# Patient Record
Sex: Female | Born: 1955
Health system: Southern US, Community
[De-identification: ages and names within clinical notes are randomized; demographics above are authoritative.]

## PROBLEM LIST (undated history)

## (undated) DIAGNOSIS — E119 Type 2 diabetes mellitus without complications: Secondary | ICD-10-CM

---

## 2015-04-30 ENCOUNTER — Encounter (HOSPITAL_COMMUNITY): Payer: Self-pay | Admitting: *Deleted

## 2015-04-30 ENCOUNTER — Emergency Department (HOSPITAL_COMMUNITY)
Admission: EM | Admit: 2015-04-30 | Discharge: 2015-04-30 | Disposition: A | Discharge status: Home / Self Care | Payer: 59 | Attending: Emergency Medicine | Admitting: Emergency Medicine

## 2015-04-30 ENCOUNTER — Emergency Department (HOSPITAL_COMMUNITY): Payer: 59

## 2015-04-30 DIAGNOSIS — E119 Type 2 diabetes mellitus without complications: Secondary | ICD-10-CM | POA: Diagnosis not present

## 2015-04-30 DIAGNOSIS — X58XXXA Exposure to other specified factors, initial encounter: Secondary | ICD-10-CM | POA: Insufficient documentation

## 2015-04-30 DIAGNOSIS — R197 Diarrhea, unspecified: Secondary | ICD-10-CM | POA: Diagnosis not present

## 2015-04-30 DIAGNOSIS — T383X5A Adverse effect of insulin and oral hypoglycemic [antidiabetic] drugs, initial encounter: Secondary | ICD-10-CM | POA: Diagnosis not present

## 2015-04-30 DIAGNOSIS — T50905A Adverse effect of unspecified drugs, medicaments and biological substances, initial encounter: Secondary | ICD-10-CM

## 2015-04-30 DIAGNOSIS — R531 Weakness: Secondary | ICD-10-CM | POA: Diagnosis present

## 2015-04-30 DIAGNOSIS — E860 Dehydration: Secondary | ICD-10-CM

## 2015-04-30 HISTORY — DX: Type 2 diabetes mellitus without complications: E11.9

## 2015-04-30 LAB — CBC
HCT: 37.2 % (ref 36.0–46.0)
HEMOGLOBIN: 12 g/dL (ref 12.0–15.0)
MCH: 23.3 pg — AB (ref 26.0–34.0)
MCHC: 32.3 g/dL (ref 30.0–36.0)
MCV: 72.2 fL — ABNORMAL LOW (ref 78.0–100.0)
PLATELETS: 316 10*3/uL (ref 150–400)
RBC: 5.15 MIL/uL — AB (ref 3.87–5.11)
RDW: 12.8 % (ref 11.5–15.5)
WBC: 11.6 10*3/uL — AB (ref 4.0–10.5)

## 2015-04-30 LAB — COMPREHENSIVE METABOLIC PANEL
ALBUMIN: 3.4 g/dL — AB (ref 3.5–5.0)
ALT: 21 U/L (ref 14–54)
ANION GAP: 10 (ref 5–15)
AST: 25 U/L (ref 15–41)
Alkaline Phosphatase: 66 U/L (ref 38–126)
BILIRUBIN TOTAL: 1 mg/dL (ref 0.3–1.2)
BUN: 7 mg/dL (ref 6–20)
CHLORIDE: 99 mmol/L — AB (ref 101–111)
CO2: 23 mmol/L (ref 22–32)
Calcium: 9.1 mg/dL (ref 8.9–10.3)
Creatinine, Ser: 0.57 mg/dL (ref 0.44–1.00)
GFR calc Af Amer: >60 mL/min (ref >60)
GFR calc non Af Amer: >60 mL/min (ref >60)
GLUCOSE: 219 mg/dL — AB (ref 65–99)
POTASSIUM: 3.8 mmol/L (ref 3.5–5.1)
SODIUM: 132 mmol/L — AB (ref 135–145)
TOTAL PROTEIN: 7.8 g/dL (ref 6.5–8.1)

## 2015-04-30 LAB — URINALYSIS, ROUTINE W REFLEX MICROSCOPIC
Bilirubin Urine: NEGATIVE
GLUCOSE, UA: NEGATIVE mg/dL
Hgb urine dipstick: NEGATIVE
KETONES UR: NEGATIVE mg/dL
NITRITE: NEGATIVE
PROTEIN: NEGATIVE mg/dL
Specific Gravity, Urine: 1.019 (ref 1.005–1.030)
UROBILINOGEN UA: 0.2 mg/dL (ref 0.0–1.0)
pH: 6 (ref 5.0–8.0)

## 2015-04-30 LAB — I-STAT CREATININE, ED: Creatinine, Ser: 0.7 mg/dL (ref 0.44–1.00)

## 2015-04-30 LAB — I-STAT CG4 LACTIC ACID, ED
LACTIC ACID, VENOUS: 1.01 mmol/L (ref 0.5–2.0)
Lactic Acid, Venous: 1.31 mmol/L (ref 0.5–2.0)

## 2015-04-30 LAB — CBG MONITORING, ED: GLUCOSE-CAPILLARY: 213 mg/dL — AB (ref 65–99)

## 2015-04-30 LAB — URINE MICROSCOPIC-ADD ON

## 2015-04-30 MED ORDER — SODIUM CHLORIDE 0.9 % IV BOLUS (SEPSIS)
1000.0000 mL | Freq: Once | INTRAVENOUS | Status: AC
  Administered 2015-04-30: 1000 mL via INTRAVENOUS

## 2015-04-30 NOTE — ED Notes (Signed)
MD at bedside. 

## 2015-04-30 NOTE — ED Provider Notes (Signed)
CSN: [id number]     Arrival date & time 04/30/15  1129 History   First MD Initiated Contact with Patient 04/30/15 1238     Chief Complaint  Patient presents with  . Weakness  . Dizziness     (Consider location/radiation/quality/duration/timing/severity/associated sxs/prior Treatment) Patient is a 59 y.o. female presenting with weakness. The history is provided by a relative. The history is limited by the condition of the patient.  Weakness This is a new problem. The current episode started in the past 7 days. The problem occurs constantly. The problem has been unchanged. Associated symptoms include a change in bowel habit, fatigue and weakness. Pertinent negatives include no abdominal pain, anorexia, arthralgias, chest pain, chills, congestion, coughing, diaphoresis, fever, headaches, joint swelling, myalgias, nausea, neck pain, numbness, rash, sore throat, swollen glands, urinary symptoms, vertigo, visual change or vomiting. The symptoms are aggravated by exertion. She has tried rest for the symptoms. The treatment provided no relief.    Past Medical History  Diagnosis Date  . Diabetes mellitus without complication (HCC)    History reviewed. No pertinent past surgical history. No family history on file. Social History  Substance Use Topics  . Smoking status: Never Smoker   . Smokeless tobacco: None  . Alcohol Use: No   OB History    No data available     Review of Systems  Constitutional: Positive for fatigue. Negative for fever, chills and diaphoresis.  HENT: Negative for congestion, facial swelling and sore throat.   Respiratory: Negative for cough and shortness of breath.   Cardiovascular: Negative for chest pain.  Gastrointestinal: Positive for diarrhea and change in bowel habit. Negative for nausea, vomiting, abdominal pain, constipation, blood in stool, abdominal distention, anal bleeding and anorexia.  Genitourinary: Negative for dysuria.  Musculoskeletal: Negative for  myalgias, back pain, joint swelling, arthralgias and neck pain.  Skin: Negative for rash.  Neurological: Positive for dizziness, weakness and light-headedness. Negative for vertigo, numbness and headaches.  Psychiatric/Behavioral: Negative for confusion.      Allergies  Review of patient's allergies indicates no known allergies.  Home Medications   Prior to Admission medications   Not on File   BP 143/65 mmHg  Pulse 77  Temp(Src) 98.2 F (36.8 C) (Oral)  Resp 16  Wt 99 lb 8 oz (45.133 kg)  SpO2 99% Physical Exam  Constitutional: She is oriented to person, place, and time. She appears well-developed and well-nourished. No distress.  HENT:  Head: Normocephalic and atraumatic.  Right Ear: External ear normal.  Left Ear: External ear normal.  Nose: Nose normal.  Mouth/Throat: Oropharynx is clear and moist. No oropharyngeal exudate.  Eyes: Conjunctivae and EOM are normal. Pupils are equal, round, and reactive to light. Right eye exhibits no discharge. Left eye exhibits no discharge. No scleral icterus.  Neck: Normal range of motion. Neck supple. No JVD present. No tracheal deviation present. No thyromegaly present.  Cardiovascular: Normal rate, regular rhythm and intact distal pulses.   Pulmonary/Chest: Effort normal and breath sounds normal. No stridor. No respiratory distress. She has no wheezes. She has no rales. She exhibits no tenderness.  Abdominal: Soft. She exhibits no distension. There is no tenderness.  Musculoskeletal: Normal range of motion. She exhibits no edema or tenderness.  Lymphadenopathy:    She has no cervical adenopathy.  Neurological: She is alert and oriented to person, place, and time.  Skin: Skin is warm and dry. No rash noted. She is not diaphoretic. No erythema. There is pallor.  Psychiatric: She  has a normal mood and affect. Her behavior is normal. Judgment and thought content normal.  Nursing note and vitals reviewed.   ED Course  Procedures  (including critical care time) Labs Review Labs Reviewed  CBC - Abnormal; Notable for the following:    WBC 11.6 (*)    RBC 5.15 (*)    MCV 72.2 (*)    MCH 23.3 (*)    All other components within normal limits  URINALYSIS, ROUTINE W REFLEX MICROSCOPIC (NOT AT [facility]) - Abnormal; Notable for the following:    Color, Urine AMBER (*)    APPearance CLOUDY (*)    Leukocytes, UA MODERATE (*)    All other components within normal limits  COMPREHENSIVE METABOLIC PANEL - Abnormal; Notable for the following:    Sodium 132 (*)    Chloride 99 (*)    Glucose, Bld 219 (*)    Albumin 3.4 (*)    All other components within normal limits  URINE MICROSCOPIC-ADD ON - Abnormal; Notable for the following:    Squamous Epithelial / LPF FEW (*)    All other components within normal limits  CBG MONITORING, ED - Abnormal; Notable for the following:    Glucose-Capillary 213 (*)    All other components within normal limits  I-STAT CREATININE, ED  I-STAT CG4 LACTIC ACID, ED  I-STAT CG4 LACTIC ACID, ED    Imaging Review Dg Chest Portable 1 View  04/30/2015  CLINICAL DATA:  Weakness and dizziness for 4 days EXAM: PORTABLE CHEST 1 VIEW COMPARISON:  None. FINDINGS: Normal mediastinum and cardiac silhouette. Normal pulmonary vasculature. Subtle airspace opacity over the LEFT hemidiaphragm and partially obscuring the LEFT heart border. No pleural fluid. No pneumothorax. No pulmonary edema. IMPRESSION: LEFT upper lobe lingular opacity representing atelectasis versus early / small infiltrate. Electronically Signed   By: Genevive Bi M.D.   On: 04/30/2015 13:27   I have personally reviewed and evaluated these images and lab results as part of my medical decision-making.   EKG Interpretation   Date/Time:  Sunday April 30 2015 12:11:35 EDT Ventricular Rate:  79 PR Interval:  140 QRS Duration: 76 QT Interval:  394 QTC Calculation: 451 R Axis:   87 Text Interpretation:  Normal sinus rhythm Normal ECG No  old tracing to  compare Confirmed by GOLDSTON  MD, SCOTT ([id number]) on 04/30/2015 1:13:00 PM      MDM   Final diagnoses:  Dehydration  Diarrhea, unspecified type  Adverse effects of medication, initial encounter   Patient presenting for generalized weakness and fatigue and ongoing for the past 4-5 days. Patient recently started metformin. She eats mostly starches per family. Patient has had multiple episodes of diarrhea since starting metformin. Also has not felt like eating or drinking very much. Laboratory workup was obtained to look for metabolic or infectious abnormalities. Patient has not had any other localizing symptoms such as abdominal pain chest pain dysuria or other specifics to account for her symptoms. Patient also has not had any melena or signs of acute GI bleeding. Also no evidence of vertigo. Negative Dicks Hallpike exam. No focal neurologic deficits.  Patient was given IV fluids laboratory workup showslimited findings to account for her symptoms however she does feel better after getting IV fluid. I suspect she has a component of dehydration due to some adverse effects of her metformin combined with primarily a starchy diet of rice. Advised patient and family to have the patient stop the metformin at least for now until she can be  seen by her primary physician. Suggested dietary changes which may help. Advised for her to have a more varied diet mostly liquids for now fits all she can tolerate but to have things other than starch if her blood sugars have been a concern and if she needs to be on metformin. Advised family and patient to return promptly if she feels worse. Patient and family expressed understanding follow up plan and return precautions. All questions answered prior to discharge. Patient was discharged stable condition with family.   Patient care was discussed with my attending, Dr. Criss AlvineGoldston.     Gavin PoundJustin Kal Chait, MD 05/01/15 [id number]  Pricilla LovelessScott Goldston, MD 05/04/15 [phone]

## 2015-04-30 NOTE — ED Notes (Signed)
Pt was seen by pcp on Wed for weakness and dizziness.  Was told her cbg was in 300's and was given metformin.  Pt continues to feel weak and dizzy, so family brought her here.  CBG 213 in triage.  Pt appears lethargic and pale.

## 2015-11-20 ENCOUNTER — Ambulatory Visit (INDEPENDENT_AMBULATORY_CARE_PROVIDER_SITE_OTHER): Payer: BLUE CROSS/BLUE SHIELD | Admitting: Family Medicine

## 2015-11-20 VITALS — BP 102/66 | HR 80 | Temp 98.0°F | Resp 17 | Ht 61.5 in | Wt 108.0 lb

## 2015-11-20 DIAGNOSIS — E119 Type 2 diabetes mellitus without complications: Secondary | ICD-10-CM

## 2015-11-20 DIAGNOSIS — E785 Hyperlipidemia, unspecified: Secondary | ICD-10-CM | POA: Diagnosis not present

## 2015-11-20 DIAGNOSIS — E1169 Type 2 diabetes mellitus with other specified complication: Secondary | ICD-10-CM | POA: Insufficient documentation

## 2015-11-20 HISTORY — DX: Hyperlipidemia, unspecified: E78.5

## 2015-11-20 LAB — LIPID PANEL
Cholesterol: 227 mg/dL — ABNORMAL HIGH (ref 125–200)
HDL: 36 mg/dL — ABNORMAL LOW (ref >=46)
Total CHOL/HDL Ratio: 6.3 Ratio — ABNORMAL HIGH (ref <=5.0)
Triglycerides: 878 mg/dL — ABNORMAL HIGH (ref <150)

## 2015-11-20 LAB — COMPLETE METABOLIC PANEL WITH GFR
ALT: 14 U/L (ref 6–29)
AST: 16 U/L (ref 10–35)
Albumin: 4.1 g/dL (ref 3.6–5.1)
Alkaline Phosphatase: 78 U/L (ref 33–130)
BUN: 13 mg/dL (ref 7–25)
CO2: 26 mmol/L (ref 20–31)
Calcium: 9.4 mg/dL (ref 8.6–10.4)
Chloride: 96 mmol/L — ABNORMAL LOW (ref 98–110)
Creat: 0.76 mg/dL (ref 0.50–0.99)
GFR, Est African American: >89 mL/min (ref >=60)
GFR, Est Non African American: 85 mL/min (ref >=60)
Glucose, Bld: 424 mg/dL — ABNORMAL HIGH (ref 65–99)
Potassium: 4.7 mmol/L (ref 3.5–5.3)
Sodium: 134 mmol/L — ABNORMAL LOW (ref 135–146)
Total Bilirubin: 0.6 mg/dL (ref 0.2–1.2)
Total Protein: 7.9 g/dL (ref 6.1–8.1)

## 2015-11-20 LAB — GLUCOSE, POCT (MANUAL RESULT ENTRY): POC Glucose: >444 mg/dl (ref 70–99)

## 2015-11-20 LAB — POCT GLYCOSYLATED HEMOGLOBIN (HGB A1C): Hemoglobin A1C: 9.6

## 2015-11-20 MED ORDER — SIMVASTATIN 10 MG PO TABS: 10.0000 mg | ORAL_TABLET | Freq: Every day | ORAL | Status: DC

## 2015-11-20 MED ORDER — GLIMEPIRIDE 4 MG PO TABS: 4.0000 mg | ORAL_TABLET | Freq: Every day | ORAL | Status: DC

## 2015-11-20 NOTE — Patient Instructions (Addendum)
  Please return at the end of the week so we can make sure the sugar has come back down.   IF you received an x-ray today, you will receive an invoice from [facility]. Please contact [facility] at [phone] with questions or concerns regarding your invoice.   IF you received labwork today, you will receive an invoice from [facility]. Please contact [facility] at [phone] with questions or concerns regarding your invoice.   Our billing staff will not be able to assist you with questions regarding bills from these companies.  You will be contacted with the lab results as soon as they are available. The fastest way to get your results is to activate your My Chart account. Instructions are located on the last page of this paperwork. If you have not heard from us regarding the results in 2 weeks, please contact this office.

## 2015-11-20 NOTE — Progress Notes (Signed)
This is a 60 year old Asian woman who does not speak [place]. She's coming by her husband who does.  She's had diabetes since last September. She was put on glimepiride 4 mg daily and simvastatin 10 mg daily. She also takes fish oil. She ran out of her medicines about 3 weeks ago.  Patient denies vision problems, polyuria. She does have some dizziness On occasion.  Objective:BP 102/66 mmHg  Pulse 80  Temp(Src) 98 F (36.7 C) (Oral)  Resp 17  Ht 5' 1.5" (1.562 m)  Wt 108 lb (48.988 kg)  BMI 20.08 kg/m2  SpO2 98% Patient's in no acute distress  Results for orders placed or performed during the hospital encounter of 04/30/15  CBC  Result Value Ref Range   WBC 11.6 (H) 4.0 - 10.5 K/uL   RBC 5.15 (H) 3.87 - 5.11 MIL/uL   Hemoglobin 12.0 12.0 - 15.0 g/dL   HCT 40.9 81.1 - 91.4 %   MCV 72.2 (L) 78.0 - 100.0 fL   MCH 23.3 (L) 26.0 - 34.0 pg   MCHC 32.3 30.0 - 36.0 g/dL   RDW 78.2 95.6 - 21.3 %   Platelets 316 150 - 400 K/uL  Urinalysis, Routine w reflex microscopic (not at [facility])  Result Value Ref Range   Color, Urine AMBER (A) YELLOW   APPearance CLOUDY (A) CLEAR   Specific Gravity, Urine 1.019 1.005 - 1.030   pH 6.0 5.0 - 8.0   Glucose, UA NEGATIVE NEGATIVE mg/dL   Hgb urine dipstick NEGATIVE NEGATIVE   Bilirubin Urine NEGATIVE NEGATIVE   Ketones, ur NEGATIVE NEGATIVE mg/dL   Protein, ur NEGATIVE NEGATIVE mg/dL   Urobilinogen, UA 0.2 0.0 - 1.0 mg/dL   Nitrite NEGATIVE NEGATIVE   Leukocytes, UA MODERATE (A) NEGATIVE  Comprehensive metabolic panel  Result Value Ref Range   Sodium 132 (L) 135 - 145 mmol/L   Potassium 3.8 3.5 - 5.1 mmol/L   Chloride 99 (L) 101 - 111 mmol/L   CO2 23 22 - 32 mmol/L   Glucose, Bld 219 (H) 65 - 99 mg/dL   BUN 7 6 - 20 mg/dL   Creatinine, Ser 0.86 0.44 - 1.00 mg/dL   Calcium 9.1 8.9 - 57.8 mg/dL   Total Protein 7.8 6.5 - 8.1 g/dL   Albumin 3.4 (L) 3.5 - 5.0 g/dL   AST 25 15 - 41 U/L   ALT 21 14 - 54 U/L   Alkaline Phosphatase 66 38 - 126 U/L    Total Bilirubin 1.0 0.3 - 1.2 mg/dL   GFR calc non Af Amer >60 >60 mL/min   GFR calc Af Amer >60 >60 mL/min   Anion gap 10 5 - 15  Urine microscopic-add on  Result Value Ref Range   Squamous Epithelial / LPF FEW (A) RARE   WBC, UA 3-6 <3 WBC/hpf  CBG monitoring, ED  Result Value Ref Range   Glucose-Capillary 213 (H) 65 - 99 mg/dL  I-Stat Creatinine, ED (do not order at [facility])  Result Value Ref Range   Creatinine, Ser 0.70 0.44 - 1.00 mg/dL  I-Stat CG4 Lactic Acid, ED  Result Value Ref Range   Lactic Acid, Venous 1.31 0.5 - 2.0 mmol/L  I-Stat CG4 Lactic Acid, ED  Result Value Ref Range   Lactic Acid, Venous 1.01 0.5 - 2.0 mmol/L   Results for orders placed or performed in visit on 11/20/15  POCT glycosylated hemoglobin (Hb A1C)  Result Value Ref Range   Hemoglobin A1C 9.6   POCT glucose (manual  entry)  Result Value Ref Range   POC Glucose >444 70 - 99 mg/dl    Type 2 diabetes mellitus without complication, without long-term current use of insulin (HCC) - Plan: POCT glycosylated hemoglobin (Hb A1C), POCT glucose (manual entry), COMPLETE METABOLIC PANEL WITH GFR  Hyperlipidemia - Plan: Lipid panel  Elvina SidleKurt Rema Lievanos, MD

## 2015-11-21 ENCOUNTER — Telehealth: Payer: Self-pay

## 2015-11-21 ENCOUNTER — Ambulatory Visit (INDEPENDENT_AMBULATORY_CARE_PROVIDER_SITE_OTHER): Payer: BLUE CROSS/BLUE SHIELD | Admitting: Family Medicine

## 2015-11-21 VITALS — BP 104/60 | HR 89 | Temp 98.0°F | Resp 16 | Ht 61.0 in | Wt 109.2 lb

## 2015-11-21 DIAGNOSIS — E119 Type 2 diabetes mellitus without complications: Secondary | ICD-10-CM | POA: Diagnosis not present

## 2015-11-21 LAB — GLUCOSE, POCT (MANUAL RESULT ENTRY): POC Glucose: 293 mg/dl — AB (ref 70–99)

## 2015-11-21 NOTE — Telephone Encounter (Signed)
Patient is here at clinic at this time for follow up.  Message closed.

## 2015-11-21 NOTE — Progress Notes (Signed)
60 year old woman who is here yesterday because she had run out of her diabetes medicine for 3 weeks and she's becoming dizzy. Over the last 24 hours she feels much better. She is eating vegetables and avoiding sugary foods and drinks.  She still has some disequilibrium.  Objective: Patient's alert, and cooperative. Husband does most of the interpreting for her.  BP 104/60 mmHg  Pulse 89  Temp(Src) 98 F (36.7 C) (Oral)  Resp 16  Ht 5\' 1"  (1.549 m)  Wt 109 lb 3.2 oz (49.533 kg)  BMI 20.64 kg/m2  SpO2 97% Results for orders placed or performed in visit on 11/21/15  POCT glucose (manual entry)  Result Value Ref Range   POC Glucose 293 (A) 70 - 99 mg/dl   Assessment:  Significant improvement     plan: Recheck in one week.  Signed, Sheila OatsKurt Lazlo Tunney M.D.

## 2015-11-21 NOTE — Patient Instructions (Addendum)
  Blood sugar is still high. It's improving. Please return in one week for follow-up or see your primary care doctor

## 2015-11-21 NOTE — Telephone Encounter (Signed)
[facility] called and stated the pt's glucose is 424 and verified.

## 2015-11-28 ENCOUNTER — Ambulatory Visit (INDEPENDENT_AMBULATORY_CARE_PROVIDER_SITE_OTHER): Payer: BLUE CROSS/BLUE SHIELD | Admitting: Family Medicine

## 2015-11-28 VITALS — BP 116/76 | HR 77 | Temp 97.9°F | Resp 16 | Ht 61.0 in | Wt 111.0 lb

## 2015-11-28 DIAGNOSIS — R42 Dizziness and giddiness: Secondary | ICD-10-CM

## 2015-11-28 DIAGNOSIS — E1165 Type 2 diabetes mellitus with hyperglycemia: Secondary | ICD-10-CM | POA: Diagnosis not present

## 2015-11-28 DIAGNOSIS — IMO0001 Reserved for inherently not codable concepts without codable children: Secondary | ICD-10-CM

## 2015-11-28 LAB — POCT CBC
Granulocyte percent: 62.6 %G (ref 37–80)
HCT, POC: 39 % (ref 37.7–47.9)
Hemoglobin: 12.9 g/dL (ref 12.2–16.2)
LYMPH, POC: 3.4 (ref 0.6–3.4)
MCH: 24.2 pg — AB (ref 27–31.2)
MCHC: 33.2 g/dL (ref 31.8–35.4)
MCV: 72.9 fL — AB (ref 80–97)
MID (CBC): 0.5 (ref 0–0.9)
MPV: 7.9 fL (ref 0–99.8)
POC Granulocyte: 6.5 (ref 2–6.9)
POC LYMPH %: 32.4 % (ref 10–50)
POC MID %: 5 % (ref 0–12)
Platelet Count, POC: 263 10*3/uL (ref 142–424)
RBC: 5.35 M/uL (ref 4.04–5.48)
RDW, POC: 12.5 %
WBC: 10.4 10*3/uL — AB (ref 4.6–10.2)

## 2015-11-28 LAB — BASIC METABOLIC PANEL
BUN: 12 mg/dL (ref 7–25)
CALCIUM: 9.3 mg/dL (ref 8.6–10.4)
CHLORIDE: 98 mmol/L (ref 98–110)
CO2: 26 mmol/L (ref 20–31)
Creat: 0.84 mg/dL (ref 0.50–0.99)
Glucose, Bld: 319 mg/dL — ABNORMAL HIGH (ref 65–99)
Potassium: 4.4 mmol/L (ref 3.5–5.3)
SODIUM: 133 mmol/L — AB (ref 135–146)

## 2015-11-28 LAB — GLUCOSE, POCT (MANUAL RESULT ENTRY): POC GLUCOSE: 319 mg/dL — AB (ref 70–99)

## 2015-11-28 MED ORDER — METFORMIN HCL 500 MG PO TABS: 500.0000 mg | ORAL_TABLET | Freq: Two times a day (BID) | ORAL | Status: DC

## 2015-11-28 NOTE — Progress Notes (Addendum)
By signing my name below I, [name], attest that this documentation has been prepared under the direction and in the presence of [name], MD. Electonically Signed. [name], Scribe 11/28/2015 at 10:20 AM   Subjective:    Patient ID: Cathy Robbins, female    DOB: May 24, 1956, 60 y.o.   MRN: [id number]  Chief Complaint  Patient presents with  . Blood Sugar Problem    HPI Cathy Robbins is a 60 y.o. female who presents to the [facility] complaining of blood sugar control issues.   Pt was evaluated at [facility] 8 days ago and again 7 days ago after the pt had been out of her DM medication for 3 weeks and was c/o dizziness. Pt's blood sugar at first visit was 424 and A1C was 9.3. Pt;s CO2 normal on CMP. Pt was restarted on amaryl 4mg  QD and zocor for her HLD. Pt's blood sugar was 293 at follow up visit. Pt felt improved upon second visit and was advised to follow up in a week.   Pt states she has been taking the amaryl faithfully. Pt still c/o dizziness, improved from last week. Pt denies any falls. Pt has been drinking plenty of fluids. Pt denies any vision changes. Pt denies any focal weakness, generalized weakness, numbness, or tingling, or heart palpitations.   Pt also takes lisinopril 10mg  daily for her HTN.   Pt's husband present at visit and translating for pt who does not speak english.    Patient Active Problem List   Diagnosis Date Noted  . Type 2 diabetes mellitus without complication, without long-term current use of insulin (HCC) 11/20/2015  . Hyperlipidemia 11/20/2015   Past Medical History  Diagnosis Date  . Diabetes mellitus without complication (HCC)    No past surgical history on file. No Known Allergies Prior to Admission medications   Medication Sig Start Date End Date Taking? Authorizing Provider  glimepiride (AMARYL) 4 MG tablet Take 1 tablet (4 mg total) by mouth daily with breakfast. 11/20/15  Yes Elvina Sidle, MD  lisinopril  (PRINIVIL,ZESTRIL) 10 MG tablet Take 10 mg by mouth daily.   Yes Historical Provider, MD  Omega-3 Fatty Acids (FISH OIL) 1000 MG CAPS Take by mouth.   Yes Historical Provider, MD  simvastatin (ZOCOR) 10 MG tablet Take 1 tablet (10 mg total) by mouth daily. 11/20/15  Yes Elvina Sidle, MD   Social History   Social History  . Marital Status: Married    Spouse Name: N/A  . Number of Children: N/A  . Years of Education: N/A   Occupational History  . Not on file.   Social History Main Topics  . Smoking status: Never Smoker   . Smokeless tobacco: Not on file  . Alcohol Use: No  . Drug Use: No  . Sexual Activity: Not on file   Other Topics Concern  . Not on file   Social History Narrative      Review of Systems  Constitutional: Negative for fever, fatigue and unexpected weight change.  Respiratory: Negative for chest tightness and shortness of breath.   Cardiovascular: Negative for chest pain, palpitations and leg swelling.  Gastrointestinal: Negative for abdominal pain and blood in stool.  Neurological: Positive for dizziness. Negative for syncope, weakness, light-headedness, numbness and headaches.       Objective:   Physical Exam  Constitutional: She is oriented to person, place, and time. She appears well-developed and well-nourished.  HENT:  Head: Normocephalic and atraumatic.  Eyes: Conjunctivae and EOM are normal. Pupils are equal, round, and reactive to light.  Neck: Carotid bruit is not present.  Cardiovascular: Normal rate, regular rhythm, normal heart sounds and intact distal pulses.   Pulmonary/Chest: Effort normal and breath sounds normal.  Abdominal: Soft. She exhibits no pulsatile midline mass. There is no tenderness.  Musculoskeletal: Normal range of motion.  Pt has equal grip strength bilat.  Neurological: She is alert and oriented to person, place, and time. She has normal strength. No cranial nerve deficit or sensory deficit. She displays a negative  Romberg sign. Gait normal.  Equal face movements. No facial droop. No pronator drift. Normal heel to toe walk. No focal deficits.  Skin: Skin is warm and dry.  Psychiatric: She has a normal mood and affect. Her behavior is normal.  Vitals reviewed.   Filed Vitals:   11/28/15 0914  BP: 116/76  Pulse: 77  Temp: 97.9 F (36.6 C)  TempSrc: Oral  Resp: 16  Height: 5\' 1"  (1.549 m)  Weight: 111 lb (50.349 kg)  SpO2: 97%   EKG showed sinus rhythm with no acute changes.   Results for orders placed or performed in visit on 11/28/15  POCT CBC  Result Value Ref Range   WBC 10.4 (A) 4.6 - 10.2 K/uL   Lymph, poc 3.4 0.6 - 3.4   POC LYMPH PERCENT 32.4 10 - 50 %L   MID (cbc) 0.5 0 - 0.9   POC MID % 5.0 0 - 12 %M   POC Granulocyte 6.5 2 - 6.9   Granulocyte percent 62.6 37 - 80 %G   RBC 5.35 4.04 - 5.48 M/uL   Hemoglobin 12.9 12.2 - 16.2 g/dL   HCT, POC 19.1 47.8 - 47.9 %   MCV 72.9 (A) 80 - 97 fL   MCH, POC 24.2 (A) 27 - 31.2 pg   MCHC 33.2 31.8 - 35.4 g/dL   RDW, POC 29.5 %   Platelet Count, POC 263 142 - 424 K/uL   MPV 7.9 0 - 99.8 fL  POCT glucose (manual entry)  Result Value Ref Range   POC Glucose 319 (A) 70 - 99 mg/dl   Orthostatic VS for the past 24 hrs:  BP- Lying Pulse- Lying BP- Sitting Pulse- Sitting BP- Standing at 0 minutes Pulse- Standing at 0 minutes  11/28/15 1006 125/72 mmHg 72 122/74 mmHg 72 134/76 mmHg 72        Assessment & Plan:   [name] is a 60 y.o. female Uncontrolled type 2 diabetes mellitus without complication, without long-term current use of insulin (HCC) - Plan: POCT glucose (manual entry), metFORMIN (GLUCOPHAGE) 500 MG tablet  Dizziness - Plan: POCT CBC, EKG 12-Lead, Orthostatic vital signs, Basic metabolic panel  Uncontrolled diabetes. improving with lessening dizziness, but still uncontrolled. Nonfocal neuro exam and no concerning/acute findings on EKG. Not anemic.   -add metformin 500mg  BID  (initially QD), continue amaryl, lisinopril,  simvastatin same doses.  -repeat BMP.   -meter given for home monitoring, recheck in 1 week, ER precautions if worse.   -husband translating - understanding expressed.   Meds ordered this encounter  Medications  . metFORMIN (GLUCOPHAGE) 500 MG tablet    Sig: Take 1 tablet (500 mg total) by mouth 2 (two) times daily with a meal. Start once per day for 3 days then increase to twice per day    Dispense:  180 tablet    Refill:  0   Patient Instructions  IF you received an x-ray today, you will receive an invoice from [facility]. Please contact [facility] at [phone] with questions or concerns regarding your invoice.   IF you received labwork today, you will receive an invoice from [facility]. Please contact [facility] at [phone] with questions or concerns regarding your invoice.   Our billing staff will not be able to assist you with questions regarding bills from these companies.  You will be contacted with the lab results as soon as they are available. The fastest way to get your results is to activate your My Chart account. Instructions are located on the last page of this paperwork. If you have not heard from us regarding the results in 2 weeks, please contact this office.     Add metformin twice per day (can start once per day for a few days, then increase to twice per day if tolerating medicine ok) Continue glimepiride once per day, lisinopril once per day, and simvastatin once per day.  Keep a record of your blood sugars outside of the office and bring them to the next office visit.  Recheck in 1 week. Return to the clinic or go to the nearest emergency room if any of your symptoms worsen or new symptoms occur.   Hyperglycemia Hyperglycemia occurs when the glucose (sugar) in your blood is too high. Hyperglycemia can happen for many reasons, but it most often happens to people who do not know they have diabetes or are  not managing their diabetes properly.  CAUSES  Whether you have diabetes or not, there are other causes of hyperglycemia. Hyperglycemia can occur when you have diabetes, but it can also occur in other situations that you might not be as aware of, such as: Diabetes  If you have diabetes and are having problems controlling your blood glucose, hyperglycemia could occur because of some of the following reasons:  Not following your meal plan.  Not taking your diabetes medications or not taking it properly.  Exercising less or doing less activity than you normally do.  Being sick. Pre-diabetes  This cannot be ignored. Before people develop Type 2 diabetes, they almost always have "pre-diabetes." This is when your blood glucose levels are higher than normal, but not yet high enough to be diagnosed as diabetes. Research has shown that some long-term damage to the body, especially the heart and circulatory system, may already be occurring during pre-diabetes. If you take action to manage your blood glucose when you have pre-diabetes, you may delay or prevent Type 2 diabetes from developing. Stress  If you have diabetes, you may be "diet" controlled or on oral medications or insulin to control your diabetes. However, you may find that your blood glucose is higher than usual in the hospital whether you have diabetes or not. This is often referred to as "stress hyperglycemia." Stress can elevate your blood glucose. This happens because of hormones put out by the body during times of stress. If stress has been the cause of your high blood glucose, it can be followed regularly by your caregiver. That way he/she can make sure your hyperglycemia does not continue to get worse or progress to diabetes. Steroids  Steroids are medications that act on the infection fighting system (immune system) to block inflammation or infection. One side effect can be a rise in blood glucose. Most people can produce enough extra  insulin to allow for this rise, but for those who cannot, steroids make blood glucose levels go  even higher. It is not unusual for steroid treatments to "uncover" diabetes that is developing. It is not always possible to determine if the hyperglycemia will go away after the steroids are stopped. A special blood test called an A1c is sometimes done to determine if your blood glucose was elevated before the steroids were started. SYMPTOMS  Thirsty.  Frequent urination.  Dry mouth.  Blurred vision.  Tired or fatigue.  Weakness.  Sleepy.  Tingling in feet or leg. DIAGNOSIS  Diagnosis is made by monitoring blood glucose in one or all of the following ways:  A1c test. This is a chemical found in your blood.  Fingerstick blood glucose monitoring.  Laboratory results. TREATMENT  First, knowing the cause of the hyperglycemia is important before the hyperglycemia can be treated. Treatment may include, but is not be limited to:  Education.  Change or adjustment in medications.  Change or adjustment in meal plan.  Treatment for an illness, infection, etc.  More frequent blood glucose monitoring.  Change in exercise plan.  Decreasing or stopping steroids.  Lifestyle changes. HOME CARE INSTRUCTIONS   Test your blood glucose as directed.  Exercise regularly. Your caregiver will give you instructions about exercise. Pre-diabetes or diabetes which comes on with stress is helped by exercising.  Eat wholesome, balanced meals. Eat often and at regular, fixed times. Your caregiver or nutritionist will give you a meal plan to guide your sugar intake.  Being at an ideal weight is important. If needed, losing as little as 10 to 15 pounds may help improve blood glucose levels. SEEK MEDICAL CARE IF:   You have questions about medicine, activity, or diet.  You continue to have symptoms (problems such as increased thirst, urination, or weight gain). SEEK IMMEDIATE MEDICAL CARE IF:    You are vomiting or have diarrhea.  Your breath smells fruity.  You are breathing faster or slower.  You are very sleepy or incoherent.  You have numbness, tingling, or pain in your feet or hands.  You have chest pain.  Your symptoms get worse even though you have been following your caregiver's orders.  If you have any other questions or concerns.   This information is not intended to replace advice given to you by your health care provider. Make sure you discuss any questions you have with your health care provider.   Document Released: 12/11/2000 Document Revised: 09/09/2011 Document Reviewed: 02/21/2015 Elsevier Interactive Patient Education 2016 Elsevier Inc.   [place] m?t (Dizziness) Chng m?t l m?t v?n ?? ph? bi?n. ? l c?m gic khng ?n ??nh ho?c chong vng. Qu v? c th? c?m th?y nh? s?p ng?t. Chng m?t c th? d?n ??n ch?n th??ng n?u qu v? v?p ho?c t ng. B?t k? ai c?ng c th? b? chng m?t nh?ng chng m?t ph? bi?n h?n ? ng??i l?n. Tnh tr?ng ny c th? do m?t s? nguyn nhn, bao g?m dng thu?c, b? m?t n??c, ho?c b? ?m. H??NG D?N CH?M [place] T?I NH Th?c hi?n nh?ng b??c sau c th? gip gi?m tnh tr?ng ny. ?n v u?ng  U?ng ?? n??c ?? gi? cho n??c ti?u trong ho?c c mu vng nh?t. Vi?c ny gi? cho c? th? qu v? khng b? m?t n??c. C? g?ng u?ng nhi?u dung d?ch trong su?t, ch?ng h?n nh? n??c.  Khng u?ng r??u.  H?n ch? dng caffein n?u c ch? d?n c?a chuyn gia ch?m [place] s?c kh?e.  H?n ch? dng mu?i n?u c ch? d?n c?a chuyn gia ch?m [place]  s?c kh?e. Ho?t ??ng  [place] cc ??ng tc nhanh.  T? ??ng d?y th?t ch?m v v?ng ch?c cho ??n khi qu v? c?m th?y khng c v?n ?? g.  Vo bu?i sng, ??u tin l ng?i d?y ? bn c?nh gi??ng. Khi qu v? c?m th?y ?n ? t? th? ny, hy ??ng d?y ch?m d?y trong khi n?m l?y m?t ci g ? cho ??n khi qu v? th?y th?ng b?ng.  C? ??ng chn th??ng xuyn n?u qu v? c?n ??ng ? m?t n?i trong m?t th?i gian di. Co v th? gin cc c? b?p ? chn qu v?  khi ??ng.  Khng li xe ho?c v?n hnh my mc n?ng n?u qu v? c?m th?y chng m?t.  Trnh ci ng??i n?u qu v? c?m th?y chng m?t. ?? cc v?t d?ng trong nh sao cho c th? d? dng v?i t?i chng m khng c?n ci ng??i. L?i s?ng  Khng s? d?ng b?t c? cc s?n ph?m thu?c l no, bao g?m thu?c l d?ng ht, thu?c l d?ng nhai ho?c thu?c l ?i?n t?. N?u qu v? c?n gip ?? ?? cai thu?c, hy h?i chuyn gia ch?m [place] s?c kh?e.  C? g?ng gi?m c?ng th?ng, ch?ng h?n nh? t?p yoga ho?c thi?n. Ni v?i chuyn gia ch?m [place] s?c kh?e n?u qu v? c?n gip ??. H??ng d?n chung  Theo di tnh tr?ng chng m?t c?a qu v? ?? pht hi?n b?t k? thay ??i no.  Ch? s? d?ng thu?c theo ch? d?n c?a chuyn gia ch?m [place] s?c kh?e. Ni chuy?n v?i chuyn gia ch?m [place] s?c kh?e n?u qu v? ngh? r?ng tnh tr?ng chng m?t l do vi?c qu v? dng thu?c gy ra.  Ni v?i m?t ng??i b?n ho?c m?t ng??i trong gia ?nh r?ng qu v? ?ang b? chng m?t. N?u h? nh?n th?y qu v? c thay ??i hnh vi, hy b?o h? g?i cho chuyn gia ch?m [place] s?c kh?e.  Tun th? t?t c? cc cu?c h?n khm l?i theo ch? d?n c?a chuyn gia ch?m [place] s?c kh?e. ?i?u ny c vai tr quan tr?ng. ?I K[facility] N?U:  Tnh tr?ng chng m?t khng m?t ?i.  Qu v? b? hoa m?t ho?c chng m?t n?ng h?n.  Qu v? c?m th?y bu?n nn.  Qu v? b? gi?m thnh l?c.  Qu v? c cc tri?u ch?ng m?i.  Qu v? khng ??ng v?ng ???c ho?c qu v? c?m th?y nh? c?n phng ?ang quay. NGAY L?P T?C ?I K[facility] N?U:  Qu v? nn ho?c tiu ch?y v khng th? ?n ho?c u?ng ???c th? g.  Qu v? g?p kh kh?n trong vi?c ni, ?i b?, nu?t ho?c s? d?ng cnh tay, bn tay ho?c chn.  Qu v? c?m th?y y?u ton thn.  Qu v? suy ngh? khng r rng ho?c kh ??t cu. M?t ng??i b?n ho?c ng??i trong gia ?nh c th? nh?n th?y ?i?u ny.  Qu v? b? ?au ng?c, ?au b?ng, kh th? ho?c v m? hi.  Th? l?c c?a qu v? thay ??i.  Qu v? th?y b?t k? ch? ch?y mu no.  Qu v? b? ?au ??u.  Qu v? b? ?au c? ho?c c? c?ng.  Qu v? b? s?t.    Thng tin ny khng nh?m m?c ?ch thay th? cho l?i khuyn m chuyn gia ch?m [place] s?c kh?e ni v?i qu v?. Hy b?o ??m qu v? ph?i th?o lu?n b?t k? v?n ?? g m qu v? c v?i chuyn gia ch?m [place] s?c kh?e  c?a qu v?.   Document Released: 06/06/2011 Document Revised: 11/01/2014 Elsevier Interactive Patient Education [employer]     I personally performed the services described in this documentation, which was scribed in my presence. The recorded information has been reviewed and considered, and addended by me as needed.   Signed,   Meredith Staggers, MD [facility].  11/28/2015 12:21 PM

## 2015-11-28 NOTE — Patient Instructions (Addendum)
IF you received an x-ray today, you will receive an invoice from [facility]. Please contact [facility] at [phone] with questions or concerns regarding your invoice.   IF you received labwork today, you will receive an invoice from [employer]. Please contact [facility] at [phone] with questions or concerns regarding your invoice.   Our billing staff will not be able to assist you with questions regarding bills from these companies.  You will be contacted with the lab results as soon as they are available. The fastest way to get your results is to activate your My Chart account. Instructions are located on the last page of this paperwork. If you have not heard from [place] regarding the results in 2 weeks, please contact this office.     Add metformin twice per day (can start once per day for a few days, then increase to twice per day if tolerating medicine ok) Continue glimepiride once per day, lisinopril once per day, and simvastatin once per day.  Keep a record of your blood sugars outside of the office and bring them to the next office visit.  Recheck in 1 week. Return to the clinic or go to the nearest emergency room if any of your symptoms worsen or new symptoms occur.   Hyperglycemia Hyperglycemia occurs when the glucose (sugar) in your blood is too high. Hyperglycemia can happen for many reasons, but it most often happens to people who do not know they have diabetes or are not managing their diabetes properly.  CAUSES  Whether you have diabetes or not, there are other causes of hyperglycemia. Hyperglycemia can occur when you have diabetes, but it can also occur in other situations that you might not be as aware of, such as: Diabetes  If you have diabetes and are having problems controlling your blood glucose, hyperglycemia could occur because of some of the following reasons:  Not following your meal plan.  Not taking your  diabetes medications or not taking it properly.  Exercising less or doing less activity than you normally do.  Being sick. Pre-diabetes  This cannot be ignored. Before people develop Type 2 diabetes, they almost always have "pre-diabetes." This is when your blood glucose levels are higher than normal, but not yet high enough to be diagnosed as diabetes. Research has shown that some long-term damage to the body, especially the heart and circulatory system, may already be occurring during pre-diabetes. If you take action to manage your blood glucose when you have pre-diabetes, you may delay or prevent Type 2 diabetes from developing. Stress  If you have diabetes, you may be "diet" controlled or on oral medications or insulin to control your diabetes. However, you may find that your blood glucose is higher than usual in the hospital whether you have diabetes or not. This is often referred to as "stress hyperglycemia." Stress can elevate your blood glucose. This happens because of hormones put out by the body during times of stress. If stress has been the cause of your high blood glucose, it can be followed regularly by your caregiver. That way he/she can make sure your hyperglycemia does not continue to get worse or progress to diabetes. Steroids  Steroids are medications that act on the infection fighting system (immune system) to block inflammation or infection. One side effect can be a rise in blood glucose. Most people can produce enough extra insulin to allow for this rise, but for those who cannot, steroids make blood glucose  levels go even higher. It is not unusual for steroid treatments to "uncover" diabetes that is developing. It is not always possible to determine if the hyperglycemia will go away after the steroids are stopped. A special blood test called an A1c is sometimes done to determine if your blood glucose was elevated before the steroids were started. SYMPTOMS  Thirsty.  Frequent  urination.  Dry mouth.  Blurred vision.  Tired or fatigue.  Weakness.  Sleepy.  Tingling in feet or leg. DIAGNOSIS  Diagnosis is made by monitoring blood glucose in one or all of the following ways:  A1c test. This is a chemical found in your blood.  Fingerstick blood glucose monitoring.  Laboratory results. TREATMENT  First, knowing the cause of the hyperglycemia is important before the hyperglycemia can be treated. Treatment may include, but is not be limited to:  Education.  Change or adjustment in medications.  Change or adjustment in meal plan.  Treatment for an illness, infection, etc.  More frequent blood glucose monitoring.  Change in exercise plan.  Decreasing or stopping steroids.  Lifestyle changes. HOME CARE INSTRUCTIONS   Test your blood glucose as directed.  Exercise regularly. Your caregiver will give you instructions about exercise. Pre-diabetes or diabetes which comes on with stress is helped by exercising.  Eat wholesome, balanced meals. Eat often and at regular, fixed times. Your caregiver or nutritionist will give you a meal plan to guide your sugar intake.  Being at an ideal weight is important. If needed, losing as little as 10 to 15 pounds may help improve blood glucose levels. SEEK MEDICAL CARE IF:   You have questions about medicine, activity, or diet.  You continue to have symptoms (problems such as increased thirst, urination, or weight gain). SEEK IMMEDIATE MEDICAL CARE IF:   You are vomiting or have diarrhea.  Your breath smells fruity.  You are breathing faster or slower.  You are very sleepy or incoherent.  You have numbness, tingling, or pain in your feet or hands.  You have chest pain.  Your symptoms get worse even though you have been following your caregiver's orders.  If you have any other questions or concerns.   This information is not intended to replace advice given to you by your health care provider. Make  sure you discuss any questions you have with your health care provider.   Document Released: 12/11/2000 Document Revised: 09/09/2011 Document Reviewed: 02/21/2015 Elsevier Interactive Patient Education 2016 Elsevier Inc.   [place] m?t (Dizziness) Chng m?t l m?t v?n ?? ph? bi?n. ? l c?m gic khng ?n ??nh ho?c chong vng. Qu v? c th? c?m th?y nh? s?p ng?t. Chng m?t c th? d?n ??n ch?n th??ng n?u qu v? v?p ho?c t ng. B?t k? ai c?ng c th? b? chng m?t nh?ng chng m?t ph? bi?n h?n ? ng??i l?n. Tnh tr?ng ny c th? do m?t s? nguyn nhn, bao g?m dng thu?c, b? m?t n??c, ho?c b? ?m. H??NG D?N CH?M [place] T?I NH Th?c hi?n nh?ng b??c sau c th? gip gi?m tnh tr?ng ny. ?n v u?ng  U?ng ?? n??c ?? gi? cho n??c ti?u trong ho?c c mu vng nh?t. Vi?c ny gi? cho c? th? qu v? khng b? m?t n??c. C? g?ng u?ng nhi?u dung d?ch trong su?t, ch?ng h?n nh? n??c.  Khng u?ng r??u.  H?n ch? dng caffein n?u c ch? d?n c?a chuyn gia ch?m [place] s?c kh?e.  H?n ch? dng mu?i n?u c ch? d?n c?a [place]  gia ch?m [place] s?c kh?e. Ho?t ??ng  [place] cc ??ng tc nhanh.  T? ??ng d?y th?t ch?m v v?ng ch?c cho ??n khi qu v? c?m th?y khng c v?n ?? g.  Vo bu?i sng, ??u tin l ng?i d?y ? bn c?nh gi??ng. Khi qu v? c?m th?y ?n ? t? th? ny, hy ??ng d?y ch?m d?y trong khi n?m l?y m?t ci g ? cho ??n khi qu v? th?y th?ng b?ng.  C? ??ng chn th??ng xuyn n?u qu v? c?n ??ng ? m?t n?i trong m?t th?i gian di. Co v th? gin cc c? b?p ? chn qu v? khi ??ng.  Khng li xe ho?c v?n hnh my mc n?ng n?u qu v? c?m th?y chng m?t.  Trnh ci ng??i n?u qu v? c?m th?y chng m?t. ?? cc v?t d?ng trong nh sao cho c th? d? dng v?i t?i chng m khng c?n ci ng??i. L?i s?ng  Khng s? d?ng b?t c? cc s?n ph?m thu?c l no, bao g?m thu?c l d?ng ht, thu?c l d?ng nhai ho?c thu?c l ?i?n t?. N?u qu v? c?n gip ?? ?? cai thu?c, hy h?i chuyn gia ch?m [place] s?c kh?e.  C? g?ng gi?m c?ng th?ng, ch?ng h?n nh? t?p  yoga ho?c thi?n. Ni v?i chuyn gia ch?m [place] s?c kh?e n?u qu v? c?n gip ??. H??ng d?n chung  Theo di tnh tr?ng chng m?t c?a qu v? ?? pht hi?n b?t k? thay ??i no.  Ch? s? d?ng thu?c theo ch? d?n c?a chuyn gia ch?m [place] s?c kh?e. Ni chuy?n v?i chuyn gia ch?m [place] s?c kh?e n?u qu v? ngh? r?ng tnh tr?ng chng m?t l do vi?c qu v? dng thu?c gy ra.  Ni v?i m?t ng??i b?n ho?c m?t ng??i trong gia ?nh r?ng qu v? ?ang b? chng m?t. N?u h? nh?n th?y qu v? c thay ??i hnh vi, hy b?o h? g?i cho chuyn gia ch?m [place] s?c kh?e.  Tun th? t?t c? cc cu?c h?n khm l?i theo ch? d?n c?a chuyn gia ch?m [place] s?c kh?e. ?i?u ny c vai tr quan tr?ng. ?I KHM N?U:  Tnh tr?ng chng m?t khng m?t ?i.  Qu v? b? hoa m?t ho?c chng m?t n?ng h?n.  Qu v? c?m th?y bu?n nn.  Qu v? b? gi?m thnh l?c.  Qu v? c cc tri?u ch?ng m?i.  Qu v? khng ??ng v?ng ???c ho?c qu v? c?m th?y nh? c?n phng ?ang quay. NGAY L?P T?C ?I KHM N?U:  Qu v? nn ho?c tiu ch?y v khng th? ?n ho?c u?ng ???c th? g.  Qu v? g?p kh kh?n trong vi?c ni, ?i b?, nu?t ho?c s? d?ng cnh tay, bn tay ho?c chn.  Qu v? c?m th?y y?u ton thn.  Qu v? suy ngh? khng r rng ho?c kh ??t cu. M?t ng??i b?n ho?c ng??i trong gia ?nh c th? nh?n th?y ?i?u ny.  Qu v? b? ?au ng?c, ?au b?ng, kh th? ho?c v m? hi.  Th? l?c c?a qu v? thay ??i.  Qu v? th?y b?t k? ch? ch?y mu no.  Qu v? b? ?au ??u.  Qu v? b? ?au c? ho?c c? c?ng.  Qu v? b? s?t.   Thng tin ny khng nh?m m?c ?ch thay th? cho l?i khuyn m chuyn gia ch?m [place] s?c kh?e ni v?i qu v?. Hy b?o ??m qu v? ph?i th?o lu?n b?t k? v?n ?? g m qu v? c v?i chuyn gia ch?m [place]  s?c kh?e c?a qu v?.   Document Released: 06/06/2011 Document Revised: 11/01/2014 Elsevier Interactive Patient Education [employer]

## 2015-12-04 ENCOUNTER — Ambulatory Visit (INDEPENDENT_AMBULATORY_CARE_PROVIDER_SITE_OTHER): Payer: BLUE CROSS/BLUE SHIELD | Admitting: Family Medicine

## 2015-12-04 VITALS — BP 103/69 | HR 68 | Temp 98.1°F | Resp 16 | Ht 61.5 in | Wt 109.0 lb

## 2015-12-04 DIAGNOSIS — I951 Orthostatic hypotension: Secondary | ICD-10-CM | POA: Diagnosis not present

## 2015-12-04 DIAGNOSIS — E1165 Type 2 diabetes mellitus with hyperglycemia: Secondary | ICD-10-CM | POA: Diagnosis not present

## 2015-12-04 DIAGNOSIS — IMO0001 Reserved for inherently not codable concepts without codable children: Secondary | ICD-10-CM

## 2015-12-04 LAB — BASIC METABOLIC PANEL
BUN: 12 mg/dL (ref 7–25)
CHLORIDE: 100 mmol/L (ref 98–110)
CO2: 25 mmol/L (ref 20–31)
CREATININE: 0.68 mg/dL (ref 0.50–0.99)
Calcium: 9.5 mg/dL (ref 8.6–10.4)
GLUCOSE: 168 mg/dL — AB (ref 65–99)
Potassium: 4.5 mmol/L (ref 3.5–5.3)
Sodium: 135 mmol/L (ref 135–146)

## 2015-12-04 LAB — GLUCOSE, POCT (MANUAL RESULT ENTRY): POC Glucose: 166 mg/dl — AB (ref 70–99)

## 2015-12-04 MED ORDER — METFORMIN HCL 500 MG PO TABS: 500.0000 mg | ORAL_TABLET | Freq: Two times a day (BID) | ORAL | Status: DC

## 2015-12-04 NOTE — Patient Instructions (Addendum)
IF you received an x-ray today, you will receive an invoice from [facility]. Please contact [facility] at [phone] with questions or concerns regarding your invoice.   IF you received labwork today, you will receive an invoice from [facility]. Please contact [facility] at [phone] with questions or concerns regarding your invoice.   Our billing staff will not be able to assist you with questions regarding bills from these companies.  You will be contacted with the lab results as soon as they are available. The fastest way to get your results is to activate your My Chart account. Instructions are located on the last page of this paperwork. If you have not heard from us regarding the results in 2 weeks, please contact this office.    In regards tot he metformin that we discussed: Begin today with 1 tablet twice a day.  In 3 days increase to 2 tablets in the AM and 1 tablet int he evening. 3 days after that increase to 2 tablets in the morning and 2 tablets in the evening.  Call with any concerns.  Do not take more than 4 tablets (2000mg ) in a day.    Hyperglycemia Hyperglycemia occurs when the glucose (sugar) in your blood is too high. Hyperglycemia can happen for many reasons, but it most often happens to people who do not know they have diabetes or are not managing their diabetes properly.  CAUSES  Whether you have diabetes or not, there are other causes of hyperglycemia. Hyperglycemia can occur when you have diabetes, but it can also occur in other situations that you might not be as aware of, such as: Diabetes  If you have diabetes and are having problems controlling your blood glucose, hyperglycemia could occur because of some of the following reasons:  Not following your meal plan.  Not taking your diabetes medications or not taking it properly.  Exercising less or doing less activity than you normally do.  Being  sick. Pre-diabetes  This cannot be ignored. Before people develop Type 2 diabetes, they almost always have "pre-diabetes." This is when your blood glucose levels are higher than normal, but not yet high enough to be diagnosed as diabetes. Research has shown that some long-term damage to the body, especially the heart and circulatory system, may already be occurring during pre-diabetes. If you take action to manage your blood glucose when you have pre-diabetes, you may delay or prevent Type 2 diabetes from developing. Stress  If you have diabetes, you may be "diet" controlled or on oral medications or insulin to control your diabetes. However, you may find that your blood glucose is higher than usual in the hospital whether you have diabetes or not. This is often referred to as "stress hyperglycemia." Stress can elevate your blood glucose. This happens because of hormones put out by the body during times of stress. If stress has been the cause of your high blood glucose, it can be followed regularly by your caregiver. That way he/she can make sure your hyperglycemia does not continue to get worse or progress to diabetes. Steroids  Steroids are medications that act on the infection fighting system (immune system) to block inflammation or infection. One side effect can be a rise in blood glucose. Most people can produce enough extra insulin to allow for this rise, but for those who cannot, steroids make blood glucose levels go even higher. It is not unusual for steroid treatments to "uncover" diabetes that is developing.  It is not always possible to determine if the hyperglycemia will go away after the steroids are stopped. A special blood test called an A1c is sometimes done to determine if your blood glucose was elevated before the steroids were started. SYMPTOMS  Thirsty.  Frequent urination.  Dry mouth.  Blurred vision.  Tired or fatigue.  Weakness.  Sleepy.  Tingling in feet or  leg. DIAGNOSIS  Diagnosis is made by monitoring blood glucose in one or all of the following ways:  A1c test. This is a chemical found in your blood.  Fingerstick blood glucose monitoring.  Laboratory results. TREATMENT  First, knowing the cause of the hyperglycemia is important before the hyperglycemia can be treated. Treatment may include, but is not be limited to:  Education.  Change or adjustment in medications.  Change or adjustment in meal plan.  Treatment for an illness, infection, etc.  More frequent blood glucose monitoring.  Change in exercise plan.  Decreasing or stopping steroids.  Lifestyle changes. HOME CARE INSTRUCTIONS   Test your blood glucose as directed.  Exercise regularly. Your caregiver will give you instructions about exercise. Pre-diabetes or diabetes which comes on with stress is helped by exercising.  Eat wholesome, balanced meals. Eat often and at regular, fixed times. Your caregiver or nutritionist will give you a meal plan to guide your sugar intake.  Being at an ideal weight is important. If needed, losing as little as 10 to 15 pounds may help improve blood glucose levels. SEEK MEDICAL CARE IF:   You have questions about medicine, activity, or diet.  You continue to have symptoms (problems such as increased thirst, urination, or weight gain). SEEK IMMEDIATE MEDICAL CARE IF:   You are vomiting or have diarrhea.  Your breath smells fruity.  You are breathing faster or slower.  You are very sleepy or incoherent.  You have numbness, tingling, or pain in your feet or hands.  You have chest pain.  Your symptoms get worse even though you have been following your caregiver's orders.  If you have any other questions or concerns.   This information is not intended to replace advice given to you by your health care provider. Make sure you discuss any questions you have with your health care provider.   Document Released: 12/11/2000  Document Revised: 09/09/2011 Document Reviewed: 02/21/2015 Elsevier Interactive Patient Education [employer]

## 2015-12-04 NOTE — Progress Notes (Signed)
12/04/2015 Subjective:    Patient ID: Cathy Robbins, female    DOB: 1955/07/03, 60 y.o.   MRN: [id number]  Chief Complaint  Patient presents with  . Follow-up    Diabetes. Pt has been keeping a record of measurings    HPI Cathy Robbins is a 60 y.o. female who presents to the [facility] complaining of blood sugar control issues.   [facility] in the last few weeks regarding her diabetes after running out of her meds.  Back on amaryl and metformin.   A1C was 9.3. Pt's CO2 normal on CMP x 2 over last 2 weeks.   Pt states she has been taking the amaryl & metformin (500mg  daily) faithfully. Pt still c/o dizziness only with standing, improved from last week. Pt denies any falls. Feels weak. Pt has been drinking plenty of fluids. Pt denies any vision changes. Pt denies any focal weakness, generalized weakness, numbness, or tingling, or heart palpitations. - Reviewed medications: currently taking metformin 500mg  only once a day due to confusion.  - Denying any sugary drinks - BS:  -- 5/31- 217 --6/1- 217 -- 6/1-285 -- 6/2- 299 -- 6/3- 323 --6/4-182   Pt also takes lisinopril 10mg  daily for her HTN.   Pt's husband present at visit and translating for pt who does not speak english.    Patient Active Problem List   Diagnosis Date Noted  . Type 2 diabetes mellitus without complication, without long-term current use of insulin (HCC) 11/20/2015  . Hyperlipidemia 11/20/2015   Past Medical History  Diagnosis Date  . Diabetes mellitus without complication (HCC)    No past surgical history on file. No Known Allergies Prior to Admission medications   Medication Sig Start Date End Date Taking? Authorizing Provider  glimepiride (AMARYL) 4 MG tablet Take 1 tablet (4 mg total) by mouth daily with breakfast. 11/20/15  Yes Elvina SidleKurt Lauenstein, MD  lisinopril (PRINIVIL,ZESTRIL) 10 MG tablet Take 10 mg by mouth daily.   Yes Historical Provider, MD  Omega-3 Fatty Acids  (FISH OIL) 1000 MG CAPS Take by mouth.   Yes Historical Provider, MD  simvastatin (ZOCOR) 10 MG tablet Take 1 tablet (10 mg total) by mouth daily. 11/20/15  Yes Elvina SidleKurt Lauenstein, MD   Social History   Social History  . Marital Status: Married    Spouse Name: N/A  . Number of Children: N/A  . Years of Education: N/A   Occupational History  . Not on file.   Social History Main Topics  . Smoking status: Never Smoker   . Smokeless tobacco: Not on file  . Alcohol Use: No  . Drug Use: No  . Sexual Activity: Not on file   Other Topics Concern  . Not on file   Social History Narrative      Review of Systems  Constitutional: Negative for fever, fatigue and unexpected weight change.  Respiratory: Negative for chest tightness and shortness of breath.   Cardiovascular: Negative for chest pain, palpitations and leg swelling.  Gastrointestinal: Negative for abdominal pain and blood in stool.  Neurological: Positive for dizziness. Negative for syncope, weakness, light-headedness, numbness and headaches.       Objective:   Physical Exam  Constitutional: She is oriented to person, place, and time. She appears well-developed and well-nourished.  HENT:  Head: Normocephalic and atraumatic.  Eyes: Conjunctivae and EOM are normal. Pupils are equal, round, and reactive to light.  Neck: Carotid bruit is not  present.  Cardiovascular: Normal rate, regular rhythm, normal heart sounds and intact distal pulses.   Pulmonary/Chest: Effort normal and breath sounds normal.  Abdominal: Soft. She exhibits no pulsatile midline mass. There is no tenderness.  Musculoskeletal: Normal range of motion.  Pt has equal grip strength bilat.  Neurological: She is alert and oriented to person, place, and time. She has normal strength. No cranial nerve deficit or sensory deficit. She displays a negative Romberg sign. Gait normal.  Equal face movements. No facial droop. No pronator drift. Normal heel to toe  walk. No focal deficits.  Skin: Skin is warm and dry.  Psychiatric: She has a normal mood and affect. Her behavior is normal.  Vitals reviewed.   No lesions on feet  Filed Vitals:   12/04/15 0817  BP: 103/69  Pulse: 68  Temp: 98.1 F (36.7 C)  TempSrc: Oral  Resp: 16  Height: 5' 1.5" (1.562 m)  Weight: 109 lb (49.442 kg)  SpO2: 98%   EKG showed sinus rhythm with no acute changes.   Results for orders placed or performed in visit on 11/28/15  Basic metabolic panel  Result Value Ref Range   Sodium 133 (L) 135 - 146 mmol/L   Potassium 4.4 3.5 - 5.3 mmol/L   Chloride 98 98 - 110 mmol/L   CO2 26 20 - 31 mmol/L   Glucose, Bld 319 (H) 65 - 99 mg/dL   BUN 12 7 - 25 mg/dL   Creat 1.61 0.96 - 0.45 mg/dL   Calcium 9.3 8.6 - 40.9 mg/dL  POCT CBC  Result Value Ref Range   WBC 10.4 (A) 4.6 - 10.2 K/uL   Lymph, poc 3.4 0.6 - 3.4   POC LYMPH PERCENT 32.4 10 - 50 %L   MID (cbc) 0.5 0 - 0.9   POC MID % 5.0 0 - 12 %M   POC Granulocyte 6.5 2 - 6.9   Granulocyte percent 62.6 37 - 80 %G   RBC 5.35 4.04 - 5.48 M/uL   Hemoglobin 12.9 12.2 - 16.2 g/dL   HCT, POC 81.1 91.4 - 47.9 %   MCV 72.9 (A) 80 - 97 fL   MCH, POC 24.2 (A) 27 - 31.2 pg   MCHC 33.2 31.8 - 35.4 g/dL   RDW, POC 78.2 %   Platelet Count, POC 263 142 - 424 K/uL   MPV 7.9 0 - 99.8 fL  POCT glucose (manual entry)  Result Value Ref Range   POC Glucose 319 (A) 70 - 99 mg/dl   No data found.       Assessment & Plan:   Cathy Robbins is a 60 y.o. female No diagnosis found.  Uncontrolled diabetes. Continued improving with lessening dizziness, but still uncontrolled.   -Continue metformin (currently at  QD) with titration to  BID over the next week.  No side effects currently.   - continue amaryl, lisinopril, simvastatin same doses.  -repeat BMP.   - continue BS check daily. Call if consistently over 300.       -husband translating, reports understanding             - BS today 166  - f/u 2 weeks, sooner if any  issues.    Signed,  Guinevere Scarlet, MD

## 2015-12-18 ENCOUNTER — Ambulatory Visit (INDEPENDENT_AMBULATORY_CARE_PROVIDER_SITE_OTHER): Payer: BLUE CROSS/BLUE SHIELD | Admitting: Family Medicine

## 2015-12-18 VITALS — BP 110/68 | HR 68 | Temp 98.0°F | Resp 18 | Ht 61.5 in | Wt 109.6 lb

## 2015-12-18 DIAGNOSIS — E119 Type 2 diabetes mellitus without complications: Secondary | ICD-10-CM | POA: Diagnosis not present

## 2015-12-18 NOTE — Patient Instructions (Addendum)
Continue taking lisinopril in the morning, glimepiride 4 mg in the morning, and simvastatin after the evening meal.  Take the metformin 1000 mg (500 mg 2) in the morning and 1000 mg (500 mg 2) with the evening meal.  Get regular exercise  Monitor the blood sugars before breakfast and before the evening meal. If she gets episodes of feeling too weak, breaking out in a sweat, feeling like she might faint, or getting shaky please check her blood sugar again. We desired the sugars to be usually attained about 100- 140. If it is often running below 100 we may need to make a change in the medicine, and if it is running below 80 that is of more concern. If it is running too low and she is having symptoms from the low sugar she should eat something promptly or drink some juice with sugar in it. If this happens often you should discuss it with this office so we can adjust the medications again.  Plan to return in the middle of August for a recheck. Sooner if problems.    IF you received an x-ray today, you will receive an invoice from [facility]. Please contact [facility] at [phone] with questions or concerns regarding your invoice.   IF you received labwork today, you will receive an invoice from [facility]. Please contact [facility] at [phone] with questions or concerns regarding your invoice.   Our billing staff will not be able to assist you with questions regarding bills from these companies.  You will be contacted with the lab results as soon as they are available. The fastest way to get your results is to activate your My Chart account. Instructions are located on the last page of this paperwork. If you have not heard from us regarding the results in 2 weeks, please contact this office.

## 2015-12-18 NOTE — Progress Notes (Signed)
Patient ID: Cathy Robbins, female    DOB: [birth year]  Age: 60 y.o. MRN: [id number]  Chief Complaint  Patient presents with  . Blood Sugar Problem    elevated blood sugar x3 weeks    Subjective:   With her husband interpreting. She has been taking her diabetic medicines faithfully. No major problems reported. She had 1 sugar that was a little on the low side but asymptomatic. They brought in the list of sugars. She is fairly consistently running below 200. One low sugar was down to 87. She has been taking the metformin 500 mg 1 in the morning 1 in the evening, glimepiride 4 mg 1 in the morning, simvastatin 10 mg in the evening and lisinopril 10 mg in the morning. No other new problems.  Current allergies, medications, problem list, past/family and social histories reviewed.  Objective:  BP 110/68 mmHg  Pulse 68  Temp(Src) 98 F (36.7 C) (Oral)  Resp 18  Ht 5' 1.5" (1.562 m)  Wt 109 lb 9.6 oz (49.714 kg)  BMI 20.38 kg/m2  SpO2 99%  Sugar list was reviewed. Vital signs are stable. Chest clear. Heart regular.  Assessment & Plan:   Assessment: No diagnosis found.    Plan: Type 2 diabetes mellitus in satisfactory control. Continue current medications, except she is to take the metformin 1000 mg twice daily. She is to return in about 2 months unless problems occur before then. Structures.   Patient Instructions   Continue taking lisinopril in the morning, glimepiride 4 mg in the morning, and simvastatin after the evening meal.  Take the metformin 1000 mg (500 mg 2) in the morning and 1000 mg (500 mg 2) with the evening meal.  Get regular exercise  Monitor the blood sugars before breakfast and before the evening meal. If she gets episodes of feeling too weak, breaking out in a sweat, feeling like she might faint, or getting shaky please check her blood sugar again. We desired the sugars to be usually attained about 100- 140. If it is often running below 100 we may need to make a change  in the medicine, and if it is running below 80 that is of more concern. If it is running too low and she is having symptoms from the low sugar she should eat something promptly or drink some juice with sugar in it. If this happens often you should discuss it with this office so we can adjust the medications again.  Plan to return in the middle of August for a recheck. Sooner if problems.    IF you received an x-ray today, you will receive an invoice from [facility]. Please contact [facility] at [phone] with questions or concerns regarding your invoice.   IF you received labwork today, you will receive an invoice from [facility]. Please contact [facility] at [phone] with questions or concerns regarding your invoice.   Our billing staff will not be able to assist you with questions regarding bills from these companies.  You will be contacted with the lab results as soon as they are available. The fastest way to get your results is to activate your My Chart account. Instructions are located on the last page of this paperwork. If you have not heard from us regarding the results in 2 weeks, please contact this office.          Return in about 2 months (around 02/17/2016).   HOPPER,DAVID, MD 12/18/2015

## 2016-01-12 ENCOUNTER — Ambulatory Visit (INDEPENDENT_AMBULATORY_CARE_PROVIDER_SITE_OTHER): Payer: BLUE CROSS/BLUE SHIELD | Admitting: Emergency Medicine

## 2016-01-12 VITALS — BP 116/74 | HR 72 | Temp 98.0°F | Resp 18 | Ht 61.5 in | Wt 110.0 lb

## 2016-01-12 DIAGNOSIS — Z23 Encounter for immunization: Secondary | ICD-10-CM | POA: Diagnosis not present

## 2016-01-12 DIAGNOSIS — E1165 Type 2 diabetes mellitus with hyperglycemia: Secondary | ICD-10-CM | POA: Diagnosis not present

## 2016-01-12 DIAGNOSIS — E119 Type 2 diabetes mellitus without complications: Secondary | ICD-10-CM

## 2016-01-12 DIAGNOSIS — IMO0001 Reserved for inherently not codable concepts without codable children: Secondary | ICD-10-CM

## 2016-01-12 DIAGNOSIS — Z1239 Encounter for other screening for malignant neoplasm of breast: Secondary | ICD-10-CM

## 2016-01-12 DIAGNOSIS — E785 Hyperlipidemia, unspecified: Secondary | ICD-10-CM

## 2016-01-12 LAB — MICROALBUMIN, URINE: Microalb, Ur: 0.8 mg/dL

## 2016-01-12 LAB — GLUCOSE, POCT (MANUAL RESULT ENTRY): POC GLUCOSE: 98 mg/dL (ref 70–99)

## 2016-01-12 MED ORDER — METFORMIN HCL 500 MG PO TABS: ORAL_TABLET | ORAL | Status: DC

## 2016-01-12 MED ORDER — LISINOPRIL 10 MG PO TABS: 10.0000 mg | ORAL_TABLET | Freq: Every day | ORAL | Status: DC

## 2016-01-12 MED ORDER — SIMVASTATIN 10 MG PO TABS: 10.0000 mg | ORAL_TABLET | Freq: Every day | ORAL | Status: DC

## 2016-01-12 MED ORDER — GLIMEPIRIDE 4 MG PO TABS: ORAL_TABLET | ORAL | Status: DC

## 2016-01-12 NOTE — Patient Instructions (Addendum)
Recheck 3 months. Decrease Amaryl to one half tablet a day. Other medications will be the same.    IF you received an x-ray today, you will receive an invoice from [facility]. Please contact [facility] at [phone] with questions or concerns regarding your invoice.   IF you received labwork today, you will receive an invoice from [facility]. Please contact [facility] at [phone] with questions or concerns regarding your invoice.   Our billing staff will not be able to assist you with questions regarding bills from these companies.  You will be contacted with the lab results as soon as they are available. The fastest way to get your results is to activate your My Chart account. Instructions are located on the last page of this paperwork. If you have not heard from [place] regarding the results in 2 weeks, please contact this office.    B?nh ti?u ???ng tp 2, Ng??i l?n (Type 2 Diabetes Mellitus, Adult) B?nh ti?u ???ng tp 2, th??ng g?i ??n gi?n l ti?u ???ng tp 2, l m?t b?nh ko di (m?n tnh). Trong ti?u ???ng tp 2, tuy?n t?y khng s?n xu?t ?? insulin (hocmon), cc t? bo t ?p ?ng v?i insulin lm cho ( khng insulin), ho?c c? hai. Thng th??ng, insulin v?n chuy?n ???ng t? th?c ?n vo cc t? bo ? m. Cc t? bo ? m s? d?ng ???ng ?? s?n sinh ra n?ng l??ng. Thi?u h?t insulin ho?c khng ?p ?ng bnh th??ng v?i insulin gy ra l??ng ???ng d? th?a tch t? trong mu thay v ?i vo cc t? bo ? m. K?t qu? l, lm cho l??ng ???ng trong mu cao (t?ng ???ng huy?t). ?nh h??ng c?a hm l??ng ???ng (glucose) cao c th? gy ra nhi?u bi?n ch?ng.  B?nh ti?u ???ng tp 2 tr??c ?y cn ???c g?i l b?nh ti?u ???ng kh?i pht ? ng??i l?n, nh?ng n c th? x?y ra ? b?t c? l?a tu?i no.  CC Y?U T? NGUY C?  M?t ng??i d? b? b?nh ti?u ???ng tp 2 n?u c ai ? trong gia ?nh b? b?nh ny, ??ng th?i c m?t ho?c nhi?u y?u t? nguy c? chnh sau ?y:  T?ng cn ho?c th?a cn  ho?c bo ph.  L?i s?ng t ho?t ??ng.  Ti?n s? lin t?c ?n th?c ?n nhi?u n?ng l??ng. Duy tr cn n?ng bnh th??ng v ho?t ??ng thn th? th??ng xuyn c th? lm gi?m nguy c? pht tri?n b?nh ti?u ???ng tp 2. TRI?U CH?NG  Ban ??u, m?t ng??i b? b?nh ti?u ???ng tp 2 c th? khng c cc tri?u ch?ng. Cc tri?u ch?ng c?a b?nh ti?u ???ng tp 2 xu?t hi?n t? t?. Cc tri?u ch?ng bao g?m:  Kht n??c nhi?u (ch?ng kht nhi?u).  Ti?u ti?n nhi?u (?a ni?u).  ?i ti?u nhi?u vo ban ?m (ti?u ?m).  Thay ??i cn n?ng m?t cch ??t ng?t ho?c khng r nguyn nhn.  Th??ng xuyn b? nhi?m trng ti pht.  M?t m?i (m?t)  Y?u.  Thay ??i th? l?c, ch?ng h?n nh? nhn m?.  Mi tri cy trong h?i th? c?a qu v?.  ?au b?ng.  Bu?n nn ho?c nn m?a.  V?t c?t ho?c v?t b?m tm lu lnh.  ?au bu?t ho?c t ? bn tay ho?c bn chn.  V?t th??ng h? trn da (lot). CH?N ?ON B?nh ti?u ???ng tp 2 th??ng khng ???c ch?n ?on cho ??n khi xu?t hi?n cc bi?n ch?ng c?a b?nh ti?u ???ng. B?nh ti?u ???ng tp 2 ???  c ch?n ?on khi xu?t hi?n cc tri?u ch?ng c?ng nh? bi?n ch?ng v khi l??ng ???ng huy?t t?ng. L??ng ???ng huy?t c th? ???c ki?m tra b?ng m?t ho?c nhi?u xt nghi?m mu sau ?y:  Xt nghi?m ???ng huy?t lc ?i. Qu v? s? khng ???c php ?n trong t nh?t l 8 ti?ng tr??c khi l?y m?u mu.  Xt nghi?m ???ng huy?t ng?u nhin. ???ng huy?t ???c xt nghi?m b?t k? lc no trong ngy, b?t k? qu v? ?n lc no.  Xt nghi?m ???ng huy?t A1c hemoglobin. Xt nghi?m A1c hemoglobin cung c?p thng tin v? vi?c ki?m sot ???ng huy?t trong 3 thng tr??c ?.  Xt nghi?m dung n?p glucose theo ???ng u?ng (OGTT). ???ng huy?t c?a qu v? ???c ?o sau khi qu v? ch?a ?n (nh?n ?n) trong 2 gi? v sau ? l sau khi qu v? u?ng ?? u?ng c ch?a glucose. ?I?U TR?   Qu v? c th? c?n dng insulin ho?c thu?c tr? ti?u ???ng hng ngy ?? gi? cho l??ng ???ng huy?t trong ph?m vi mong mu?n.  N?u qu v? dng insulin, qu v? c th? c?n ?i?u ch?nh li?u  thu?c ty thu?c vo l??ng carbohydrate m qu v? ?n trong m?i b?a ?n chnh ho?c b?a ?n nh?.  Thay ??i l?i s?ng ???c khuy?n ngh? l m?t ph?n trong bi?n php ?i?u tr? c?a qu v?. Nh?ng thay ??i ny c th? bao g?m:  Tun theo m?t ch? ?? ?n c nhn ha do m?t chuyn gia dinh d??ng ??a ra.  T?p th? d?c hng ngy. Chuyn gia ch?m [place] s?c kh?e s? ??t cc m?c tiu ?i?u tr? c nhn ha cho qu v? d?a theo tu?i, cc lo?i thu?c c?a qu v?, th?i gian qu v? ? b? ti?u t??ng, v b?t k? tnh tr?ng b?nh l no khc m qu v? c. Thng th??ng, m?c tiu ?i?u tr? l duy tr n?ng ?? glucose trong mu:  Tr??c khi ?n (tr??c b?a ?n): 80-130 mg/dL.  Sau khi ?n (sau b?a ?n): d??i 180 mg/dL.  A1c: th?p h?n 6,5-7%. H??NG D?N CH?M [place] T?I NH   L??ng A1c hemoglobin c?a qu v? ???c ki?m tra hai l?n m?i n?m.  Th?c hi?n vi?c theo di ???ng huy?t hng ngy theo ch? d?n c?a chuyn gia ch?m [place] s?c kh?e.  Theo di [place] trong n??c ti?u khi qu v? b? b?nh v theo ch? d?n c?a chuyn gia ch?m [place] s?c kh?e.  S? d?ng thu?c tr? ti?u ???ng ho?c insulin theo ch? d?n c?a chuyn gia ch?m [place] s?c kh?e ?? duy tr l??ng ???ng huy?t trong ph?m vi mong mu?n.  Khng bao gi? ?? h?t thu?c tr? ti?u ???ng ho?c insulin. Thu?c c?n ph?i dng hng ngy.  N?u qu v? ?ang dng insulin, qu v? c th? ?i?u ch?nh l??ng insulin d?a vo l??ng carbohydrates qu v? ?n. Carbohydrate c th? lm t?ng l??ng ???ng huy?t nh?ng c?n ph?i bao g?m trong ch? ?? ?n u?ng c?a qu v?. Carbohydrate cung c?p vitamin, khong ch?t v ch?t x?, l m?t ph?n thi?t y?u c?a ch? ?? ?n u?ng c l?i cho s?c kh?e. Carbohydrate ???c tm th?y trong tri cy, rau, ng? c?c, cc s?n ph?m t? s?a, cc lo?i ??u v cc lo?i th?c ph?m c b? sung thm ???ng.  ?n th?c ?n c l?i cho s?c kh?e. Qu v? c?n h?n g?p m?t chuyn gia dinh d??ng c ??ng k hnh ngh? ?? gip qu v? ??a ra m?t k? ho?ch ?n u?ng ph h?p.  Gi?m cn n?u qu v? th?a cn.  Mang theo th? c?nh bo y t? ho?c ?eo ?? trang s?c c  c?nh bo y t?.  Mang theo ?? ?n nh? ch?a 15 gam carbohydrate m?i lc ?? ?i?u tr? h? ???ng huy?t (h? ???ng huy?t). M?t s? v d? v? ?? ?n nh? ch?a 15 gam carbohydrate bao g?m:  Vin glucose, 3 ho?c 4.  Gel glucose, ?ng 15 gam.  Nho kh, 2 mu?ng (24 gam).  Th?ch hnh h?t ??u, 6.  Bnh quy hnh con gi?ng, 8.  N??c u?ng c ga thng th??ng, 4 aox? (120 ml)  K?o chp chp, 9.  Nh?n bi?t h? ???ng huy?t. H? ???ng huy?t x?y ra khi l??ng ???ng huy?t t? 70 mg/dL tr? xu?ng. Nguy c? h? ???ng huy?t gia t?ng khi nh?n ?n ho?c b? b?a, trong v sau khi t?p th? d?c c??ng ?? cao v trong khi ng?. Cc tri?u ch?ng h? ???ng huy?t c th? bao g?m:  Run ho?c l?c.  Gi?m kh? n?ng t?p trung.  ?? m? hi.  Nh?p tim t?ng.  ?au ??u.  Kh mi?ng.  ?i.  D? b? kch thch.  Lo u.  Ng? khng yn.  Thay ??i l?i ni ho?c s? ph?i h?p.  B? l l?n.  ?i?u tr? h? ???ng huy?t k?p th?i. N?u qu v? t?nh to v c th? nu?t m?t cch an ton, hy theo quy t?c 15:15:  Dng 15-20 gam glucose ho?c carbohydrate c tc d?ng nhanh. L?a ch?n tc ??ng nhanh bao g?m gel glucose, vin glucose ho?c 4 aox? (120 ml) n??c p tri cy, soda bnh th??ng ho?c s?a t bo.  Ki?m tra l??ng ???ng huy?t c?a qu v? 15 pht sau khi u?ng glucose.  Dng t? 15-20 gam glucose tr? ln n?u l??ng ???ng huy?t ???c ?o l?i v?n ? m?c 70 mg/dL tr? xu?ng.  ?n theo b?a ?n bnh th??ng ho?c ?? ?n nh? trong vng 1 ti?ng sau khi l??ng ???ng huy?t tr? l?i bnh th??ng.  Hy c?nh gic v?i c?m gic r?t kht v ?i ti?u ti?n nhi?u l?n h?n bnh th??ng v ?y l nh?ng d?u hi?u s?m c?a t?ng ???ng huy?t. Vi?c pht hi?n t?ng ???ng huy?t s?m cho php ?i?u tr? k?p th?i. ?i?u tr? t?ng ???ng huy?t theo ch? d?n c?a chuyn gia ch?m [place] s?c kh?e.  M?i tu?n tham gia vo t nh?t 150 pht ho?t ??ng thn th? v?i c??ng ?? trung bnh, phn b? trong t nh?t 3 ngy trong tu?n ho?c theo ch? d?n c?a chuyn gia ch?m [place] s?c kh?e. Ngoi ra, qu v? nn tham gia vo bi t?p c s?c  c?n t nh?t 2 l?n m?t tu?n ho?c theo ch? d?n c?a chuyn gia ch?m [place] s?c kh?e. C? g?ng dnh khng qu 90 pht m?i l?n khng ho?t ??ng.  ?i?u ch?nh thu?c v l??ng th?c ?n khi c?n n?u qu v? b?t ??u m?t bi t?p ho?c m?t mn th? thao m?i.  Lm theo k? ho?ch trong ngy b? b?nh c?a qu v? b?t c? lc no m qu v? khng th? ?n ho?c u?ng nh? bnh th??ng.  Khng s? d?ng cc s?n ph?m thu?c l bao g?m thu?c l ht, thu?c l d?ng nhai ho?c thu?c l ?i?n t?. N?u qu v? c?n gip ?? ?? cai thu?c, hy h?i chuyn gia ch?m [place] s?c kh?e.  Gi?i h?n l??ng r??u qu v? u?ng khng qu 1 ly m?i ngy v?i ph? n? khng mang thai v 2 ly m?i ngy v?i nam gi?i.  Qu v? ch? nn u?ng r??u khi ?n. Ni chuy?n v?i chuyn gia ch?m [place] s?c kh?e xem u?ng r??u c an ton cho qu v? hay khng. Cho chuyn gia ch?m [place] s?c kh?e bi?t n?u qu v? u?ng r??u vi l?n m?i tu?n.  Tun th? m?i cu?c h?n khm l?i theo ch? d?n c?a chuyn gia ch?m [place] s?c kh?e. ?i?u ny l quan tr?ng.  S?p x?p bu?i khm m?t ngay sau khi ch?n ?on b?nh ti?u ???ng tp 2 v sau ? l hng n?m.  Th?c hi?n ch?m [place] da v bn chn hng ngy. Ki?m tra da v bn chn hng ngy xem c v?t c?t, v?t b?m tm, t?y ??, v?n ?? v? mng, ch?y mu, m?n n??c hay l? lot khng. Bn chn c?n ???c chuyn gia ch?m [place] s?c kh?e khm hng n?m.  ?nh r?ng v l?i t nh?t hai l?n m?i ngy v dng ch? nha khoa t nh?t m?t l?n m?i ngy. G?p nha s? ?? khm l?i th??ng xuyn.  Chia s? k? ho?ch qu?n l b?nh ti?u ???ng c?a qu v? ? n?i lm vi?c ho?c tr??ng h?c c?a qu v?.  C?p nh?t l?ch tim ch?ng c?a qu v?. Qu v? nn tim v?c xin cm (b?nh cm) hng n?m. Qu v? c?ng nn tim v?c xin vim ph?i (ph? c?u khu?n). N?u qu v? 65 tu?i tr? ln v ch?a ???c tim v?c xin vim ph?i, v?c xin ny c th? ???c tim m?t lo?t hai m?i ring. Hy h?i chuyn gia ch?m [place] s?c kh?e xem nn tim thm lo?i v?c xin no.  H?c cch qu?n l c?ng th?ng.  Xin ???c gio d?c v h? tr? v? b?nh ti?u ???ng th??ng xuyn khi  c?n.  Tham gia ho?c tm cch ph?c h?i ch?c n?ng khi c?n thi?t ?? duy tr ho?c c?i thi?n kh? n?ng ??c l?p v ch?t l??ng cu?c s?ng. Yu c?u chuy?n sang v?t l tr? li?u ho?c li?u php ngh? nghi?p n?u qu v? b? t bn chn ho?c t tay, ho?c kh ch?i ??u, kh m?c qu?n o, ?n u?ng ho?c ho?t ??ng th? ch?t. ?I KHM N?U:   Qu v? khng th? ?n ho?c u?ng trong h?n 6 ti?ng.  Qu v? b? bu?n nn v nn m?a trong h?n 6 ti?ng.  L??ng ???ng huy?t c?a qu v? cao trn 240 mg/dL.  C thay ??i tr?ng thi tinh th?n.  Qu v? b? thm m?t c?n b?nh nghim tr?ng.  Qu v? b? tiu ch?y trong h?n 6 ti?ng.  Qu v? ? b? ?m ho?c b? s?t trong m?t vi ngy v khng ?? h?n.  Qu v? b? ?au trong khi tham gia b?t k? ho?t ??ng thn th? no. NGAY L?P T?C ?I KHM N?U:  Qu v? b? kh th?.  Qu v? c l??ng ketone ? m?c trung bnh ??n cao.   Thng tin ny khng nh?m m?c ?ch thay th? cho l?i khuyn m chuyn gia ch?m [place] s?c kh?e ni v?i qu v?. Hy b?o ??m qu v? ph?i th?o lu?n b?t k? v?n ?? g m qu v? c v?i chuyn gia ch?m [place] s?c kh?e c?a qu v?.   Document Released: 06/17/2005 Document Revised: 03/08/2015 Elsevier Interactive Patient Education [employer]2016 Elsevier Inc.

## 2016-01-12 NOTE — Progress Notes (Addendum)
By signing my name below, I, Mesha Guinyard, attest that this documentation has been prepared under the direction and in the presence of Lesle ChrisSteven Keyden Pavlov, MD.  Electronically Signed: Arvilla MarketMesha Guinyard, Medical Scribe. 01/12/2016. 8:27 AM.  Chief Complaint:  Chief Complaint  Patient presents with  . Hyperglycemia    HPI: Cathy Robbins is a 60 y.o. female with a PMHx of DM, HLD who [facility] complaining of hyperglycemia. Pt doesn't speak English, so she brought someone to translate for her. Pt's blood sugars have been uncontrolled, ranging from 90 - 333. Pt takes 4 Metformin a day; 2 in the morning, and 2 in the evening. Pt takes her medication every day. Pt's A1c 11/20/15 was 9.6 Pt eats a lot of white grains- rice, and bread. Pt would like to get a medication refill for Lisinopril. Pt denies taking ASA daily. Pt denies high candy consumption. Pt denies visiting the ophthalmologist. Pt denies getting a mammogram. Pt denies getting a colonoscopy.  Past Medical History  Diagnosis Date  . Diabetes mellitus without complication (HCC)    History reviewed. No pertinent past surgical history. Social History   Social History  . Marital Status: Married    Spouse Name: N/A  . Number of Children: N/A  . Years of Education: N/A   Social History Main Topics  . Smoking status: Never Smoker   . Smokeless tobacco: None  . Alcohol Use: No  . Drug Use: No  . Sexual Activity: Not Asked   Other Topics Concern  . None   Social History Narrative   History reviewed. No pertinent family history. No Known Allergies Prior to Admission medications   Medication Sig Start Date End Date Taking? Authorizing Provider  glimepiride (AMARYL) 4 MG tablet Take 1 tablet (4 mg total) by mouth daily with breakfast. 11/20/15  Yes Elvina SidleKurt Lauenstein, MD  lisinopril (PRINIVIL,ZESTRIL) 10 MG tablet Take 10 mg by mouth daily.   Yes Historical Provider, MD  metFORMIN (GLUCOPHAGE) 500 MG tablet Take 1 tablet (500 mg total)  by mouth 2 (two) times daily with a meal. Start once per day, then increase every  3 days then increase to 2 tablets twice per day 12/04/15  Yes Guinevere ScarletBlake Williams, MD  Omega-3 Fatty Acids (FISH OIL) 1000 MG CAPS Take by mouth.   Yes Historical Provider, MD  simvastatin (ZOCOR) 10 MG tablet Take 1 tablet (10 mg total) by mouth daily. 11/20/15  Yes Elvina SidleKurt Lauenstein, MD     ROS: The patient denies fevers, chills, night sweats, unintentional weight loss, chest pain, palpitations, wheezing, dyspnea on exertion, nausea, vomiting, abdominal pain, dysuria, hematuria, melena, numbness, weakness, or tingling.  All other systems have been reviewed and were otherwise negative with the exception of those mentioned in the HPI and as above.    PHYSICAL EXAM: Filed Vitals:   01/12/16 0815  BP: 116/74  Pulse: 72  Temp: 98 F (36.7 C)  Resp: 18   Body mass index is 20.45 kg/(m^2).   General: Alert, no acute distress HEENT:  Normocephalic, atraumatic, oropharynx patent. Eye: Nonie HoyerOMI, [facility] Cardiovascular:  Regular rate and rhythm, no rubs murmurs or gallops.  No Carotid bruits, radial pulse intact. No pedal edema.  Respiratory: Clear to auscultation bilaterally.  No wheezes, rales, or rhonchi.  No cyanosis, no use of accessory musculature Abdominal: No organomegaly, abdomen is soft and non-tender, positive bowel sounds.  No masses. Musculoskeletal: Gait intact. No edema, tenderness Skin: No rashes. Neurologic: Facial musculature symmetric. Psychiatric: Patient acts appropriately throughout  our interaction. Lymphatic: No cervical or submandibular lymphadenopathy  Wt Readings from Last 3 Encounters:  01/12/16 110 lb (49.896 kg)  12/18/15 109 lb 9.6 oz (49.714 kg)  12/04/15 109 lb (49.442 kg)    LABS: Results for orders placed or performed in visit on 01/12/16  POCT glucose (manual entry)  Result Value Ref Range   POC Glucose 98 70 - 99 mg/dl   EKG/XRAY:   Primary read interpreted by Dr. Cleta Alberts at  [facility]   ASSESSMENT/PLAN: Lisinopril was refilled. She did have some low spells on her sugars. I decreased her Amaryl to 2 mg a day. She will continue on metformin 500 mg she takes 2 twice a day. Simvastatin dose the same and lisinopril dose the same recheck 3 months.I personally performed the services described in this documentation, which was scribed in my presence. The recorded information has been reviewed and is accurate. Weight is stable. Pneumonia  23 vaccine given today.I personally performed the services described in this documentation, which was scribed in my presence. The recorded information has been reviewed and is accurate.   Gross sideeffects, risk and benefits, and alternatives of medications d/w patient. Patient is aware that all medications have potential sideeffects and we are unable to predict every sideeffect or drug-drug interaction that may occur.  Lesle Chris MD 01/12/2016 8:27 AM

## 2016-05-17 ENCOUNTER — Ambulatory Visit (INDEPENDENT_AMBULATORY_CARE_PROVIDER_SITE_OTHER): Payer: BLUE CROSS/BLUE SHIELD | Admitting: Physician Assistant

## 2016-05-17 VITALS — BP 118/68 | HR 70 | Temp 98.0°F | Resp 16 | Ht 61.0 in | Wt 108.0 lb

## 2016-05-17 DIAGNOSIS — R42 Dizziness and giddiness: Secondary | ICD-10-CM

## 2016-05-17 DIAGNOSIS — B349 Viral infection, unspecified: Secondary | ICD-10-CM

## 2016-05-17 LAB — POCT CBC
Granulocyte percent: 62 %G (ref 37–80)
HCT, POC: 35.3 % — AB (ref 37.7–47.9)
Hemoglobin: 11.9 g/dL — AB (ref 12.2–16.2)
Lymph, poc: 3.7 — AB (ref 0.6–3.4)
MCH, POC: 24.6 pg — AB (ref 27–31.2)
MCHC: 33.6 g/dL (ref 31.8–35.4)
MCV: 73.2 fL — AB (ref 80–97)
MID (CBC): 0.8 (ref 0–0.9)
MPV: 7.3 fL (ref 0–99.8)
PLATELET COUNT, POC: 268 10*3/uL (ref 142–424)
POC Granulocyte: 7.3 — AB (ref 2–6.9)
POC LYMPH %: 11.5 % (ref 10–50)
POC MID %: 6.5 % (ref 0–12)
RBC: 4.82 M/uL (ref 4.04–5.48)
RDW, POC: 12.9 %
WBC: 11.7 10*3/uL — AB (ref 4.6–10.2)

## 2016-05-17 LAB — COMPLETE METABOLIC PANEL WITH GFR
ALT: 13 U/L (ref 6–29)
AST: 16 U/L (ref 10–35)
Albumin: 4.2 g/dL (ref 3.6–5.1)
Alkaline Phosphatase: 54 U/L (ref 33–130)
BUN: 10 mg/dL (ref 7–25)
CHLORIDE: 103 mmol/L (ref 98–110)
CO2: 25 mmol/L (ref 20–31)
CREATININE: 0.71 mg/dL (ref 0.50–0.99)
Calcium: 9.6 mg/dL (ref 8.6–10.4)
GFR, Est African American: >89 mL/min (ref >=60)
GFR, Est Non African American: >89 mL/min (ref >=60)
GLUCOSE: 115 mg/dL — AB (ref 65–99)
POTASSIUM: 4.3 mmol/L (ref 3.5–5.3)
SODIUM: 138 mmol/L (ref 135–146)
Total Bilirubin: 1 mg/dL (ref 0.2–1.2)
Total Protein: 7.4 g/dL (ref 6.1–8.1)

## 2016-05-17 LAB — POCT URINALYSIS DIP (MANUAL ENTRY)
BILIRUBIN UA: NEGATIVE
GLUCOSE UA: NEGATIVE
Ketones, POC UA: NEGATIVE
Leukocytes, UA: NEGATIVE
Nitrite, UA: NEGATIVE
Protein Ur, POC: NEGATIVE
RBC UA: NEGATIVE
SPEC GRAV UA: 1.02
UROBILINOGEN UA: 0.2
pH, UA: 5.5

## 2016-05-17 LAB — POC MICROSCOPIC URINALYSIS (UMFC): MUCUS RE: ABSENT

## 2016-05-17 LAB — POCT INFLUENZA A/B
INFLUENZA A, POC: NEGATIVE
INFLUENZA B, POC: NEGATIVE

## 2016-05-17 LAB — GLUCOSE, POCT (MANUAL RESULT ENTRY): POC Glucose: 112 mg/dl — AB (ref 70–99)

## 2016-05-17 LAB — TSH: TSH: 0.99 m[IU]/L

## 2016-05-17 MED ORDER — OSELTAMIVIR PHOSPHATE 75 MG PO CAPS: 75.0000 mg | ORAL_CAPSULE | Freq: Two times a day (BID) | ORAL | 0 refills | Status: DC

## 2016-05-17 MED ORDER — PSEUDOEPHEDRINE-ACETAMINOPHEN 30-325 MG PO TABS: 1.0000 | ORAL_TABLET | Freq: Four times a day (QID) | ORAL | 0 refills | Status: AC

## 2016-05-17 NOTE — Patient Instructions (Addendum)
-  Take sudaphed as prescribed. -If no improvement by tomorrow, you can pick up tamiflu to take - I recommend you rest, drink plenty of fluids, eat light meals including soups.  - Please come back in 2 days for follow up   IF you received an x-ray today, you will receive an invoice from [facility]. Please contact [facility] at [phone] with questions or concerns regarding your invoice.   IF you received labwork today, you will receive an invoice from [facility]. Please contact [facility] at [phone] with questions or concerns regarding your invoice.   Our billing staff will not be able to assist you with questions regarding bills from these companies.  You will be contacted with the lab results as soon as they are available. The fastest way to get your results is to activate your My Chart account. Instructions are located on the last page of this paperwork. If you have not heard from us regarding the results in 2 weeks, please contact this office.

## 2016-05-17 NOTE — Progress Notes (Signed)
Lucky CowboySia Schubach  MRN: [id number] DOB: 05/08/1956  Subjective:  Cathy Robbins is a 60 y.o. female seen in office today for a chief complaint of dizziness x 1 day. Had associated subjective fever, headache, body aches, and chills. Notes that she was fine prior to yesterday and then this hit her out of no where. She works at [employer] in [employer] and is always around sick people. She did not have flu shot this year. Denies seasonal allergies or asthma. She does not smoke. She has only drank half a glass of water the past few days.   Review of Systems  Constitutional: Positive for fatigue and fever. Negative for appetite change, chills and diaphoresis.  HENT: Positive for tinnitus (in left ear). Negative for congestion, ear pain, hearing loss and sinus pain.   Respiratory: Negative for cough and shortness of breath.   Cardiovascular: Negative for chest pain and palpitations.  Gastrointestinal: Negative for abdominal pain, nausea and vomiting.  Genitourinary: Positive for frequency. Negative for dysuria.       Positive for malodorous urine.   Neurological: Positive for headaches ( yesterday, resovled today). Negative for speech difficulty, weakness and numbness.    Patient Active Problem List   Diagnosis Date Noted  . Type 2 diabetes mellitus without complication, without long-term current use of insulin (HCC) 11/20/2015  . Hyperlipidemia 11/20/2015    Current Outpatient Prescriptions on File Prior to Visit  Medication Sig Dispense Refill  . glimepiride (AMARYL) 4 MG tablet Take one half tablet a day 30 tablet 11  . lisinopril (PRINIVIL,ZESTRIL) 10 MG tablet Take 1 tablet (10 mg total) by mouth daily. 30 tablet 11  . metFORMIN (GLUCOPHAGE) 500 MG tablet Take 2 tablets twice a day 120 tablet 11  . Omega-3 Fatty Acids (FISH OIL) 1000 MG CAPS Take by mouth.    . simvastatin (ZOCOR) 10 MG tablet Take 1 tablet (10 mg total) by mouth daily. 90 tablet 3   No current facility-administered medications on file prior  to visit.    No Known Allergies   Objective:  BP 118/68 (BP Location: Left Arm, Patient Position: Sitting, Cuff Size: Normal)   Pulse 70   Temp 98 F (36.7 C) (Oral)   Resp 16   Ht 5\' 1"  (1.549 m)   Wt 108 lb (49 kg)   SpO2 99%   BMI 20.41 kg/m   Physical Exam  Constitutional: She is oriented to person, place, and time. She appears dehydrated. She has a sickly appearance ( with pallor).  HENT:  Head: Normocephalic and atraumatic.  Right Ear: External ear and ear canal normal. A middle ear effusion is present.  Left Ear: External ear and ear canal normal. A middle ear effusion is present.  Nose: Mucosal edema and rhinorrhea present. Right sinus exhibits no maxillary sinus tenderness and no frontal sinus tenderness. Left sinus exhibits no maxillary sinus tenderness and no frontal sinus tenderness.  Mouth/Throat: Uvula is midline. Mucous membranes are dry. Posterior oropharyngeal erythema present.  Eyes: Conjunctivae are normal. Pupils are equal, round, and reactive to light.  Neck: Normal range of motion.  Cardiovascular: Normal rate, regular rhythm and normal heart sounds.   Pulmonary/Chest: Effort normal and breath sounds normal.  Abdominal: Soft. Normal appearance and bowel sounds are normal. There is no tenderness.  Neurological: She is alert and oriented to person, place, and time. She has normal strength and intact cranial nerves. She displays no atrophy and normal speech. She has a normal Straight Leg Raise Test, a  normal Cerebellar Exam, a normal Finger-Nose-Finger Test, a normal Heel to [employer] Test and a normal Romberg Test. Gait normal.  Negative Dix Hallpike   Skin: Skin is warm and dry.  Psychiatric: Affect normal.  Vitals reviewed.   Results for orders placed or performed in visit on 05/17/16 (from the past 24 hour(s))  POCT CBC     Status: Abnormal   Collection Time: 05/17/16  9:20 AM  Result Value Ref Range   WBC 11.7 (A) 4.6 - 10.2 K/uL   Lymph, poc 3.7 (A) 0.6 -  3.4   POC LYMPH PERCENT 11.5 10 - 50 %L   MID (cbc) 0.8 0 - 0.9   POC MID % 6.5 0 - 12 %M   POC Granulocyte 7.3 (A) 2 - 6.9   Granulocyte percent 62.0 37 - 80 %G   RBC 4.82 4.04 - 5.48 M/uL   Hemoglobin 11.9 (A) 12.2 - 16.2 g/dL   HCT, POC 40.935.3 (A) 81.137.7 - 47.9 %   MCV 73.2 (A) 80 - 97 fL   MCH, POC 24.6 (A) 27 - 31.2 pg   MCHC 33.6 31.8 - 35.4 g/dL   RDW, POC 91.412.9 %   Platelet Count, POC 268 142 - 424 K/uL   MPV 7.3 0 - 99.8 fL  POCT glucose (manual entry)     Status: Abnormal   Collection Time: 05/17/16  9:22 AM  Result Value Ref Range   POC Glucose 112 (A) 70 - 99 mg/dl  POCT urinalysis dipstick     Status: None   Collection Time: 05/17/16  9:24 AM  Result Value Ref Range   Color, UA yellow yellow   Clarity, UA clear clear   Glucose, UA negative negative   Bilirubin, UA negative negative   Ketones, POC UA negative negative   Spec Grav, UA 1.020    Blood, UA negative negative   pH, UA 5.5    Protein Ur, POC negative negative   Urobilinogen, UA 0.2    Nitrite, UA Negative Negative   Leukocytes, UA Negative Negative  POCT Microscopic Urinalysis ([facility])     Status: None   Collection Time: 05/17/16  9:24 AM  Result Value Ref Range   WBC,UR,HPF,POC None None WBC/hpf   RBC,UR,HPF,POC None None RBC/hpf   Bacteria None None, Too numerous to count   Mucus Absent Absent   Epithelial Cells, UR Per Microscopy None None, Too numerous to count cells/hpf  POCT Influenza A/B     Status: None   Collection Time: 05/17/16  9:33 AM  Result Value Ref Range   Influenza A, POC Negative Negative   Influenza B, POC Negative Negative    Assessment and Plan :  1. Dizziness - POCT glucose (manual entry) - POCT urinalysis dipstick - POCT Microscopic Urinalysis ([facility]) - POCT CBC - TSH - POCT Influenza A/B - COMPLETE METABOLIC PANEL WITH GFR  2. Viral illness Due to the presentation of patient and her history of illness, will treat empirically for the flu despite negative flu in office  Pt instructed to follow up with me on Monday, 05/20/16 for reevaluation. If her symptoms worsen before then she was instructed to seek care immediately. Pt understands.  - Pseudoephedrine-APAP 30-325 MG TABS; Take 1 tablet by mouth every 6 (six) hours.  Dispense: 112 each; Refill: 0 - oseltamivir (TAMIFLU) 75 MG capsule; Take 1 capsule (75 mg total) by mouth 2 (two) times daily.  Dispense: 10 capsule; Refill: 0  Benjiman CoreBrittany Kaynan Klonowski PA-C  [facility] 05/17/2016 9:36 AM

## 2016-05-20 ENCOUNTER — Ambulatory Visit (INDEPENDENT_AMBULATORY_CARE_PROVIDER_SITE_OTHER): Payer: BLUE CROSS/BLUE SHIELD | Admitting: Physician Assistant

## 2016-05-20 VITALS — BP 142/74 | HR 64 | Temp 98.3°F | Resp 16 | Ht 61.0 in | Wt 106.2 lb

## 2016-05-20 DIAGNOSIS — Z862 Personal history of diseases of the blood and blood-forming organs and certain disorders involving the immune mechanism: Secondary | ICD-10-CM

## 2016-05-20 LAB — POCT CBC
GRANULOCYTE PERCENT: 54 % (ref 37–80)
HEMATOCRIT: 35.4 % — AB (ref 37.7–47.9)
Hemoglobin: 11.9 g/dL — AB (ref 12.2–16.2)
Lymph, poc: 3.8 — AB (ref 0.6–3.4)
MCH, POC: 24.2 pg — AB (ref 27–31.2)
MCHC: 33.5 g/dL (ref 31.8–35.4)
MCV: 72.2 fL — AB (ref 80–97)
MID (CBC): 0.6 (ref 0–0.9)
MPV: 7.1 fL (ref 0–99.8)
PLATELET COUNT, POC: 270 10*3/uL (ref 142–424)
POC GRANULOCYTE: 5.2 (ref 2–6.9)
POC LYMPH %: 39.6 % (ref 10–50)
POC MID %: 6.4 %M (ref 0–12)
RBC: 4.9 M/uL (ref 4.04–5.48)
RDW, POC: 13 %
WBC: 9.6 10*3/uL (ref 4.6–10.2)

## 2016-05-20 NOTE — Patient Instructions (Addendum)
Continue taking the tamiflu until it is completed. You can stop using the sudafed.  Start trying to drink more water. You need at least 64 oz a day!  Please come back if you get sick again.   Thank you for letting me participate in your health and well being.     IF you received an x-ray today, you will receive an invoice from [facility]. Please contact [facility] at [phone] with questions or concerns regarding your invoice.   IF you received labwork today, you will receive an invoice from [facility]. Please contact [facility] at [phone] with questions or concerns regarding your invoice.   Our billing staff will not be able to assist you with questions regarding bills from these companies.  You will be contacted with the lab results as soon as they are available. The fastest way to get your results is to activate your My Chart account. Instructions are located on the last page of this paperwork. If you have not heard from us regarding the results in 2 weeks, please contact this office.

## 2016-05-20 NOTE — Progress Notes (Signed)
[name]  MRN: [id number] DOB: 12-26-1955  Subjective:  Cathy Robbins is a 60 y.o. female seen in office today for a chief complaint of follow up on  diziziness and headahce. She was intiially seen on 05/17/16 and had WBC of 11.4 with elevated lypmhs. She was treated with tamiflu and instructed to follow up today. She states she is feeling much better and does not have any symptoms today. Has been taking medications as prescribed. Denies dizziness, fever, cough, and headache. She stil is not drinking a lot of water. Only has a few sips a day.    Review of Systems  Constitutional: Negative for chills and diaphoresis.  Respiratory: Negative for shortness of breath and wheezing.   Gastrointestinal: Negative for diarrhea, nausea and vomiting.  Neurological: Negative for weakness and light-headedness.    Patient Active Problem List   Diagnosis Date Noted  . Type 2 diabetes mellitus without complication, without long-term current use of insulin (HCC) 11/20/2015  . Hyperlipidemia 11/20/2015    Current Outpatient Prescriptions on File Prior to Visit  Medication Sig Dispense Refill  . glimepiride (AMARYL) 4 MG tablet Take one half tablet a day 30 tablet 11  . lisinopril (PRINIVIL,ZESTRIL) 10 MG tablet Take 1 tablet (10 mg total) by mouth daily. 30 tablet 11  . metFORMIN (GLUCOPHAGE) 500 MG tablet Take 2 tablets twice a day 120 tablet 11  . Omega-3 Fatty Acids (FISH OIL) 1000 MG CAPS Take by mouth.    . oseltamivir (TAMIFLU) 75 MG capsule Take 1 capsule (75 mg total) by mouth 2 (two) times daily. 10 capsule 0  . Pseudoephedrine-APAP 30-325 MG TABS Take 1 tablet by mouth every 6 (six) hours. 112 each 0  . simvastatin (ZOCOR) 10 MG tablet Take 1 tablet (10 mg total) by mouth daily. 90 tablet 3   No current facility-administered medications on file prior to visit.     No Known Allergies   Objective:  BP (!) 142/74 (BP Location: Right Arm, Patient Position: Sitting, Cuff Size: Small)   Pulse 64    Temp 98.3 F (36.8 C) (Oral)   Resp 16   Ht 5\' 1"  (1.549 m)   Wt 106 lb 3.2 oz (48.2 kg)   SpO2 99%   BMI 20.07 kg/m   Physical Exam  Constitutional: She is oriented to person, place, and time and well-developed, well-nourished, and in no distress.  Sitting comfortably on exam table and smiling.   HENT:  Head: Normocephalic and atraumatic.  Eyes: Conjunctivae are normal.  Neck: Normal range of motion.  Cardiovascular: Normal rate, regular rhythm and normal heart sounds.   Pulmonary/Chest: Effort normal and breath sounds normal.  Neurological: She is alert and oriented to person, place, and time. Gait normal.  Skin: Skin is warm and dry.  Psychiatric: Affect normal.  Vitals reviewed.  Results for orders placed or performed in visit on 05/20/16 (from the past 24 hour(s))  POCT CBC     Status: Abnormal   Collection Time: 05/20/16  8:38 AM  Result Value Ref Range   WBC 9.6 4.6 - 10.2 K/uL   Lymph, poc 3.8 (A) 0.6 - 3.4   POC LYMPH PERCENT 39.6 10 - 50 %L   MID (cbc) 0.6 0 - 0.9   POC MID % 6.4 0 - 12 %M   POC Granulocyte 5.2 2 - 6.9   Granulocyte percent 54.0 37 - 80 %G   RBC 4.90 4.04 - 5.48 M/uL   Hemoglobin 11.9 (A) 12.2 - 16.2  g/dL   HCT, POC 16.135.4 (A) 09.637.7 - 47.9 %   MCV 72.2 (A) 80 - 97 fL   MCH, POC 24.2 (A) 27 - 31.2 pg   MCHC 33.5 31.8 - 35.4 g/dL   RDW, POC 04.513.0 %   Platelet Count, POC 270 142 - 424 K/uL   MPV 7.1 0 - 99.8 fL    Assessment and Plan :  1. History of leukocytosis -Pt's symptoms have completely resolved. WBC has decreased to 9.6. Instructed to continue medications and follow up if symptoms return.  -Instructed to start drinking more water, needs at least 64 oz a day. Pt understands - POCT CBC   Benjiman CoreBrittany Donell Tomkins PA-C  [facility] 05/20/2016 8:41 AM

## 2016-06-26 ENCOUNTER — Ambulatory Visit (INDEPENDENT_AMBULATORY_CARE_PROVIDER_SITE_OTHER): Payer: BLUE CROSS/BLUE SHIELD | Admitting: Emergency Medicine

## 2016-06-26 VITALS — BP 136/78 | HR 87 | Temp 99.3°F | Resp 16 | Ht 61.0 in | Wt 109.0 lb

## 2016-06-26 DIAGNOSIS — J111 Influenza due to unidentified influenza virus with other respiratory manifestations: Secondary | ICD-10-CM | POA: Diagnosis not present

## 2016-06-26 DIAGNOSIS — B349 Viral infection, unspecified: Secondary | ICD-10-CM

## 2016-06-26 MED ORDER — OSELTAMIVIR PHOSPHATE 75 MG PO CAPS: 75.0000 mg | ORAL_CAPSULE | Freq: Two times a day (BID) | ORAL | 0 refills | Status: DC

## 2016-06-26 MED ORDER — NAPROXEN 500 MG PO TABS: 500.0000 mg | ORAL_TABLET | Freq: Two times a day (BID) | ORAL | 0 refills | Status: AC

## 2016-06-26 NOTE — Patient Instructions (Addendum)
     IF you received an x-ray today, you will receive an invoice from [facility]. Please contact [facility] at [phone] with questions or concerns regarding your invoice.   IF you received labwork today, you will receive an invoice from [facility]. Please contact [facility] at [phone] with questions or concerns regarding your invoice.   Our billing staff will not be able to assist you with questions regarding bills from these companies.  You will be contacted with the lab results as soon as they are available. The fastest way to get your results is to activate your My Chart account. Instructions are located on the last page of this paperwork. If you have not heard from us regarding the results in 2 weeks, please contact this office.      Influenza, Adult Influenza ("the flu") is an infection in the lungs, nose, and throat (respiratory tract). It is caused by a virus. The flu causes many common cold symptoms, as well as a high fever and body aches. It can make you feel very sick. The flu spreads easily from person to person (is contagious). Getting a flu shot (influenza vaccination) every year is the best way to prevent the flu. Follow these instructions at home:  Take over-the-counter and prescription medicines only as told by your doctor.  Use a cool mist humidifier to add moisture (humidity) to the air in your home. This can make it easier to breathe.  Rest as needed.  Drink enough fluid to keep your pee (urine) clear or pale yellow.  Cover your mouth and nose when you cough or sneeze.  Wash your hands with soap and water often, especially after you cough or sneeze. If you cannot use soap and water, use hand sanitizer.  Stay home from work or school as told by your doctor. Unless you are visiting your doctor, try to avoid leaving home until your fever has been gone for 24 hours without the use of medicine.  Keep all follow-up visits as told by your doctor.  This is important. How is this prevented?  Getting a yearly (annual) flu shot is the best way to avoid getting the flu. You may get the flu shot in late summer, fall, or winter. Ask your doctor when you should get your flu shot.  Wash your hands often or use hand sanitizer often.  Avoid contact with people who are sick during cold and flu season.  Eat healthy foods.  Drink plenty of fluids.  Get enough sleep.  Exercise regularly. Contact a doctor if:  You get new symptoms.  You have:  Chest pain.  Watery poop (diarrhea).  A fever.  Your cough gets worse.  You start to have more mucus.  You feel sick to your stomach (nauseous).  You throw up (vomit). Get help right away if:  You start to be short of breath or have trouble breathing.  Your skin or nails turn a bluish color.  You have very bad pain or stiffness in your neck.  You get a sudden headache.  You get sudden pain in your face or ear.  You cannot stop throwing up. This information is not intended to replace advice given to you by your health care provider. Make sure you discuss any questions you have with your health care provider. Document Released: 03/26/2008 Document Revised: 11/23/2015 Document Reviewed: 04/11/2015 Elsevier Interactive Patient Education  2017 Elsevier Inc.  

## 2016-06-26 NOTE — Progress Notes (Signed)
Cathy Robbins 60 y.o.   Chief Complaint  Patient presents with  . Fever    x 1 day  . Dizziness  . Generalized Body Aches    HISTORY OF PRESENT ILLNESS: This is a 60 y.o. female complaining of dizziness, fever, and generalized body aches since yesterday.  Influenza  This is a new problem. The current episode started yesterday. The problem occurs constantly. The problem has been gradually worsening. Associated symptoms include arthralgias, chills, coughing, fatigue, a fever, headaches, myalgias and a sore throat. Pertinent negatives include no abdominal pain, anorexia, chest pain, congestion, diaphoresis, nausea, neck pain, rash, swollen glands, urinary symptoms or vomiting. The symptoms are aggravated by exertion, bending and walking. She has tried nothing for the symptoms.     Prior to Admission medications   Medication Sig Start Date End Date Taking? Authorizing Provider  glimepiride (AMARYL) 4 MG tablet Take one half tablet a day 01/12/16  Yes Collene Gobble, MD  lisinopril (PRINIVIL,ZESTRIL) 10 MG tablet Take 1 tablet (10 mg total) by mouth daily. 01/12/16  Yes Collene Gobble, MD  metFORMIN (GLUCOPHAGE) 500 MG tablet Take 2 tablets twice a day 01/12/16  Yes Collene Gobble, MD  Omega-3 Fatty Acids (FISH OIL) 1000 MG CAPS Take by mouth.   Yes Historical Provider, MD  oseltamivir (TAMIFLU) 75 MG capsule Take 1 capsule (75 mg total) by mouth 2 (two) times daily. 05/17/16  Yes Magdalene River, PA-C  simvastatin (ZOCOR) 10 MG tablet Take 1 tablet (10 mg total) by mouth daily. 01/12/16  Yes Collene Gobble, MD    No Known Allergies  Patient Active Problem List   Diagnosis Date Noted  . Type 2 diabetes mellitus without complication, without long-term current use of insulin (HCC) 11/20/2015  . Hyperlipidemia 11/20/2015    Past Medical History:  Diagnosis Date  . Diabetes mellitus without complication (HCC)     No past surgical history on file.  Social History   Social History  . Marital  status: Married    Spouse name: N/A  . Number of children: N/A  . Years of education: N/A   Occupational History  . Not on file.   Social History Main Topics  . Smoking status: Never Smoker  . Smokeless tobacco: Never Used  . Alcohol use No  . Drug use: No  . Sexual activity: Not on file   Other Topics Concern  . Not on file   Social History Narrative  . No narrative on file    No family history on file.   Review of Systems  Constitutional: Positive for chills, fatigue and fever. Negative for diaphoresis.  HENT: Positive for sore throat. Negative for congestion.   Eyes: Negative.   Respiratory: Positive for cough.   Cardiovascular: Negative for chest pain.  Gastrointestinal: Negative for abdominal pain, anorexia, nausea and vomiting.  Genitourinary: Negative.   Musculoskeletal: Positive for arthralgias and myalgias. Negative for neck pain.  Skin: Negative for rash.  Neurological: Positive for headaches.  Endo/Heme/Allergies: Negative.   Psychiatric/Behavioral: Negative.    Vitals:   06/26/16 1647  BP: 136/78  Pulse: 87  Resp: 16  Temp: 99.3 F (37.4 C)     Physical Exam  Constitutional: She is oriented to person, place, and time. She appears well-developed and well-nourished.  HENT:  Head: Normocephalic and atraumatic.  Eyes: Conjunctivae and EOM are normal. Pupils are equal, round, and reactive to light.  Neck: Normal range of motion. Neck supple.  Cardiovascular: Normal rate, regular  rhythm and intact distal pulses.   Pulmonary/Chest: Effort normal and breath sounds normal.  Abdominal: Soft. Bowel sounds are normal.  Musculoskeletal: Normal range of motion.  Neurological: She is alert and oriented to person, place, and time.  Skin: Skin is warm and dry.  Psychiatric: She has a normal mood and affect. Her behavior is normal.  Vitals reviewed.    ASSESSMENT & PLAN: Amoreena was seen today for fever, dizziness and generalized body aches.  Diagnoses and  all orders for this visit:  Viral illness -     oseltamivir (TAMIFLU) 75 MG capsule; Take 1 capsule (75 mg total) by mouth 2 (two) times daily.  Other orders -     naproxen (NAPROSYN) 500 MG tablet; Take 1 tablet (500 mg total) by mouth 2 (two) times daily with a meal.    Suspected influenza. Influenza with respiratory manifestation  Viral illness - Plan: oseltamivir (TAMIFLU) 75 MG capsule, Care order/instruction:    Patient Instructions       IF you received an x-ray today, you will receive an invoice from [facility]. Please contact [facility] at [phone] with questions or concerns regarding your invoice.   IF you received labwork today, you will receive an invoice from [facility]. Please contact [facility] at [phone] with questions or concerns regarding your invoice.   Our billing staff will not be able to assist you with questions regarding bills from these companies.  You will be contacted with the lab results as soon as they are available. The fastest way to get your results is to activate your My Chart account. Instructions are located on the last page of this paperwork. If you have not heard from us regarding the results in 2 weeks, please contact this office.      Influenza, Adult Influenza ("the flu") is an infection in the lungs, nose, and throat (respiratory tract). It is caused by a virus. The flu causes many common cold symptoms, as well as a high fever and body aches. It can make you feel very sick. The flu spreads easily from person to person (is contagious). Getting a flu shot (influenza vaccination) every year is the best way to prevent the flu. Follow these instructions at home:  Take over-the-counter and prescription medicines only as told by your doctor.  Use a cool mist humidifier to add moisture (humidity) to the air in your home. This can make it easier to breathe.  Rest as needed.  Drink enough fluid to keep your pee (urine)  clear or pale yellow.  Cover your mouth and nose when you cough or sneeze.  Wash your hands with soap and water often, especially after you cough or sneeze. If you cannot use soap and water, use hand sanitizer.  Stay home from work or school as told by your doctor. Unless you are visiting your doctor, try to avoid leaving home until your fever has been gone for 24 hours without the use of medicine.  Keep all follow-up visits as told by your doctor. This is important. How is this prevented?  Getting a yearly (annual) flu shot is the best way to avoid getting the flu. You may get the flu shot in late summer, fall, or winter. Ask your doctor when you should get your flu shot.  Wash your hands often or use hand sanitizer often.  Avoid contact with people who are sick during cold and flu season.  Eat healthy foods.  Drink plenty of fluids.  Get enough sleep.  Exercise regularly. Contact a doctor if:  You get new symptoms.  You have:  Chest pain.  Watery poop (diarrhea).  A fever.  Your cough gets worse.  You start to have more mucus.  You feel sick to your stomach (nauseous).  You throw up (vomit). Get help right away if:  You start to be short of breath or have trouble breathing.  Your skin or nails turn a bluish color.  You have very bad pain or stiffness in your neck.  You get a sudden headache.  You get sudden pain in your face or ear.  You cannot stop throwing up. This information is not intended to replace advice given to you by your health care provider. Make sure you discuss any questions you have with your health care provider. Document Released: 03/26/2008 Document Revised: 11/23/2015 Document Reviewed: 04/11/2015 Elsevier Interactive Patient Education  2017 Elsevier Inc.    Edwina BarthMiguel Bravlio Luca, MD [facility]

## 2017-01-13 ENCOUNTER — Other Ambulatory Visit: Payer: Self-pay | Admitting: Emergency Medicine

## 2017-01-13 NOTE — Telephone Encounter (Signed)
No answer letter sent

## 2017-01-21 ENCOUNTER — Ambulatory Visit (INDEPENDENT_AMBULATORY_CARE_PROVIDER_SITE_OTHER): Payer: BLUE CROSS/BLUE SHIELD | Admitting: Physician Assistant

## 2017-01-21 ENCOUNTER — Encounter: Payer: Self-pay | Admitting: Physician Assistant

## 2017-01-21 VITALS — BP 130/72 | HR 80 | Temp 98.5°F | Resp 16 | Ht 61.0 in | Wt 113.6 lb

## 2017-01-21 DIAGNOSIS — Z1159 Encounter for screening for other viral diseases: Secondary | ICD-10-CM

## 2017-01-21 DIAGNOSIS — E119 Type 2 diabetes mellitus without complications: Secondary | ICD-10-CM

## 2017-01-21 DIAGNOSIS — M25512 Pain in left shoulder: Secondary | ICD-10-CM | POA: Diagnosis not present

## 2017-01-21 DIAGNOSIS — I10 Essential (primary) hypertension: Secondary | ICD-10-CM | POA: Diagnosis not present

## 2017-01-21 DIAGNOSIS — E785 Hyperlipidemia, unspecified: Secondary | ICD-10-CM

## 2017-01-21 DIAGNOSIS — M25511 Pain in right shoulder: Secondary | ICD-10-CM

## 2017-01-21 DIAGNOSIS — Z79899 Other long term (current) drug therapy: Secondary | ICD-10-CM | POA: Diagnosis not present

## 2017-01-21 MED ORDER — SIMVASTATIN 10 MG PO TABS: 10.0000 mg | ORAL_TABLET | Freq: Every day | ORAL | 3 refills | Status: DC

## 2017-01-21 MED ORDER — GLIMEPIRIDE 2 MG PO TABS: 2.0000 mg | ORAL_TABLET | Freq: Every day | ORAL | 6 refills | Status: DC

## 2017-01-21 MED ORDER — LISINOPRIL 10 MG PO TABS: ORAL_TABLET | ORAL | 6 refills | Status: DC

## 2017-01-21 MED ORDER — METFORMIN HCL 500 MG PO TABS: ORAL_TABLET | ORAL | 11 refills | Status: DC

## 2017-01-21 MED ORDER — MELOXICAM 7.5 MG PO TABS: 7.5000 mg | ORAL_TABLET | Freq: Every day | ORAL | 0 refills | Status: DC

## 2017-01-21 NOTE — Progress Notes (Signed)
Cathy Robbins  MRN: [id number] DOB: 07-25-1955  PCP: System, Provider Not In  Subjective:  Pt is a 61 year old female PMH HLD and DM who presents to clinic for shoulder pain and medication refill. She speaks [place]. She is here today with her husband who is interpreting for her. Last OV was > 1 year ago.   HLD - Simvistatin 57m qd. Reports compliance.  DM x 2 years- Glimepiride 267mand Metformin 500 mg bid. Reports compliance. She is taking blood sugars at home occasionally: 266, 171 and 196. Denies polyuria, polydipsia, polyphagia, n/t of extremities, vision changes.  HTN - Lisinopril 1031md. Reports compliance. Blood pressure today is 130/72.  Denies headache, chest pain, palpitations, leg swelling.   C/o b/l shoulder pain. 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 Shoulder pain is better with massage and ibuprofen. Denies muscle weakness, n/t, decreased ROM.  C/o occasional dizziness. This is not a new problem. Does not happen every day. She does not drink much water. Denies syncope, headache, vision changes.   Multiple care gaps including colonoscopy, mammogram and DM opthalmology exam.   Review of Systems  Respiratory: Negative for cough and shortness of breath.   Cardiovascular: Negative for chest pain, palpitations and leg swelling.  Endocrine: Negative for polydipsia, polyphagia and polyuria.  Musculoskeletal: Positive for myalgias (b/l shoulders). Negative for arthralgias, back pain, neck pain and neck stiffness.  Neurological: Positive for dizziness. Negative for weakness, light-headedness, numbness and headaches.    Patient Active Problem List   Diagnosis Date Noted  . Type 2 diabetes mellitus without complication, without long-term current use of insulin (HCCJolley5/22/2017  . Hyperlipidemia 11/20/2015    Current Outpatient Prescriptions on File Prior to Visit  Medication Sig Dispense Refill  . glimepiride (AMARYL) 2 MG tablet TAKE 1 TABLET BY MOUTH EVERY DAY  30 tablet 0  . lisinopril (PRINIVIL,ZESTRIL) 10 MG tablet TAKE 1 TABLET(10 MG) BY MOUTH DAILY 30 tablet 0  . metFORMIN (GLUCOPHAGE) 500 MG tablet Take 2 tablets twice a day 120 tablet 11  . Omega-3 Fatty Acids (FISH OIL) 1000 MG CAPS Take by mouth.    . simvastatin (ZOCOR) 10 MG tablet Take 1 tablet (10 mg total) by mouth daily. 90 tablet 3   No current facility-administered medications on file prior to visit.     No Known Allergies   Objective:  BP 130/72 (BP Location: Left Arm, Cuff Size: Normal)   Pulse 80   Temp 98.5 F (36.9 C) (Oral)   Resp 16   Ht '5\' 1"'  (1.549 m)   Wt 113 lb 9.6 oz (51.5 kg)   SpO2 98%   BMI 21.46 kg/m  Diabetic Foot Exam - Simple   Simple Foot Form Diabetic Foot exam was performed with the following findings:  Yes 01/21/2017  5:01 PM  Visual Inspection No deformities, no ulcerations, no other skin breakdown bilaterally:  Yes Sensation Testing Intact to touch and monofilament testing bilaterally:  Yes Pulse Check Posterior Tibialis and Dorsalis pulse intact bilaterally:  Yes Comments     Lab Results  Component Value Date   HGBA1C 9.6 11/20/2015   Lab Results  Component Value Date   CHOL 227 (H) 11/20/2015   Lab Results  Component Value Date   HDL 36 (L) 11/20/2015   Lab Results  Component Value Date   LDLCALC NOT CALC 11/20/2015   Lab Results  Component Value Date   TRIG 878 (H) 11/20/2015   Lab  Results  Component Value Date   CHOLHDL 6.3 (H) 11/20/2015   No results found for: LDLDIRECT  Physical Exam  Constitutional: She is oriented to person, place, and time and well-developed, well-nourished, and in no distress. No distress.  Cardiovascular: Normal rate, regular rhythm, normal heart sounds, intact distal pulses and normal pulses.   Musculoskeletal:       Right shoulder: She exhibits spasm. She exhibits normal range of motion, no tenderness, no bony tenderness, no deformity and no pain.       Left shoulder: She exhibits spasm.  She exhibits normal range of motion, no tenderness, no bony tenderness and no pain.  Neurological: She is alert and oriented to person, place, and time. GCS score is 15.  Skin: Skin is warm and dry.  Psychiatric: Mood, memory, affect and judgment normal.  Vitals reviewed.   Assessment and Plan :  1. Encounter for medication management 2. Type 2 diabetes mellitus without complication, without long-term current use of insulin (HCC) - HM Diabetes Foot Exam - Hemoglobin A1c - Microalbumin, urine - CMP14+EGFR - glimepiride (AMARYL) 2 MG tablet; Take 1 tablet (2 mg total) by mouth daily.  Dispense: 60 tablet; Refill: 6 - metFORMIN (GLUCOPHAGE) 500 MG tablet; Take 2 tablets twice a day  Dispense: 120 tablet; Refill: 11 - Ambulatory referral to Ophthalmology - She is feeling well today and reports medication compliance. Last A1C 9.7% >1 year ago. Pt has never had DM opthalmology exam. Will put in referral today. Con't medications as directed. Labs are pending, will contact with results.  3. Hyperlipidemia, unspecified hyperlipidemia type 4. Essential hypertension - simvastatin (ZOCOR) 10 MG tablet; Take 1 tablet (10 mg total) by mouth daily.  Dispense: 90 tablet; Refill: 3 - Lipid panel - lisinopril (PRINIVIL,ZESTRIL) 10 MG tablet; TAKE 1 TABLET(10 MG) BY MOUTH DAILY  Dispense: 60 tablet; Refill: 6 - Last lipids were severely elevated, however she was not taking medication at the time. Labs are pending. Will consider increasing dose.   5. Need for hepatitis C screening test - Hepatitis C antibody  6. Pain of both shoulder joints - meloxicam (MOBIC) 7.5 MG tablet; Take 1 tablet (7.5 mg total) by mouth daily. Max dose 14m/day  Dispense: 60 tablet; Refill: 0 - Advised heat and/or ice as needed for pain, massage and hydration. RTC if no improvement in 6-8 weeks.    WMercer Pod PA-C  [facility]7/24/2018 5:07 PM

## 2017-01-21 NOTE — Patient Instructions (Addendum)
Apply a heating pad to your shoulders. You may use ice. Put ice in a plastic bag. Place a towel between your skin and the bag or between your plaster splint and the bag. Leave the ice on for 20 minutes, 2-3 times a day. Ibuprofen and/or Tylenol for pain.  Meloxicam 7.5mg  for your shoulder pain. Take this 1-2 times a day. Do not use with any other otc pain medication other than tylenol/acetaminophen - so no aleve, ibuprofen, motrin, advil, etc.  You will receive a phone call to schedule an appointment for your routine diabetic eye exam.   When you are dizzy, take your blood sugar to see if it is high or low. Drink water 1-2 liters/day. Be sure you are eating several small meals daily.   Please make an appointment with a dentist for a cleaning of your teeth.   We recommend that you schedule a mammogram for breast cancer screening. Typically, you do not need a referral to do this. Please contact a local imaging center to schedule your mammogram.  [facility] - [phone]  *ask for the Radiology Department [facility]) - [phone] or [phone]  [facility] - [phone] [facility] - [phone] [facility] - [phone]  *ask for the Radiology Department [facility] - [phone]  *ask for the Radiology Department [facility] - [phone]  *ask for the Mammography Department [facility] - [phone]  Ch? ?? ?n h?n ch? ch?t be?o va? cholesterol Fat and Cholesterol Restricted Diet N?ng ?? ch?t bo v cholesterol cao trong mu cu?a quy? vi? c th? d?n ??n cc v?n ?? s?c kh?e khc nhau, ch?ng h?n nh? cc b?nh v? tim, m?ch mu, ti m?t, gan v tuy?n t?y. Ch?t bo l ca?c ngu?n n?ng l??ng t?p trung t?n t?i ? nhi?u d?ng khc nhau. M?t s? lo?i ch?t bo nh?t ??nh, bao g?m ch?t bo bo ha, c th? gy h?i khi th??a. Cholesterol l m?t ch?t m c? th? c?n ??n v?i m?t l??ng nh?. C?  th? c?a quy? vi? ta?o ra t?t c? cholesterol c?n thi?t. Cholesterol d? th?a do th?c ph?m quy? vi? ?n. Khi quy? vi? c n?ng ?? cholesterol v ch?t bo bo ha cao trong mu, cc v?n ?? s?c kh?e c th? pht sinh v ch?t bo v cholesterol d? th??a s? ti?ch tu? d?c theo tha?nh cc m?ch mu, khi?n cc m?ch mu ? bi? he?p la?i. L?a ch?n ?ng lo?i th?c ph?m s? gip quy? vi? ki?m sot l??ng ch?t bo v cholesterol ?n va?o. ?i?u ny s? gip gi? cho n?ng ?? cc ch?t na?y trong mu c?a quy? vi? n?m trong gi?i h?n bnh th??ng v lm gi?m nguy c? m?c b?nh. K? ho?ch c?a ti l g? Chuyn gia ch?m [place] s?c kh?e c th? khuy?n ngh? quy? vi?:  H?n ch? l??ng ch?t bo tiu th? ?? m??c t? ______% tr? xu?ng theo t?ng l??ng calo m?i ngy.  H?n ch? l??ng cholesterol trong ch? ?? ?n c?a quy? vi? ?? m??c d??i _________mg m?i ngy.  ?n t? 20-30 gam ch?t x? m?i ngy.  Ti nn ch?n nh?ng lo?i ch?t bo no?  Ch?n cc ch?t bo t?t cho s?c kh?e th??ng xuyn h?n. Ch?n ch?t bo khng bo ha ??n v ch?t be?o khng ba?o ho?a ?a, ch?ng h?n nh? d?u  liu v d?u canola, h?t lanh, qu? c ch, h?nh nhn v cc lo?i h?t.  ?n thm ch?t bo  omega-3. Nh??ng l?a ch?n h??p ly? bao g?m c h?i, c thu, c mi, c ng?, d?u h?t lanh va? h?t lanh nghi?n. ???t mu?c tiu ?n c t nh?t hai l?n m?t tu?n.  H?n ch? cc ch?t bo bo ha. Ch?t bo bo ha ch? y?u ???c tm th?y trong cc s?n ph?m ??ng v?t, nh? th?t, b? v kem. Ca?c ngu?n ch?t bo bo ha t?? th??c v?t bao g?m d?u c?, d?u h?t c? v d?u d?a.  [place] cc th?c ph?m co? ch??a cc lo?i d?u hydro ha m?t ph?n. Nh?ng th?c ph?m ny ch?a ch?t bo chuy?n ha. V d? v? th?c ph?m ch?a ch?t bo chuy?n ho?a l b? th?c v?t, m?t s? b? th?c v?t ?o?ng h?p, bnh quy, bnh quy gin v bnh n??ng khc. Nh?ng nguyn t?c chung ti c?n tun theo l g? Nh?ng h??ng d?n cho vi?c ?n u?ng lnh m?nh na?y s? gip quy? vi? ki?m sot l??ng ch?t be?o v cholesterol ?n va?o:  Ki?m tra nhn th?c ph?m c?n th?n ?? nh?n  bi?t th?c ph?m c ch?a ch?t bo chuy?n ha ho?c c hm l??ng ch?t bo bo ha cao.  Cho rau v sa lt rau xanh vo m?t n?a ??a ??ng th?c ?n.  Cho ngu? c?c nguyn ca?m va?o m?t ph?n t? ??a. Hy tm t?? "whole" (nguyn ca?m) l t? ??u tin trong danh sch thnh ph?n th?c ?n.  Cho th?c ?n c protein thi?t na?c va?o m?t ph?n t? ??a.  H?n ch? tri cy ?? m??c hai b?a m?t ngy. Ch?n tri cy thay v n??c tri cy.  ?n nhi?u th?c ph?m c ch?a ch?t x? nh? to, bng c?i xanh, c r?t, ??u, ??u H-Lan v ??i m?ch.  ?n nhi?u th?c ?n n?u t?i nh v i?t th??c ?n ?? nh hng, th??c ?n t? ch?n v th?c ?n nhanh.  H?n ch? ho?c trnh u?ng r??u.  Ha?n ch? th?c ph?m co? nhi?u tinh b?t v ???ng.  H?n ch? th?c ph?m chin.  N?u ?n b?ng cc ph??ng php khc thay vi? chin. Bo? lo?, lu?c, n??ng v hun ??u l nh??ng l?a ch?n tuy?t v?i.  Gi?m cn n?u qu v? th?a cn. Gia?m ch? 5-10% cn n?ng c? th? ban ??u c th? gip i?ch cho s?c kh?e t?ng th? c?a quy? vi? v ng?n ng?a cc b?nh nh? ti?u ???ng v b?nh tim.  Ti c th? ?n nh?ng th?c ph?m no? Ng? c?c nguyn h?t  Ng? c?c nguyn ca?m nh? la m nguyn ca?m ho??c ho?c bnh m nguyn ca?m, bnh quy gin, ng? c?c v m ?ng. B?t y?n m?ch khng ???ng, mi? bulgur, la m?ch, dim [place]?ch (quinoa) ho?c g?o l??t. Ng ho?c bnh b?t m nguyn ca?m. Derrek Gu c?  Derrek Gu c? t??i ho?c ?ng l?nh (ch?a ch? bi?n, h?p, bo? lo? ho?c n??ng). Ferne Coe tr?n. Tri cy  T?t c? cc lo?i tra?i cy t??i, ?ng h?p (d???i da?ng n??c p t? nhin) ho?c ?ng l?nh. Th?t v nh?ng ngu?n th?c ph?m ch?a protein khc  Th?t b xay (85% ho?c na?c h?n), thi?t bo? ?n c? ho?c th?t b c?t bo? m??. Ga? ho??c ga? ty bo? da. G ho?c g ty xay. Th?t l??n c??t bo? m??. T?t c? c v h?i s?n. Tr?ng. Cc lo?i ??u, ??u H Lan ho??c ??u l?ng s?y kh. Cc lo?i qu? h?ch v h?t khng ??p mu?i. ??u ?ng h?p ho??c ??u s?y kh khng ??p mu?i. B? s?a  Cc s?n ph?m t? s?a t bo nh? s?a g?y ho?c s?a 1%,  2% ho?c pho mt  t bo, ricotta i?t bo ho?c pho mt t??i ho?c s?a chua t bo. M? v d?u  B? th?c v?t ?ng h?p khng c ch?t bo chuy?n ha. Mayonnaise v n???c tr?n rau tr?n nh? ho??c i?t ch?t be?o. Qu? b?. D?u  liu, d?u canola, d?u m ho??c d?u hoa rum. B? ??u ph?ng ho?c h?nh nhn thin nhin (ch?n nh?ng loa?i khng co? thm ???ng v d?u). Nh?ng th?c ph?m li?t k ? trn c th? khng ph?i l m?t danh m?c ??y ?? cc th?c ph?m v ?? u?ng ???c khuy?n ngh?Juel Burrow. Lin h? v?i chuyn gia dinh d??ng ?? c thm s? l?a ch?n. Cc th?c ph?m c?n trnh Ng? c?c nguyn h?t  Bnh m tr?ng. M tr?ng. G?o tr?ng. Bnh m ng. Bnh m trn, bnh ng?t v bnh s?ng b. Bnh quy gin ch?a ch?t bo chuy?n ha. Derrek GuRau c?  Khoai ty tr?ng. Richrd SoxNg. Rau tr?n kem ho??c rau xa?o. Cc lo?i rau tr?n n??c s?t pho mt. Tri cy  Tri cy s?y kh. Tri cy ?ng h?p ngm xi-r loa?ng ho??c ???c. N??c p tri cy. Th?t v nh?ng ngu?n th?c ph?m ch?a protein khc  La?t thi?t m??. X??ng s??n, cnh g, th?t xng khi, xc xch, xc xch hun khi, xc xch Y?, lo?ng l??n, m? l?ng l??n, xc xch hot dog, xc xi?ch ?? ra?n v th?t ?n tr?a ?ng gi. Gan v thi?t n?i ta?ng. B? s?a  S??a nguyn kem ho??c 2%, kem, h?n h??p s??a nguyn kem va? kem t??i v pho mt kem. Pho mt s?a nguyn kem. S??a chua nguyn ch?t bo ho?c s?a chua co? ????ng. Pho mt nguyn ch?t bo. B?t kem khng s??a v l??p phu? kem ?a? ?a?nh bng. Pho mt ch? bi?n, ph?t pho mt, ho??c s??a ?ng pho mt. ?? u?ng  R??u. ?? u?ng c ???ng (nh? soda, n??c chanh v n??c tri cy ho?c r???u pn). M? v d?u  B?, b? th?c v?t da?ng tho?i, m? l?n, m?? pha ba?nh, b? s?a tru l?ng ho?c ch?t bo th?t xng khi. D?u d?a, h?t c?, ho?c c?. K?o v ?? trng mi?ng  Xi-r ng, ???ng, m?t ong v m?t ????ng. K?o. M??t v th?ch. Xi r. Ngu? c?c co? ????ng. Ba?nh quy, bnh n??ng, bnh ng?t, bnh rn, bnh n??ng x?p v kem. Nh?ng th?c ph?m li?t k ? trn c th? khng ph?i l m?t danh m?c ??y ?? cc th?c  ph?m v ?? u?ng c?n trnh. Lin h? v?i chuyn gia dinh d??ng ?? c thm thng tin. Thng tin ny khng nh?m m?c ?ch thay th? cho l?i khuyn m chuyn gia ch?m [place] s?c kh?e ni v?i qu v?. Hy b?o ??m qu v? ph?i th?o lu?n b?t k? v?n ?? g m qu v? c v?i chuyn gia ch?m [place] s?c kh?e c?a qu v?. Document Released: 10/09/2015 Document Revised: 07/05/2016 Document Reviewed: 09/15/2013 Elsevier Interactive Patient Education  2017 Elsevier Inc.   IF you received an x-ray today, you will receive an invoice from [facility]. Please contact [facility] at [phone] with questions or concerns regarding your invoice.   IF you received labwork today, you will receive an invoice from [facility]. Please contact [facility] at [phone] with questions or concerns regarding your invoice.   Our billing staff will not be able to assist you with questions regarding bills from these companies.  You will be contacted with the lab results as soon as they are available. The fastest way to get your results is to activate  your My Chart account. Instructions are located on the last page of this paperwork. If you have not heard from [place] regarding the results in 2 weeks, please contact this office.

## 2017-01-22 LAB — CMP14+EGFR
ALT: 13 IU/L (ref 0–32)
AST: 18 IU/L (ref 0–40)
Albumin/Globulin Ratio: 1.2 (ref 1.2–2.2)
Albumin: 4.2 g/dL (ref 3.6–4.8)
Alkaline Phosphatase: 69 IU/L (ref 39–117)
BUN/Creatinine Ratio: 18 (ref 12–28)
BUN: 13 mg/dL (ref 8–27)
Bilirubin Total: 0.3 mg/dL (ref 0.0–1.2)
CO2: 22 mmol/L (ref 20–29)
Calcium: 9.8 mg/dL (ref 8.7–10.3)
Chloride: 99 mmol/L (ref 96–106)
Creatinine, Ser: 0.71 mg/dL (ref 0.57–1.00)
GFR calc Af Amer: 105 mL/min/{1.73_m2} (ref >59)
GFR calc non Af Amer: 91 mL/min/{1.73_m2} (ref >59)
Globulin, Total: 3.6 g/dL (ref 1.5–4.5)
Glucose: 113 mg/dL — ABNORMAL HIGH (ref 65–99)
Potassium: 4.6 mmol/L (ref 3.5–5.2)
Sodium: 140 mmol/L (ref 134–144)
Total Protein: 7.8 g/dL (ref 6.0–8.5)

## 2017-01-22 LAB — LIPID PANEL
Chol/HDL Ratio: 9.7 ratio — ABNORMAL HIGH (ref 0.0–4.4)
Cholesterol, Total: 272 mg/dL — ABNORMAL HIGH (ref 100–199)
HDL: 28 mg/dL — ABNORMAL LOW (ref >39)
Triglycerides: 1206 mg/dL (ref 0–149)

## 2017-01-22 LAB — HEMOGLOBIN A1C
Est. average glucose Bld gHb Est-mCnc: 189 mg/dL
Hgb A1c MFr Bld: 8.2 % — ABNORMAL HIGH (ref 4.8–5.6)

## 2017-01-22 LAB — HEPATITIS C ANTIBODY: Hep C Virus Ab: <0.1 s/co ratio (ref 0.0–0.9)

## 2017-01-23 ENCOUNTER — Other Ambulatory Visit: Payer: Self-pay | Admitting: Physician Assistant

## 2017-01-23 NOTE — Progress Notes (Signed)
Please call pt and have her come when convenient in to discuss lab results.

## 2017-01-23 NOTE — Progress Notes (Signed)
Very high triglycerides. Will start pt on triple therapy. Will have pt RTC to discuss results.  Start Garlic supplement, gemfibrozil 600 mg twice daily 30 minutes before breakfast and dinner. Change statin therapy to Atorvastatin.  Plan to increase Metformin dose.

## 2017-01-24 ENCOUNTER — Encounter: Payer: Self-pay | Admitting: Physician Assistant

## 2017-01-24 ENCOUNTER — Telehealth: Payer: Self-pay | Admitting: *Deleted

## 2017-01-24 ENCOUNTER — Ambulatory Visit (INDEPENDENT_AMBULATORY_CARE_PROVIDER_SITE_OTHER): Payer: BLUE CROSS/BLUE SHIELD | Admitting: Physician Assistant

## 2017-01-24 VITALS — BP 130/72 | HR 78 | Temp 98.6°F | Resp 18 | Ht 60.79 in | Wt 111.6 lb

## 2017-01-24 DIAGNOSIS — E119 Type 2 diabetes mellitus without complications: Secondary | ICD-10-CM | POA: Diagnosis not present

## 2017-01-24 DIAGNOSIS — E785 Hyperlipidemia, unspecified: Secondary | ICD-10-CM | POA: Diagnosis not present

## 2017-01-24 DIAGNOSIS — R7309 Other abnormal glucose: Secondary | ICD-10-CM | POA: Diagnosis not present

## 2017-01-24 DIAGNOSIS — E782 Mixed hyperlipidemia: Secondary | ICD-10-CM | POA: Diagnosis not present

## 2017-01-24 MED ORDER — GARLIC 705 MG PO CAPS: 705.0000 mg | ORAL_CAPSULE | Freq: Every day | ORAL | 4 refills | Status: DC

## 2017-01-24 MED ORDER — GLIMEPIRIDE 4 MG PO TABS: 4.0000 mg | ORAL_TABLET | Freq: Every day | ORAL | 4 refills | Status: DC

## 2017-01-24 MED ORDER — GEMFIBROZIL 600 MG PO TABS: 600.0000 mg | ORAL_TABLET | Freq: Two times a day (BID) | ORAL | 4 refills | Status: DC

## 2017-01-24 MED ORDER — ATORVASTATIN CALCIUM 40 MG PO TABS: 40.0000 mg | ORAL_TABLET | Freq: Every day | ORAL | 3 refills | Status: DC

## 2017-01-24 MED ORDER — METFORMIN HCL 850 MG PO TABS: 850.0000 mg | ORAL_TABLET | Freq: Two times a day (BID) | ORAL | 4 refills | Status: DC

## 2017-01-24 NOTE — Telephone Encounter (Signed)
[facility] [phone] to discontinue Metformin 850 mg, per [place].

## 2017-01-24 NOTE — Patient Instructions (Addendum)
M?c cholesterol c?a b?n r?t cao (xem bn d??i v ?nh km). STOP TAKING SIMVASTATIN. B?t ??u dng t?isupplement, gemfibrozil 600 mg hai l?n m?i ngy tr??c b?a sng v b?a t?i. Thay ??i li?u php statin thnh Atorvastatin.    B?nh ti?u ???ng: T?ng Glimepiride 65m m?i ngy  Quay l?i v g?p ti trong 1 thng. Hy nh?n ?n (??ng ?n tr??c khi b?n ??n). Chng ti s? ki?m tra l?i l??ng cholesterol v l??ng ???ng trong mu c?a b?n   Lab Results  Component Value Date   CHOL 272 (H) 01/21/2017   HDL 28 (L) 01/21/2017   [place] 01/21/2017   TRIG 1,206 ([place] 01/21/2017   CHOLHDL 9.7 (H) 01/21/2017   Thank you for coming in today. I hope you feel we met your needs.  Feel free to call PCP if you have any questions or further requests.  Please consider signing up for MyChart if you do not already have it, as this is a great way to communicate with me.  Best,  Whitney McVey, PA-C  Food Choices to Lower Your Triglycerides Triglycerides are a type of fat in your blood. High levels of triglycerides can increase the risk of heart disease and stroke. If your triglyceride levels are high, the foods you eat and your eating habits are very important. Choosing the right foods can help lower your triglycerides. What general guidelines do I need to follow?  Lose weight if you are overweight.  Limit or avoid alcohol.  Fill one half of your plate with vegetables and green salads.  Limit fruit to two servings a day. Choose fruit instead of juice.  Make one fourth of your plate whole grains. Look for the word "whole" as the first word in the ingredient list.  Fill one fourth of your plate with lean protein foods.  Enjoy fatty fish (such as salmon, mackerel, sardines, and tuna) three times a week.  Choose healthy fats.  Limit foods high in starch and sugar.  Eat more home-cooked food and less restaurant, buffet, and fast food.  Limit fried foods.  Cook foods using methods other than  frying.  Limit saturated fats.  Check ingredient lists to avoid foods with partially hydrogenated oils (trans fats) in them. What foods can I eat? Grains Whole grains, such as whole wheat or whole grain breads, crackers, cereals, and pasta. Unsweetened oatmeal, bulgur, barley, quinoa, or brown rice. Corn or whole wheat flour tortillas. Vegetables Fresh or frozen vegetables (raw, steamed, roasted, or grilled). Green salads. Fruits All fresh, canned (in natural juice), or frozen fruits. Meat and Other Protein Products Ground beef (85% or leaner), grass-fed beef, or beef trimmed of fat. Skinless chicken or t[place] Ground chicken or t[place] Pork trimmed of fat. All fish and seafood. Eggs. Dried beans, peas, or lentils. Unsalted nuts or seeds. Unsalted canned or dry beans. Dairy Low-fat dairy products, such as skim or 1% milk, 2% or reduced-fat cheeses, low-fat ricotta or cottage cheese, or plain low-fat yogurt. Fats and Oils Tub margarines without trans fats. Light or reduced-fat mayonnaise and salad dressings. Avocado. Safflower, olive, or canola oils. Natural peanut or almond butter. The items listed above may not be a complete list of recommended foods or beverages. Contact your dietitian for more options. What foods are not recommended? Grains White bread. White pasta. White rice. Cornbread. Bagels, pastries, and croissants. Crackers that contain trans fat. Vegetables White potatoes. Corn. Creamed or fried vegetables. Vegetables in a cheese sauce. Fruits Dried fruits. Canned fruit in light  or heavy syrup. Fruit juice. Meat and Other Protein Products Fatty cuts of meat. Ribs, chicken wings, bacon, sausage, bologna, salami, chitterlings, fatback, hot dogs, bratwurst, and packaged luncheon meats. Dairy Whole or 2% milk, cream, half-and-half, and cream cheese. Whole-fat or sweetened yogurt. Full-fat cheeses. Nondairy creamers and whipped toppings. Processed cheese, cheese spreads, or cheese  curds. Sweets and Desserts Corn syrup, sugars, honey, and molasses. Candy. Jam and jelly. Syrup. Sweetened cereals. Cookies, pies, cakes, donuts, muffins, and ice cream. Fats and Oils Butter, stick margarine, lard, shortening, ghee, or bacon fat. Coconut, palm kernel, or palm oils. Beverages Alcohol. Sweetened drinks (such as sodas, lemonade, and fruit drinks or punches). The items listed above may not be a complete list of foods and beverages to avoid. Contact your dietitian for more information. This information is not intended to replace advice given to you by your health care provider. Make sure you discuss any questions you have with your health care provider. Document Released: 04/04/2004 Document Revised: 11/23/2015 Document Reviewed: 04/21/2013 Elsevier Interactive Patient Education  2017 [employer].  IF you received an x-ray today, you will receive an invoice from [facility]. Please contact [facility] at [phone] with questions or concerns regarding your invoice.   IF you received labwork today, you will receive an invoice from [place]. Please contactWe recommend that you schedule a mammogram for breast cancer screening. Typically, you do not need a referral to do this. Please contact a local imaging center to schedule your mammogram.  [facility] - [phone]  *ask for the Radiology Department [facility] [place] [facility] - [phone] or [phone]  [facility] - [phone] [place] [phone] [facility] - [phone]  *ask for the [facility] - [phone]  *ask for the Radiology Department [facility] - [phone]  *ask for the [place] - [phone] [facility] at [phone] with questions or concerns regarding your invoice.   Our billing staff will not be able to assist you with questions  regarding bills from these companies.  You will be contacted with the lab results as soon as they are available. The fastest way to get your results is to activate your My Chart account. Instructions are located on the last page of this paperwork. If you have not heard from [place] regarding the results in 2 weeks, please contact this office.

## 2017-01-24 NOTE — Progress Notes (Signed)
Cathy Robbins  MRN: [id number] DOB: 09/25/1955  PCP: System, Provider Not In  Subjective:  Pt presents to clinic for f/u lab work She speaks [place]. She is here today with her husband and daughter. Mobile interpreter used today.   HLD - Simvistatin 10mg  qd. Reports compliance. C/o muscle aches.  DM x 2 years- Glimepiride 2mg  and Metformin 1,000 mg bid. Reports compliance. She is taking blood sugars at home occasionally: 266, 171 and 196. Denies polyuria, polydipsia, polyphagia, n/t of extremities, vision changes.  HTN - Lisinopril 10mg  qd. Reports compliance. Blood pressure today is 130/72. Denies headache, chest pain, palpitations, leg swelling.   Review of Systems  Eyes: Negative for visual disturbance.  Respiratory: Negative for cough and shortness of breath.   Cardiovascular: Negative for chest pain and palpitations.  Gastrointestinal: Negative for abdominal pain, diarrhea and nausea.  Endocrine: Negative for polydipsia, polyphagia and polyuria.  Musculoskeletal: Positive for arthralgias, back pain, gait problem and myalgias.    Patient Active Problem List   Diagnosis Date Noted  . Type 2 diabetes mellitus without complication, without long-term current use of insulin (HCC) 11/20/2015  . Hyperlipidemia 11/20/2015    Current Outpatient Prescriptions on File Prior to Visit  Medication Sig Dispense Refill  . glimepiride (AMARYL) 2 MG tablet Take 1 tablet (2 mg total) by mouth daily. 60 tablet 6  . lisinopril (PRINIVIL,ZESTRIL) 10 MG tablet TAKE 1 TABLET(10 MG) BY MOUTH DAILY 60 tablet 6  . meloxicam (MOBIC) 7.5 MG tablet Take 1 tablet (7.5 mg total) by mouth daily. Max dose 15mg /day 60 tablet 0  . metFORMIN (GLUCOPHAGE) 500 MG tablet Take 2 tablets twice a day 120 tablet 11  . Omega-3 Fatty Acids (FISH OIL) 1000 MG CAPS Take by mouth.    . simvastatin (ZOCOR) 10 MG tablet Take 1 tablet (10 mg total) by mouth daily. 90 tablet 3   No current facility-administered medications on  file prior to visit.     No Known Allergies   Objective:  BP (!) 153/69 (BP Location: Right Arm, Patient Position: Sitting, Cuff Size: Normal)   Pulse 78   Temp 98.6 F (37 C) (Oral)   Resp 18   Ht 5' 0.79" (1.544 m)   Wt 111 lb 9.6 oz (50.6 kg)   SpO2 97%   BMI 21.23 kg/m   Physical Exam  Constitutional: She is oriented to person, place, and time and well-developed, well-nourished, and in no distress. No distress.  Cardiovascular: Normal rate, regular rhythm and normal heart sounds.   Neurological: She is alert and oriented to person, place, and time. GCS score is 15.  Skin: Skin is warm and dry.  Psychiatric: Mood, memory, affect and judgment normal.  Vitals reviewed.  Lab Results  Component Value Date   HGBA1C 8.2 (H) 01/21/2017   Lab Results  Component Value Date   CHOL 272 (H) 01/21/2017   CHOL 227 (H) 11/20/2015   Lab Results  Component Value Date   HDL 28 (L) 01/21/2017   HDL 36 (L) 11/20/2015   Lab Results  Component Value Date   LDLCALC Comment 01/21/2017   LDLCALC NOT CALC 11/20/2015   Lab Results  Component Value Date   TRIG 1,206 (HH) 01/21/2017   TRIG 878 (H) 11/20/2015   Lab Results  Component Value Date   CHOLHDL 9.7 (H) 01/21/2017   CHOLHDL 6.3 (H) 11/20/2015   No results found for: LDLDIRECT  Assessment and Plan :  1. Elevated triglycerides with high cholesterol 2. Hyperlipidemia, unspecified  hyperlipidemia type - gemfibrozil (LOPID) 600 MG tablet; Take 1 tablet (600 mg total) by mouth 2 (two) times daily before a meal.  Dispense: 60 tablet; Refill: 4 - Garlic 705 MG CAPS; Take 1 capsule (705 mg total) by mouth daily.  Dispense: 60 capsule; Refill: 4 - atorvastatin (LIPITOR) 40 MG tablet; Take 1 tablet (40 mg total) by mouth daily.  Dispense: 90 tablet; Refill: 3 - Pt presents for f/u abnormal lab work. Plan to stop Simvastatin. Change statin therapy to Atorvastatin. Start Garlic supplement, gemfibrozil 600 mg twice daily 30 minutes  before breakfast and dinner. Encouraged low fat diet and exercise. Medication changes and information printed out for pt. F/u in 1 month for repeat fasting lipids. Will recheck lipids q monthly until levels are wnl.  3. Elevated hemoglobin A1c 4. Type 2 diabetes mellitus without complication, without long-term current use of insulin (HCC) - glimepiride (AMARYL) 4 MG tablet; Take 1 tablet (4 mg total) by mouth daily. Take with breakfast or the first main meal.  Dispense: 60 tablet; Refill: 4 - Elevated A1C. Increase Amaryl to 4mg  qd. Recheck in 3 months.   Marco CollieWhitney Zi Newbury, PA-C  Primary Care at [facility] 01/24/2017 5:13 PM

## 2017-01-28 ENCOUNTER — Ambulatory Visit: Payer: BLUE CROSS/BLUE SHIELD | Admitting: Physician Assistant

## 2017-02-03 ENCOUNTER — Ambulatory Visit (INDEPENDENT_AMBULATORY_CARE_PROVIDER_SITE_OTHER): Payer: BLUE CROSS/BLUE SHIELD | Admitting: Physician Assistant

## 2017-02-03 ENCOUNTER — Ambulatory Visit (INDEPENDENT_AMBULATORY_CARE_PROVIDER_SITE_OTHER): Payer: BLUE CROSS/BLUE SHIELD

## 2017-02-03 ENCOUNTER — Encounter: Payer: Self-pay | Admitting: Physician Assistant

## 2017-02-03 VITALS — BP 117/65 | HR 70 | Temp 98.4°F | Resp 16 | Ht 60.75 in | Wt 108.4 lb

## 2017-02-03 DIAGNOSIS — R29898 Other symptoms and signs involving the musculoskeletal system: Secondary | ICD-10-CM

## 2017-02-03 DIAGNOSIS — E119 Type 2 diabetes mellitus without complications: Secondary | ICD-10-CM

## 2017-02-03 DIAGNOSIS — M1990 Unspecified osteoarthritis, unspecified site: Secondary | ICD-10-CM

## 2017-02-03 DIAGNOSIS — M25562 Pain in left knee: Secondary | ICD-10-CM

## 2017-02-03 MED ORDER — TRAMADOL-ACETAMINOPHEN 37.5-325 MG PO TABS: 1.0000 | ORAL_TABLET | Freq: Four times a day (QID) | ORAL | 0 refills | Status: DC | PRN

## 2017-02-03 NOTE — Patient Instructions (Addendum)
You will receive a phone call to schedule an appointment with orthopedics.   B?n s? nh?n ???c m?t cu?c g?i ?i?n tho?i ?? s?p x?p m?t cu?c h?n v?i ch?nh hnh.   IF you received an x-ray today, you will receive an invoice from [facility]. Please contact [facility] at [phone] with questions or concerns regarding your invoice.   IF you received labwork today, you will receive an invoice from [facility]. Please contact [facility] at [phone] with questions or concerns regarding your invoice.   Our billing staff will not be able to assist you with questions regarding bills from these companies.  You will be contacted with the lab results as soon as they are available. The fastest way to get your results is to activate your My Chart account. Instructions are located on the last page of this paperwork. If you have not heard from us regarding the results in 2 weeks, please contact this office.

## 2017-02-03 NOTE — Progress Notes (Signed)
Cathy Robbins  MRN: [id number] DOB: Dec 28, 1955  PCP: System, Provider Not In  Subjective:  Pt is a 61 year old female PMH DM, HLD, who presents to clinic for leg and arm pain x 3 weeks. She speaks [place]. She is here today with her husband. Mobile interpreter used today.  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  Pain started in shoulders. One week later pain was in left leg and left arm. Leg is "stiff and cold and cannot move". Endorses weakness in legs, numbness and tingling. Pain is located at her left hip. Radiates down left leg.  Endorses pain in the back of her left knee.  Denies loss of bowel or bladder function, numbness of saddle region.  Meloxicam makes it better.   Review of Systems  Gastrointestinal: Negative for constipation and diarrhea.  Musculoskeletal: Positive for arthralgias and gait problem.    Patient Active Problem List   Diagnosis Date Noted  . Type 2 diabetes mellitus without complication, without long-term current use of insulin (HCC) 11/20/2015  . Hyperlipidemia 11/20/2015    Current Outpatient Prescriptions on File Prior to Visit  Medication Sig Dispense Refill  . atorvastatin (LIPITOR) 40 MG tablet Take 1 tablet (40 mg total) by mouth daily. 90 tablet 3  . Garlic 705 MG CAPS Take 1 capsule (705 mg total) by mouth daily. 60 capsule 4  . gemfibrozil (LOPID) 600 MG tablet Take 1 tablet (600 mg total) by mouth 2 (two) times daily before a meal. 60 tablet 4  . glimepiride (AMARYL) 4 MG tablet Take 1 tablet (4 mg total) by mouth daily. Take with breakfast or the first main meal. 60 tablet 4  . lisinopril (PRINIVIL,ZESTRIL) 10 MG tablet TAKE 1 TABLET(10 MG) BY MOUTH DAILY 60 tablet 6  . meloxicam (MOBIC) 7.5 MG tablet Take 1 tablet (7.5 mg total) by mouth daily. Max dose 15mg /day 60 tablet 0  . Omega-3 Fatty Acids (FISH OIL) 1000 MG CAPS Take by mouth.     No current facility-administered medications on file prior to visit.     No Known Allergies     Objective:  BP 117/65 (BP Location: Right Arm, Patient Position: Sitting, Cuff Size: Normal)   Pulse 70   Temp 98.4 F (36.9 C) (Oral)   Resp 16   Ht 5' 0.75" (1.543 m)   Wt 108 lb 6.4 oz (49.2 kg)   SpO2 98%   BMI 20.65 kg/m   Physical Exam  Constitutional: She is oriented to person, place, and time and well-developed, well-nourished, and in no distress. No distress.  Cardiovascular: Normal rate, regular rhythm and normal heart sounds.   Musculoskeletal:       Right shoulder: She exhibits spasm. She exhibits normal range of motion, no tenderness, no bony tenderness, no effusion and no deformity.       Left shoulder: She exhibits decreased range of motion (posterior abduction) and spasm. She exhibits no bony tenderness, no effusion, no crepitus and no deformity.       Right hip: She exhibits normal range of motion, normal strength, no tenderness and no bony tenderness.       Left hip: She exhibits normal range of motion, normal strength, no tenderness and no bony tenderness.       Left knee: She exhibits normal range of motion, no swelling, no effusion, no erythema, normal patellar mobility, no bony tenderness, normal meniscus and no MCL laxity. No tenderness found.       Lumbar back: She exhibits  normal range of motion, no tenderness, no bony tenderness and no spasm.  Tenderness to palpation popliteal fossa. No palpable cord. No discrepancy in leg circumference.   Neurological: She is alert and oriented to person, place, and time. She has normal strength. She has an abnormal Straight Leg Raise Test (left leg). GCS score is 15.  Antalgic gait  Skin: Skin is warm and dry.  Psychiatric: Mood, memory, affect and judgment normal.  Vitals reviewed.   Dg Lumbar Spine Complete  Result Date: 02/03/2017 CLINICAL DATA:  Lumbago with left lower extremity weakness EXAM: LUMBAR SPINE - COMPLETE 4+ VIEW COMPARISON:  None. FINDINGS: Frontal, lateral, spot lumbosacral lateral, and bilateral oblique  views were obtained. There are 5 non-rib-bearing lumbar type vertebral bodies. There is no fracture or spondylolisthesis. There are small anterior osteophytes at L3, L4, and L5. There is no appreciable disc space narrowing. There is facet osteoarthritic change at L3-4, L4-5, and L5-S1 bilaterally. IMPRESSION: Facet osteoarthritic changes several levels. No appreciable disc space narrowing. No fracture or spondylolisthesis. Electronically Signed   By: Bretta Bang III M.D.   On: 02/03/2017 11:02   Dg Knee Complete 4 Views Left  Result Date: 02/03/2017 CLINICAL DATA:  Pain EXAM: LEFT KNEE - 3  VIEW COMPARISON:  None. FINDINGS: Frontal, tunnel, and lateral views were obtained. There is no fracture. There is slight lateral patellar subluxation without dislocation. There is no appreciable joint effusion. There is moderate narrowing of the patellofemoral joint. There is mild spurring laterally and along the posterior patella. No erosive change peer IMPRESSION: Moderate patellofemoral joint space narrowing. Mild lateral patellar subluxation without dislocation. No fracture or joint effusion. Electronically Signed   By: Bretta Bang III M.D.   On: 02/03/2017 11:03    Assessment and Plan :  1. Left leg weakness 2. Left knee pain, unspecified chronicity 3. Arthritis 4. Type 2 diabetes mellitus  - DG Lumbar Spine Complete; Future - DG Knee Complete 4 Views Left; Future - traMADol-acetaminophen (ULTRACET) 37.5-325 MG tablet; Take 1 tablet by mouth every 6 (six) hours as needed.  Dispense: 30 tablet; Refill: 0 - Ambulatory referral to Orthopedic Surgery - No red flag signs or symptoms. X-rays positive for lumbar OA and knee joint space narrowing. Suspect this contributing to her pain. Will refer to ortho for evaluation. Will not Rx prednisone today due to h/o DM.   Marco Collie, PA-C  Primary Care at [facility] 02/03/2017 9:48 AM

## 2017-02-12 ENCOUNTER — Encounter (INDEPENDENT_AMBULATORY_CARE_PROVIDER_SITE_OTHER): Payer: Self-pay | Admitting: Orthopedic Surgery

## 2017-02-12 ENCOUNTER — Ambulatory Visit (INDEPENDENT_AMBULATORY_CARE_PROVIDER_SITE_OTHER): Payer: BLUE CROSS/BLUE SHIELD

## 2017-02-12 ENCOUNTER — Ambulatory Visit (INDEPENDENT_AMBULATORY_CARE_PROVIDER_SITE_OTHER): Payer: BLUE CROSS/BLUE SHIELD | Admitting: Orthopedic Surgery

## 2017-02-12 DIAGNOSIS — M25512 Pain in left shoulder: Secondary | ICD-10-CM

## 2017-02-12 DIAGNOSIS — M25562 Pain in left knee: Secondary | ICD-10-CM

## 2017-02-12 MED ORDER — LIDOCAINE HCL 1 % IJ SOLN
5.0000 mL | INTRAMUSCULAR | Status: AC | PRN
Start: 2017-02-12 — End: 2017-02-12
  Administered 2017-02-12: 5 mL

## 2017-02-12 MED ORDER — METHYLPREDNISOLONE ACETATE 40 MG/ML IJ SUSP
40.0000 mg | INTRAMUSCULAR | Status: AC | PRN
  Administered 2017-02-12: 40 mg via INTRA_ARTICULAR

## 2017-02-12 NOTE — Progress Notes (Signed)
Office Visit Note   Patient: Cathy Robbins           Date of Birth: 01-02-1956           MRN: [id number] Visit Date: 02/12/2017              Requested by: Sebastian AcheMcVey, Elizabeth Whitney, PA-C [address] [place], [place] [id number] PCP: System, Provider Not In  Chief Complaint  Patient presents with  . Left Leg - Pain, Numbness  . Left Arm - Pain, Numbness      HPI: Patient is a 61 year old woman who is seen for evaluation for left shoulder and left knee pain. Patient complains of pain with weightbearing. Patient states she's had some dizziness from her medications she is currently using Accupril Lipitor metformin tramadol glyburide and gemfibrozil. Patient is seen with an interpreter present. Assessment & Plan: Visit Diagnoses:  1. Acute pain of left shoulder   2. Acute pain of left knee     Plan: Recommended that she may increase her activities as tolerated the left shoulder was injected in the subacromial space left knee injected for pain. Anticipate this should resolve her problems.  Follow-Up Instructions: Return if symptoms worsen or fail to improve.   Ortho Exam  Patient is alert, oriented, no adenopathy, well-dressed, normal affect, normal respiratory effort. On examination patient has an antalgic gait she uses a cane the right hand. Left knee has no redness no cellulitis no effusion. She has no crepitation range of motion) cruciates are stable she is tender to palpation the popliteal fossa medial lateral joint lines are minimally tender to palpation. Examination the left shoulder she has decreased range of motion with abduction and flexion of 120 she has pain with Neer and Hawkins impingement test.  Imaging: Xr Knee 1-2 Views Left  Result Date: 02/12/2017 Two-view radiographs of the left knee shows medial joint line narrowing with periarticular bony spurs the medial joint line.  Xr Shoulder Left  Result Date: 02/12/2017 Two-view radiographs of the left shoulder show decreased  subacromial joint space with a congruent glenohumeral joint.  No images are attached to the encounter.  Labs: Lab Results  Component Value Date   HGBA1C 8.2 (H) 01/21/2017   HGBA1C 9.6 11/20/2015    Orders:  Orders Placed This Encounter  Procedures  . XR Shoulder Left  . XR Knee 1-2 Views Left   No orders of the defined types were placed in this encounter.    Procedures: Large Joint Inj Date/Time: 02/12/2017 2:45 PM Performed by: DUDA, MARCUS V Authorized by: Nadara MustardUDA, MARCUS V   Consent Given by:  Patient Site marked: the procedure site was marked   Timeout: prior to procedure the correct patient, procedure, and site was verified   Indications:  Pain and diagnostic evaluation Location:  Knee Site:  L knee Prep: patient was prepped and draped in usual sterile fashion   Needle Size:  22 G Needle Length:  1.5 inches Approach:  Anteromedial Ultrasound Guidance: No   Fluoroscopic Guidance: No   Arthrogram: No   Medications:  5 mL lidocaine 1 %; 40 mg methylPREDNISolone acetate 40 MG/ML Aspiration Attempted: No   Patient tolerance:  Patient tolerated the procedure well with no immediate complications Large Joint Inj Date/Time: 02/12/2017 2:45 PM Performed by: DUDA, MARCUS V Authorized by: Aldean BakerUDA, MARCUS V   Consent Given by:  Patient Site marked: the procedure site was marked   Timeout: prior to procedure the correct patient, procedure, and site was verified  Indications:  Pain and diagnostic evaluation Location:  Shoulder Site:  L subacromial bursa Prep: patient was prepped and draped in usual sterile fashion   Needle Size:  22 G Needle Length:  1.5 inches Approach:  Posterior Ultrasound Guidance: No   Fluoroscopic Guidance: No   Arthrogram: No   Medications:  5 mL lidocaine 1 %; 40 mg methylPREDNISolone acetate 40 MG/ML Aspiration Attempted: No   Patient tolerance:  Patient tolerated the procedure well with no immediate complications    Clinical Data: No  additional findings.  ROS:  All other systems negative, except as noted in the HPI. Review of Systems  Objective: Vital Signs: There were no vitals taken for this visit.  Specialty Comments:  No specialty comments available.  PMFS History: Patient Active Problem List   Diagnosis Date Noted  . Type 2 diabetes mellitus without complication, without long-term current use of insulin (HCC) 11/20/2015  . Hyperlipidemia 11/20/2015   Past Medical History:  Diagnosis Date  . Diabetes mellitus without complication (HCC)     No family history on file.  No past surgical history on file. Social History   Occupational History  . Not on file.   Social History Main Topics  . Smoking status: Never Smoker  . Smokeless tobacco: Never Used  . Alcohol use No  . Drug use: No  . Sexual activity: Not on file

## 2017-02-26 ENCOUNTER — Encounter: Payer: Self-pay | Admitting: Physician Assistant

## 2017-02-26 ENCOUNTER — Ambulatory Visit (INDEPENDENT_AMBULATORY_CARE_PROVIDER_SITE_OTHER): Payer: BLUE CROSS/BLUE SHIELD | Admitting: Physician Assistant

## 2017-02-26 VITALS — BP 126/73 | HR 74 | Temp 98.5°F | Resp 18 | Ht 61.69 in | Wt 110.0 lb

## 2017-02-26 DIAGNOSIS — E119 Type 2 diabetes mellitus without complications: Secondary | ICD-10-CM

## 2017-02-26 DIAGNOSIS — Z23 Encounter for immunization: Secondary | ICD-10-CM

## 2017-02-26 DIAGNOSIS — E785 Hyperlipidemia, unspecified: Secondary | ICD-10-CM | POA: Diagnosis not present

## 2017-02-26 NOTE — Progress Notes (Signed)
Lucky CowboySia Spoon  MRN: [id number] DOB: 1955/08/22  PCP: System, Provider Not In  Subjective:  Pt is a 61 year old female who presents to clinic for f/u elevated lipid levels. Her last OV lipid levels were >1,000 triglycerides. She started a Garlic supplement, gemfibrozil 600 mg twice daily 30 minutes before breakfast and dinner. Change statin therapy to Atorvastatin. Increased Metformin dose.  She eats fish, fruit and vegetables.  C/o dizziness after taking either atorvastatin or gemfibrozil. Lasts 20-30 minutes. Denies falls. Denies abdominal pain, nausea.  She is not fasting. She ate rice and beef about 2 hours ago.   Review of Systems  Constitutional: Negative for chills, diaphoresis, fatigue and fever.  Respiratory: Negative for cough, chest tightness, shortness of breath and wheezing.   Cardiovascular: Negative for chest pain and palpitations.  Gastrointestinal: Negative for abdominal pain, diarrhea, nausea and vomiting.  Neurological: Negative for weakness, light-headedness and headaches.    Patient Active Problem List   Diagnosis Date Noted  . Type 2 diabetes mellitus without complication, without long-term current use of insulin (HCC) 11/20/2015  . Hyperlipidemia 11/20/2015    Current Outpatient Prescriptions on File Prior to Visit  Medication Sig Dispense Refill  . atorvastatin (LIPITOR) 40 MG tablet Take 1 tablet (40 mg total) by mouth daily. 90 tablet 3  . Garlic 705 MG CAPS Take 1 capsule (705 mg total) by mouth daily. 60 capsule 4  . gemfibrozil (LOPID) 600 MG tablet Take 1 tablet (600 mg total) by mouth 2 (two) times daily before a meal. 60 tablet 4  . glimepiride (AMARYL) 4 MG tablet Take 1 tablet (4 mg total) by mouth daily. Take with breakfast or the first main meal. 60 tablet 4  . lisinopril (PRINIVIL,ZESTRIL) 10 MG tablet TAKE 1 TABLET(10 MG) BY MOUTH DAILY 60 tablet 6  . meloxicam (MOBIC) 7.5 MG tablet Take 1 tablet (7.5 mg total) by mouth daily. Max dose 15mg /day 60  tablet 0  . metFORMIN (GLUCOPHAGE) 500 MG tablet Take 500 mg by mouth 2 (two) times daily with a meal.    . Omega-3 Fatty Acids (FISH OIL) 1000 MG CAPS Take by mouth.    . traMADol-acetaminophen (ULTRACET) 37.5-325 MG tablet Take 1 tablet by mouth every 6 (six) hours as needed. 30 tablet 0   No current facility-administered medications on file prior to visit.     No Known Allergies   Objective:  BP 126/73 (BP Location: Left Arm, Patient Position: Sitting, Cuff Size: Normal)   Pulse 74   Temp 98.5 F (36.9 C) (Oral)   Resp 18   Ht 5' 1.69" (1.567 m)   Wt 110 lb (49.9 kg)   SpO2 97%   BMI 20.32 kg/m   Physical Exam  Constitutional: She is oriented to person, place, and time and well-developed, well-nourished, and in no distress. No distress.  Cardiovascular: Normal rate, regular rhythm and normal heart sounds.   Neurological: She is alert and oriented to person, place, and time. GCS score is 15.  Skin: Skin is warm and dry.  Psychiatric: Mood, memory, affect and judgment normal.  Vitals reviewed.   Lab Results  Component Value Date   CHOL 272 (H) 01/21/2017   HDL 28 (L) 01/21/2017   LDLCALC Comment 01/21/2017   TRIG 1,206 (HH) 01/21/2017   CHOLHDL 9.7 (H) 01/21/2017    Assessment and Plan :  1. Hyperlipidemia, unspecified hyperlipidemia type - Lipid panel; Future - She is not fasting today. Will put in future order. Will contact with  results and plan and make medication adjustments as needed.  2. Type 2 diabetes mellitus without complication, without long-term current use of insulin (HCC) - Hemoglobin A1c; Future  3. Need for immunization against influenza - Flu Vaccine QUAD 36+ mos IM   Marco Collie, PA-C  Primary Care at [facility] 02/26/2017 1:57 PM

## 2017-02-26 NOTE — Patient Instructions (Addendum)
Vui lng quay l?i trong vng m?t tu?n ?? lm vi?c trong phng th nghi?m FASTING. Khng ?n ho?c u?ng b?t c? th? g sau n?a ?m vo ?m tr??c khi th? nghi?m ho?c theo ch? d?n c?a nh cung c?p d?ch v? ch?m [place] s?c kh?e c?a b?n.  Please come back within a week for FASTING lab work. Do not eat or drink anything after midnight on the night before the test or as directed by your health care provider.   Thank you for coming in today. I hope you feel we met your needs. Feel free to call PCP if you have any questions or further requests. Please consider signing up for MyChart if you do not already have it, as this is a great way to communicate with me.  Best,  Whitney McVey, PA-C  Lipid Profile Test Why am I having this test? The lipid profile test gives results that can help predict the likelihood of developing heart disease. The test is also used to monitor treatment for high cholesterol to see if you are reaching your goals. A lipid profile measures the following:  Total cholesterol. Cholesterol is a waxy fat in your blood. If your total cholesterol is elevated, this can increase your risk of coronary heart disease.  High-density lipoprotein (HDL). This is known as the good cholesterol. Having a high level of HDL is good. Your HDL level may be low if you smoke or do not get enough exercise.  Low-density lipoprotein (LDL). This is known as the bad cholesterol and is responsible for the formation of plaque in the arteries. Having a low level of LDL is best.  Cholesterol to HDL ratio. This is calculated by dividing the total cholesterol by the HDL cholesterol. The ratio is used by health care providers for determining your risk of heart disease. A low ratio is best.  Triglycerides. These are a type of fat in the blood responsible for providing energy to your cells. Low levels are best.  What are the reference ranges? Reference ranges are considered healthy ranges established after testing a large  group of healthy people. Reference ranges may vary among different people, labs, and hospitals. It is your responsibility to obtain your test results. Ask the lab or department performing the test when and how you will get your results. Reference ranges for the lipid profile test are as follows: Total Cholesterol  Adult or elderly: less than 200 mg/dL or less than 5.20 mmol/L (SI units).  Child: 120-200 mg/dL.  Infant: 70-175 mg/dL.  Newborns: 53-135 mg/dL. HDL  Female: greater than 45 mg/dL or greater than 0.75 mmol/L (SI units).  Female: greater than 55 mg/dL or greater than 0.91 mmol/L (SI units). HDL reference values based on risk of heart disease:  For low risk of heart disease: ? Female: 60 mg/dL or 1.55 mmol/L. ? Female: 70 mg/dL or 1.81 mmol/L.  For moderate risk of heart disease: ? Female: 45 mg/dL or 1.17 mmol/L. ? Female: 55 mg/dL or 1.42 mmol/L.  For high risk of heart disease: ? Female: 25 mg/dL or 0.65 mmol/L. ? Female: 35 mg/dL or 0.90 mmol/L.  LDL  Adult: less than 130 mg/dL.  Children: less than 110 mg/dL. Cholesterol to HDL Ratio Reference values based on risk for coronary heart disease:  Risk that is one half average: ? Female: 3.4. ? Female: 3.3.  Average risk: ? Female: 5.0. ? Female: 4.4.  Risk that is two times average (moderate risk): ? Female: 10.0. ? Female: 7.0.  Risk that is three times average (high risk): ? Female: 24.0. ? Female: 11.0.  Triglycerides  Adult or elderly: ? Female: 40-160 mg/dL or 0.45-1.81 mmol/L (SI units). ? Female: 35-135 mg/dL or 0.40-1.52 mmol/L (SI units).  Children 50-75 years old: ? Female: 30-86 mg/dL. ? Female: 32-99 mg/dL.  Children 27-13 years old: ? Female: 31-108 mg/dL. ? Female: 35-114 mg/dL.  Children 50-63 years old: ? Female: 36-138 mg/dL. ? Female: 41-138 mg/dL.  Children 11-56 years old: ? Female: 40-163 mg/dL. ? Female: 40-128 mg/dL. Triglycerides should be less than 400 mg/dL even when you are not  fasting.  What do the results mean? Talk with your health care provider to discuss your results, treatment options, and if necessary, the need for more tests. Talk with your health care provider if you have any questions about your results. This information is not intended to replace advice given to you by your health care provider. Make sure you discuss any questions you have with your health care provider. Document Released: 07/11/2004 Document Revised: 02/21/2016 Document Reviewed: 10/07/2013 Elsevier Interactive Patient Education  2018 [employer].   IF you received an x-ray today, you will receive an invoice from [facility]. Please contact [facility] at [phone] with questions or concerns regarding your invoice.   IF you received labwork today, you will receive an invoice from [place]. Please contact [facility] at [phone] with questions or concerns regarding your invoice.   Our billing staff will not be able to assist you with questions regarding bills from these companies.  You will be contacted with the lab results as soon as they are available. The fastest way to get your results is to activate your My Chart account. Instructions are located on the last page of this paperwork. If you have not heard from [place] regarding the results in 2 weeks, please contact this office.    We recommend that you schedule a mammogram for breast cancer screening. Typically, you do not need a referral to do this. Please contact a local imaging center to schedule your mammogram.  [facility] - [phone]  *ask for the Radiology Department [facility] - [phone] or [phone]  [facility] - [phone] [phone] [facility] - [phone]  *ask for the [facility] - [phone]  *ask for the Radiology Department [facility] - [phone]  *ask for the [place] [facility] - [phone]

## 2017-03-04 ENCOUNTER — Ambulatory Visit: Payer: BLUE CROSS/BLUE SHIELD | Admitting: Physician Assistant

## 2017-03-04 DIAGNOSIS — Z1239 Encounter for other screening for malignant neoplasm of breast: Secondary | ICD-10-CM

## 2017-03-04 DIAGNOSIS — E785 Hyperlipidemia, unspecified: Secondary | ICD-10-CM

## 2017-03-04 DIAGNOSIS — E119 Type 2 diabetes mellitus without complications: Secondary | ICD-10-CM

## 2017-03-04 LAB — LIPID PANEL
Chol/HDL Ratio: 4.5 ratio — ABNORMAL HIGH (ref 0.0–4.4)
Cholesterol, Total: 172 mg/dL (ref 100–199)
HDL: 38 mg/dL — ABNORMAL LOW (ref >39)
LDL Calculated: 77 mg/dL (ref 0–99)
Triglycerides: 283 mg/dL — ABNORMAL HIGH (ref 0–149)
VLDL Cholesterol Cal: 57 mg/dL — ABNORMAL HIGH (ref 5–40)

## 2017-03-04 LAB — HEMOGLOBIN A1C
Est. average glucose Bld gHb Est-mCnc: 154 mg/dL
Hgb A1c MFr Bld: 7 % — ABNORMAL HIGH (ref 4.8–5.6)

## 2017-03-04 NOTE — Progress Notes (Signed)
Fast track labs only. I did not see this pt.

## 2017-03-07 ENCOUNTER — Other Ambulatory Visit: Payer: Self-pay | Admitting: Physician Assistant

## 2017-03-07 DIAGNOSIS — M25512 Pain in left shoulder: Principal | ICD-10-CM

## 2017-03-07 DIAGNOSIS — M25511 Pain in right shoulder: Secondary | ICD-10-CM

## 2017-03-08 ENCOUNTER — Encounter: Payer: Self-pay | Admitting: Physician Assistant

## 2017-03-08 NOTE — Progress Notes (Signed)
Labs are improving. Con't medications qd. RTC in 3 months for recheck.

## 2017-03-12 ENCOUNTER — Ambulatory Visit (INDEPENDENT_AMBULATORY_CARE_PROVIDER_SITE_OTHER): Payer: BLUE CROSS/BLUE SHIELD | Admitting: Orthopedic Surgery

## 2017-03-12 ENCOUNTER — Encounter (INDEPENDENT_AMBULATORY_CARE_PROVIDER_SITE_OTHER): Payer: Self-pay | Admitting: Orthopedic Surgery

## 2017-03-12 DIAGNOSIS — M25512 Pain in left shoulder: Secondary | ICD-10-CM

## 2017-03-12 DIAGNOSIS — M25562 Pain in left knee: Secondary | ICD-10-CM | POA: Diagnosis not present

## 2017-03-12 DIAGNOSIS — M25511 Pain in right shoulder: Secondary | ICD-10-CM

## 2017-03-12 MED ORDER — MELOXICAM 7.5 MG PO TABS: 7.5000 mg | ORAL_TABLET | Freq: Every day | ORAL | 0 refills | Status: DC

## 2017-03-12 NOTE — Progress Notes (Signed)
   Office Visit Note   Patient: Cathy Robbins           Date of Birth: [birth year]           MRN: [id number] Visit Date: 03/12/2017              Requested by: No referring provider defined for this encounter. PCP: System, Provider Not In  Chief Complaint  Patient presents with  . Left Knee - Follow-up  . Left Shoulder - Follow-up      HPI: Patient is a 61 year old woman with diabetes with arthritis of the left knee and left shoulder. She is status post an injection 1 month ago she states that this did help. Patient is also having some pain just below the popliteal fossa on the left knee. She states her leg can easily get very cold. She is seen with an interpreter. She states the tramadol gives her a headache.  Assessment & Plan: Visit Diagnoses:  1. Acute pain of left shoulder   2. Acute pain of left knee     Plan: Recommend that she discontinue the tramadol. Patient states the meloxicam has helped in the past and we will call in a prescription for this. Follow up as needed.  Follow-Up Instructions: Return if symptoms worsen or fail to improve.   Ortho Exam  Patient is alert, oriented, no adenopathy, well-dressed, normal affect, normal respiratory effort. Examination patient has a normal gait. She has no pain with range of motion of the shoulder or knee. There is no effusion. She has no tenderness to palpation in the calf no clinical signs of DVT. There is no effusion of the knee. There is no redness no cellulitis.  Imaging: No results found. No images are attached to the encounter.  Labs: Lab Results  Component Value Date   HGBA1C 7.0 (H) 03/04/2017   HGBA1C 8.2 (H) 01/21/2017   HGBA1C 9.6 11/20/2015    Orders:  No orders of the defined types were placed in this encounter.  No orders of the defined types were placed in this encounter.    Procedures: No procedures performed  Clinical Data: No additional findings.  ROS:  All other systems negative, except as noted  in the HPI. Review of Systems  Objective: Vital Signs: There were no vitals taken for this visit.  Specialty Comments:  No specialty comments available.  PMFS History: Patient Active Problem List   Diagnosis Date Noted  . Type 2 diabetes mellitus without complication, without long-term current use of insulin (HCC) 11/20/2015  . Hyperlipidemia 11/20/2015   Past Medical History:  Diagnosis Date  . Diabetes mellitus without complication (HCC)     History reviewed. No pertinent family history.  History reviewed. No pertinent surgical history. Social History   Occupational History  . Not on file.   Social History Main Topics  . Smoking status: Never Smoker  . Smokeless tobacco: Never Used  . Alcohol use No  . Drug use: No  . Sexual activity: Not on file

## 2017-04-16 ENCOUNTER — Ambulatory Visit (INDEPENDENT_AMBULATORY_CARE_PROVIDER_SITE_OTHER): Payer: BLUE CROSS/BLUE SHIELD

## 2017-04-16 ENCOUNTER — Ambulatory Visit (INDEPENDENT_AMBULATORY_CARE_PROVIDER_SITE_OTHER): Payer: BLUE CROSS/BLUE SHIELD | Admitting: Family

## 2017-04-16 ENCOUNTER — Ambulatory Visit (INDEPENDENT_AMBULATORY_CARE_PROVIDER_SITE_OTHER): Payer: BLUE CROSS/BLUE SHIELD | Admitting: Orthopedic Surgery

## 2017-04-16 DIAGNOSIS — M79662 Pain in left lower leg: Secondary | ICD-10-CM | POA: Diagnosis not present

## 2017-04-16 DIAGNOSIS — M545 Low back pain: Secondary | ICD-10-CM | POA: Diagnosis not present

## 2017-04-16 DIAGNOSIS — R208 Other disturbances of skin sensation: Secondary | ICD-10-CM | POA: Diagnosis not present

## 2017-04-16 DIAGNOSIS — R200 Anesthesia of skin: Secondary | ICD-10-CM

## 2017-04-16 DIAGNOSIS — G8929 Other chronic pain: Secondary | ICD-10-CM | POA: Diagnosis not present

## 2017-04-16 MED ORDER — PREDNISONE 50 MG PO TABS: ORAL_TABLET | ORAL | 0 refills | Status: DC

## 2017-04-16 NOTE — Progress Notes (Signed)
Office Visit Note   Patient: Cathy Robbins           Date of Birth: [birth year]           MRN: [id number] Visit Date: 04/16/2017              Requested by: No referring provider defined for this encounter. PCP: System, Provider Not In  No chief complaint on file.     HPI: The patient is a 61 year old [place]et[place] woman seen for ongoing left calf pain with the assistance of an interpreter. This is constant. No aggravating factors. Might feel better occasionally when her husband massages it. Does relate feels better with extension of her knee. Has been exercising on some machines at work, feels the more active she is the better the calf feels.   Did have an injection about 2 months ago for OA left knee, states this improved her calf pain some. Has worn off now. Is taking tramadol, tylenol and meloxicam without relief.   Some chronic low back pain and anterior groin. Does endorse tingling/numbness in her left foot. Heaviness to left foot. No thigh pain.   Assessment & Plan: Visit Diagnoses:  1. Numbness of left foot   2. Pain of left calf   3. Chronic low back pain, unspecified back pain laterality, with sciatica presence unspecified     Plan: radiographs of lumbar spine unremarkable. Question lumbar radiculopathy. Doubt blood clot. Will trial oral Prednisone.   Follow-Up Instructions: Return in about 4 weeks (around 05/14/2017).   Back Exam   Tenderness  The patient is experiencing no tenderness.   Range of Motion  The patient has normal back ROM.  Muscle Strength  The patient has normal back strength.  Other  Gait: normal       Patient is alert, oriented, no adenopathy, well-dressed, normal affect, normal respiratory effort. Examination patient has a normal gait. She has no pain with range of motion of the knee. There is no effusion.  There is no effusion of the knee. There is no redness no cellulitis. She has no tenderness to palpation in the calf, no clinical signs of  DVT.  Imaging: Xr Lumbar Spine 2-3 Views  Result Date: 04/16/2017 Radiographs of lumbar spine show some mild spondylosis of L1-L2. No spondylolisthesis.   No images are attached to the encounter.  Labs: Lab Results  Component Value Date   HGBA1C 7.0 (H) 03/04/2017   HGBA1C 8.2 (H) 01/21/2017   HGBA1C 9.6 11/20/2015    Orders:  Orders Placed This Encounter  Procedures  . XR Lumbar Spine 2-3 Views   No orders of the defined types were placed in this encounter.    Procedures: No procedures performed  Clinical Data: No additional findings.  ROS:  All other systems negative, except as noted in the HPI. Review of Systems  Constitutional: Negative for chills and fever.  Cardiovascular: Negative for leg swelling.  Musculoskeletal: Positive for back pain and myalgias. Negative for gait problem and joint swelling.  Skin: Negative for color change.  Neurological: Positive for numbness. Negative for weakness.    Objective: Vital Signs: There were no vitals taken for this visit.  Specialty Comments:  No specialty comments available.  PMFS History: Patient Active Problem List   Diagnosis Date Noted  . Type 2 diabetes mellitus without complication, without long-term current use of insulin (HCC) 11/20/2015  . Hyperlipidemia 11/20/2015   Past Medical History:  Diagnosis Date  . Diabetes mellitus without complication (HCC)  No family history on file.  No past surgical history on file. Social History   Occupational History  . Not on file.   Social History Main Topics  . Smoking status: Never Smoker  . Smokeless tobacco: Never Used  . Alcohol use No  . Drug use: No  . Sexual activity: Not on file

## 2017-04-23 ENCOUNTER — Encounter: Payer: Self-pay | Admitting: Physician Assistant

## 2017-04-23 ENCOUNTER — Ambulatory Visit (INDEPENDENT_AMBULATORY_CARE_PROVIDER_SITE_OTHER): Payer: BLUE CROSS/BLUE SHIELD | Admitting: Physician Assistant

## 2017-04-23 ENCOUNTER — Encounter: Payer: Self-pay | Admitting: Internal Medicine

## 2017-04-23 VITALS — BP 122/70 | HR 66 | Temp 98.3°F | Resp 16 | Ht 61.0 in | Wt 112.0 lb

## 2017-04-23 DIAGNOSIS — E785 Hyperlipidemia, unspecified: Secondary | ICD-10-CM

## 2017-04-23 DIAGNOSIS — M79662 Pain in left lower leg: Secondary | ICD-10-CM

## 2017-04-23 DIAGNOSIS — Z1239 Encounter for other screening for malignant neoplasm of breast: Secondary | ICD-10-CM

## 2017-04-23 DIAGNOSIS — R5383 Other fatigue: Secondary | ICD-10-CM

## 2017-04-23 DIAGNOSIS — E119 Type 2 diabetes mellitus without complications: Secondary | ICD-10-CM

## 2017-04-23 DIAGNOSIS — Z1211 Encounter for screening for malignant neoplasm of colon: Secondary | ICD-10-CM

## 2017-04-23 DIAGNOSIS — Z1231 Encounter for screening mammogram for malignant neoplasm of breast: Secondary | ICD-10-CM

## 2017-04-23 LAB — POCT CBC
Granulocyte percent: 64 % (ref 37–80)
HCT, POC: 35.3 % — AB (ref 37.7–47.9)
Hemoglobin: 11.2 g/dL — AB (ref 12.2–16.2)
Lymph, poc: 2.8 (ref 0.6–3.4)
MCH, POC: 23.7 pg — AB (ref 27–31.2)
MCHC: 31.7 g/dL — AB (ref 31.8–35.4)
MCV: 74.7 fL — AB (ref 80–97)
MID (cbc): 0.2 (ref 0–0.9)
MPV: 7.2 fL (ref 0–99.8)
POC Granulocyte: 5.4 (ref 2–6.9)
POC LYMPH PERCENT: 33.8 % (ref 10–50)
POC MID %: 2.2 % (ref 0–12)
Platelet Count, POC: 315 10*3/uL (ref 142–424)
RBC: 4.73 M/uL (ref 4.04–5.48)
RDW, POC: 13.2 %
WBC: 8.4 10*3/uL (ref 4.6–10.2)

## 2017-04-23 LAB — POCT GLYCOSYLATED HEMOGLOBIN (HGB A1C): Hemoglobin A1C: 7.1

## 2017-04-23 MED ORDER — TROLAMINE SALICYLATE 10 % EX CREA: 1.0000 "application " | TOPICAL_CREAM | CUTANEOUS | 0 refills | Status: DC | PRN

## 2017-04-23 NOTE — Patient Instructions (Addendum)
Aspercream is for your leg pain.  Continue checking your blood sugars a few times a week.  Come back and see me in 6 months. I will refill your medications as needed.  You will receive a phone call to schedule an appointment for an eye exam, mammogram and a colonoscopy.    N?i soi ??i trng, Ng??i l?n Colonoscopy, Adult N?i soi ??i trng l th?m khm ?? ki?m tra ton b? ru?t gi. Trong qu trnh th?m khm, m?t ?ng ???c bi tr?n, c th? u?n cong ???c ??a vo trong h?u mn v sau ? vo tr?c trng, ??i trng v cc ph?n khc c?a ru?t gi. N?i soi ??i trng th??ng ???c th?c hi?n nh? m?t ph?n c?a khm sng l?c ??i-tr?c k?t trng thng th??ng ho?c ?? ?ng ph v?i m?t s? tri?u ch?ng nh?t ??nh, ch?ng h?n nh? b?nh thi?u mu, tiu ch?y dai d?ng, ?au b?ng v mu trong phn. Th?m khm ny c th? gip sng l?c v ch?n ?on cc v?n ?? b?nh l, bao g?m:  Kh?i u.  Polip.  Vim.  Cc vng ch?y mu.  Hy cho chuyn gia ch?m [place] s?c kh?e bi?t v?:  B?t k? v?n ?? d? ?ng no m qu v? c.  T?t c? cc lo?i thu?c m qu v? ?ang s? d?ng, bao g?m c? vitamin, th?o d??c, thu?c nh? m?t, thu?c d?ng kem, thu?c khng k ??n.  B?t k? v?n ?? g m qu v? ho?c cc thnh vin trong gia ?nh ? g?p ph?i v?i thu?c gy m.  B?t k? r?i lo?n v? mu no m qu v? c.  B?t k? ph?u thu?t no qu v? ? ???c lm.  B?t k? tnh tr?ng b?nh l no c?a qu v?.  B?t k? v?n ?? no m qu v? ? b? trong khi ??i ti?n. Cc nguy c? l g? Ni chung, ?y l m?t th? thu?t an ton. Tuy nhin, cc v?n ?? c th? x?y ra, bao g?m:  Ch?y mu.  V?t rch trong ru?t.  Ph?n ?ng v?i thu?c ???c cho s? d?ng trong lc th?m khm.  Nhi?m trng (hi?m g?p).  ?i?u g x?y ra tr??c khi lm th? thu?t? Nh?ng h?n ch? v? ?n v u?ng Tun th? ch? d?n c?a chuyn gia ch?m [place] s?c kh?e v? ?n v u?ng, c th? bao g?m:  M?t vi ngy tr??c khi ti?n hnh th? thu?t - tun theo ch? ?? ?n t ch?t x?. Trnh ?n qu? h?ch, cc lo?i h?t, tri cy s?y, tri cy s?ng  v rau.  1-3 ngy tr??c ngy ti?n hnh th? thu?t - tun theo ch? ?? ?n ?? l?ng trong. Ch? u?ng cc ?? l?ng trong, ch?ng h?n nh? canh ho?c n??c canh th?t trong, tr ho?c c ph ?en, n??c p trong, n??c ng?t ho?c n??c u?ng th? thao trong, mn trng mi?ng ch?a gelatin v kem que. Trnh u?ng cc ch?t l?ng c ch?a ph?m mu ?? ho?c tm.  Vo ngy ti?n hnh th? thu?t - khng ?n hay u?ng b?t k? th? g trong vng 2 gi? tr??c khi ti?n hnh th? thu?t, ho?c trong kho?ng th?i gian m chuyn gia ch?m [place] s?c kh?e c?a qu v? ch? d?n.  Lm s?ch ru?t N?u qu v? ? ???c k ??n m?t lo?i thu?c x? qua ???ng u?ng ?? lm s?ch ??i trng:  Hy s?? du?ng theo ch? d?n c?a chuyn gia ch?m [place] s?c kh?e c?a qu v?. B?t ??u vo ngy tr??c khi lm th? thu?t, qu v? s? c?n u?ng  m?t l??ng l?n d?ch l?ng pha thu?c. Ch?t l?ng ny s? lm qu v? ??i ti?n phn l?ng nhi?u l?n cho ??n khi phn g?n nh? trong ho?c c mu xanh l cy nh?t.  N?u da ho?c h?u mn c?a qu v? b? kch ?ng do tiu ch?y, qu v? c th? s? d?ng nh?ng th? sau ?? lm gi?m kch ?ng: ? Kh?n lau t?m thu?c, ch?ng h?n nh? kh?n lau ??t c?a ng??i l?n c tinh ch?t l h?i v vitamin E. ? S?n ph?m lm d?u da nh? vaseline.  N?u qu v? b? nn trong khi u?ng thu?c x?, hy ngh? ng?i trong t?i ?a 60 pht v sau ? b?t ??u l?i vi?c lm s?ch ru?t. N?u qu v? ti?p t?c nn v khng th? u?ng thu?c x? m khng b? nn, hy g?i cho chuyn gia ch?m [place] s?c kh?e c?a qu v?.  H??ng d?n chung  Hy h?i chuyn gia ch?m [place] s?c kh?e v? vi?c thay ??i ho?c d?ng cc lo?i thu?c dng th??ng xuyn c?a qu v?. ?i?u ny ??c bi?t quan tr?ng n?u qu v? ?ang dng thu?c tr? ti?u ???ng ho?c thu?c lm long mu.  C k? ho?ch nh? ai ? ??a quy? vi? t? b?nh vi?n ho?c t? phng khm v? nh. ?i?u g x?y ra trong qu trnh th?c hi?n th? thu?t?  Qu v? c th? ???c ??t m?t ???ng truy?n t?nh m?ch (IV) vo m?t trong cc t?nh m?ch.  Qu v? s? ???c cho dng thu?c ?? gip th? gin (thu?c an th?n).  ?? gi?m nguy c?  nhi?m trng: ? ??i ng? nhn vin y t? s? r?a ho?c st trng tay c?a h?. ? Vng h?u mn c?a qu v? s? ???c r?a b?ng x phng.  Qu v? s? ???c yu c?u n?m nghing, hai ??u g?i g?p l?i.  Chuyn gia ch?m [place] s?c kh?e c?a qu v? s? bi tr?n m?t ?ng di, m?ng, m?m. ?ng s? ???c g?n camera v ?n ? ??u.  ?ng s? ???c ??a vo h?u mn c?a qu v?.  ?ng s? ???c nh? nhng ??a qua tr?c trng v ??i trng c?a qu v?.  Khng kh s? ???c b?m vo ??i trng c?a qu v? ?? gi? cho ??i trng m? r?ng. Qu v? c th? c?m th?y m?t cht p l?c ho?c co th?t.  Camera s? ???c s? d?ng ?? ch?p ?nh trong qu trnh ti?n hnh th? thu?t.  M?t m?u m nh? co? th? ????c l?y t?? c? th? c?a qu v? ?? ki?m tra d???i ki?nh hi?n vi (sinh thi?t). N?u pht hi?n th?y b?t k? v?n ?? ti?m tng no, m ny s? ???c g?i ??n phng th nghi?m ?? xt nghi?m.  N?u pht hi?n th?y cc polip nh?, chuyn gia ch?m [place] s?c kh?e c?a qu v? c th? l?y cc polip ? v mang ?i ki?m tra ?? xem c t? bo ung th? khng.  ?ng ??a vo h?u mn c?a qu v? s? ???c t? t? rt ra. Th? thu?t ny c th? khc nhau gi?a cc chuyn gia ch?m [place] s?c kh?e v cc b?nh vi?n. ?i?u g x?y ra sau khi lm th? thu?t?  Huy?t p, nh?p tim, nh?p th? v n?ng ?? oxi trong mu c?a qu v? s? ???c theo di cho ??n khi thu?c qu v? ? dng h?t tc d?ng.  Khng li xe trong vng 24 gi? sau khi th?m khm.  Qu v? c th? c m?t l??ng mu nh? trong phn.  Qu v? c th?  trung ti?n v b? co th?t ho?c ch??ng b?ng nh? do khng kh ? ???c s? d?ng ?? lm ph?ng ??i trng c?a qu v? trong lc th?m khm.  Qu v? ???c ty  l?y k?t qu? th? thu?t c?a mnh. Hy h?i chuyn gia ch?m [place] s?c kh?e ho?c khoa th?c hi?n thu? thu?t ?? bi?t khi no c k?t qu? c?a qu v?. Thng tin ny khng nh?m m?c ?ch thay th? cho l?i khuyn m chuyn gia ch?m [place] s?c kh?e ni v?i qu v?. Hy b?o ??m qu v? ph?i th?o lu?n b?t k? v?n ?? g m qu v? c v?i chuyn gia ch?m [place] s?c kh?e c?a qu v?. Document  Released: 03/27/2005 Document Revised: 05/30/2016 Document Reviewed: 08/29/2015 Elsevier Interactive Patient Education  2018 [employer].   IF you received an x-ray today, you will receive an invoice from [facility] Radiology. Please contact [facility] Radiology at [phone] with questions or concerns regarding your invoice.   IF you received labwork today, you will receive an invoice from [place]. Please contact [facility] at [phone] with questions or concerns regarding your invoice.   Our billing staff will not be able to assist you with questions regarding bills from these companies.  You will be contacted with the lab results as soon as they are available. The fastest way to get your results is to activate your My Chart account. Instructions are located on the last page of this paperwork. If you have not heard from [place] regarding the results in 2 weeks, please contact this office.

## 2017-04-23 NOTE — Progress Notes (Signed)
Cathy Robbins  MRN: [id number] DOB: 03-11-1956  PCP: System, Provider Not In  Subjective:  Pt is a 61 year old female who presents to clinic for DM check. She is here today with her husband.     DM x 2 years- Glimepiride 47m and Metformin 500 mg bid. Reports compliance. She is taking blood sugars at home occasionally: 136 and 106. Denies polyuria, polydipsia, polyphagia, n/t of extremities, vision changes.  HTN - Lisinopril 173mqd. Reports compliance. Blood pressure today is 122/70.  Denies headache, chest pain, palpitations, leg swelling.   HLD - Garlic supplement 70025g, gemfibrozil 600 mg twice daily, Atorvastatin 405mShe is not fasting today.   Review of Systems  Constitutional: Positive for fatigue. Negative for chills, diaphoresis and fever.  HENT: Negative for congestion, postnasal drip, rhinorrhea, sinus pressure, sneezing and sore throat.   Respiratory: Negative for cough, chest tightness, shortness of breath and wheezing.   Cardiovascular: Negative for chest pain and palpitations.  Gastrointestinal: Negative for abdominal pain, diarrhea, nausea and vomiting.  Neurological: Negative for weakness, light-headedness and headaches.    Patient Active Problem List   Diagnosis Date Noted  . Type 2 diabetes mellitus without complication, without long-term current use of insulin ([place]CC[place]5/22/2017  . Hyperlipidemia 11/20/2015    Current Outpatient Prescriptions on File Prior to Visit  Medication Sig Dispense Refill  . atorvastatin (LIPITOR) 40 MG tablet Take 1 tablet (40 mg total) by mouth daily. 90 tablet 3  . Garlic 705427 CAPS Take 1 capsule (705 mg total) by mouth daily. 60 capsule 4  . gemfibrozil (LOPID) 600 MG tablet Take 1 tablet (600 mg total) by mouth 2 (two) times daily before a meal. 60 tablet 4  . glimepiride (AMARYL) 4 MG tablet Take 1 tablet (4 mg total) by mouth daily. Take with breakfast or the first main meal. 60 tablet 4  . lisinopril (PRINIVIL,ZESTRIL) 10 MG tablet  TAKE 1 TABLET(10 MG) BY MOUTH DAILY 60 tablet 6  . meloxicam (MOBIC) 7.5 MG tablet Take 1 tablet (7.5 mg total) by mouth daily. Max dose 73m59my 60 tablet 0  . metFORMIN (GLUCOPHAGE) 500 MG tablet Take 500 mg by mouth 2 (two) times daily with a meal.    . Omega-3 Fatty Acids (FISH OIL) 1000 MG CAPS Take by mouth.    . traMADol-acetaminophen (ULTRACET) 37.5-325 MG tablet Take 1 tablet by mouth every 6 (six) hours as needed. 30 tablet 0   No current facility-administered medications on file prior to visit.     No Known Allergies   Objective:  BP 122/70   Pulse 66   Temp 98.3 F (36.8 C) (Oral)   Resp 16   Ht '5\' 1"'  (1.549 m)   Wt 112 lb (50.8 kg)   SpO2 99%   BMI 21.16 kg/m   Physical Exam  Constitutional: She is oriented to person, place, and time and well-developed, well-nourished, and in no distress. No distress.  Appears slightly paler than she normally does.   Cardiovascular: Normal rate, regular rhythm and normal heart sounds.   Abdominal: Soft. Bowel sounds are normal. She exhibits no mass. There is no tenderness.  Neurological: She is alert and oriented to person, place, and time. GCS score is 15.  Skin: Skin is warm and dry.  Psychiatric: Mood, memory, affect and judgment normal.  Vitals reviewed.  Results for orders placed or performed in visit on 04/23/17  POCT glycosylated hemoglobin (Hb A1C)  Result Value Ref Range   Hemoglobin  A1C 7.1   POCT CBC  Result Value Ref Range   WBC 8.4 4.6 - 10.2 K/uL   Lymph, poc 2.8 0.6 - 3.4   POC LYMPH PERCENT 33.8 10 - 50 %L   MID (cbc) 0.2 0 - 0.9   POC MID % 2.2 0 - 12 %M   POC Granulocyte 5.4 2 - 6.9   Granulocyte percent 64.0 37 - 80 %G   RBC 4.73 4.04 - 5.48 M/uL   Hemoglobin 11.2 (A) 12.2 - 16.2 g/dL   HCT, POC 35.3 (A) 37.7 - 47.9 %   MCV 74.7 (A) 80 - 97 fL   MCH, POC 23.7 (A) 27 - 31.2 pg   MCHC 31.7 (A) 31.8 - 35.4 g/dL   RDW, POC 13.2 %   Platelet Count, POC 315 142 - 424 K/uL   MPV 7.2 0 - 99.8 fL     Assessment and Plan :  1. Type 2 diabetes mellitus without complication, without long-term current use of insulin (HCC) - POCT glycosylated hemoglobin (Hb A1C) - CMP14+EGFR - Ambulatory referral to Ophthalmology - Plan to refer for routine DM eye exam. Hemoglobin A1C is 7.1%. Encouraged con't healthy diet and medication compliance.  2. Hyperlipidemia, unspecified hyperlipidemia type - Lipid panel  3. Fatigue, unspecified type - POCT CBC - Labs are unchanged from her prior results.  4. Pain of left calf - trolamine salicylate (ASPERCREME/ALOE) 10 % cream; Apply 1 application topically as needed for muscle pain.  Dispense: 85 g; Refill: 0  5. Screening for breast cancer - MM Digital Screening; Future  6. Screen for colon cancer - Ambulatory referral to Gastroenterology   Mercer Pod, PA-C  [facility] [place] 04/23/2017 10:25 AM

## 2017-04-24 ENCOUNTER — Encounter: Payer: Self-pay | Admitting: Physician Assistant

## 2017-04-24 ENCOUNTER — Other Ambulatory Visit: Payer: Self-pay | Admitting: Physician Assistant

## 2017-04-24 DIAGNOSIS — E785 Hyperlipidemia, unspecified: Secondary | ICD-10-CM

## 2017-04-24 LAB — CMP14+EGFR
ALT: 15 IU/L (ref 0–32)
AST: 13 IU/L (ref 0–40)
Albumin/Globulin Ratio: 1.3 (ref 1.2–2.2)
Albumin: 4.3 g/dL (ref 3.6–4.8)
Alkaline Phosphatase: 85 IU/L (ref 39–117)
BUN/Creatinine Ratio: 17 (ref 12–28)
BUN: 12 mg/dL (ref 8–27)
Bilirubin Total: 0.5 mg/dL (ref 0.0–1.2)
CO2: 23 mmol/L (ref 20–29)
Calcium: 9.3 mg/dL (ref 8.7–10.3)
Chloride: 102 mmol/L (ref 96–106)
Creatinine, Ser: 0.72 mg/dL (ref 0.57–1.00)
GFR calc Af Amer: 103 mL/min/{1.73_m2} (ref >59)
GFR calc non Af Amer: 89 mL/min/{1.73_m2} (ref >59)
Globulin, Total: 3.2 g/dL (ref 1.5–4.5)
Glucose: 248 mg/dL — ABNORMAL HIGH (ref 65–99)
Potassium: 4.4 mmol/L (ref 3.5–5.2)
Sodium: 140 mmol/L (ref 134–144)
Total Protein: 7.5 g/dL (ref 6.0–8.5)

## 2017-04-24 LAB — LIPID PANEL
Chol/HDL Ratio: 5.1 ratio — ABNORMAL HIGH (ref 0.0–4.4)
Cholesterol, Total: 163 mg/dL (ref 100–199)
HDL: 32 mg/dL — ABNORMAL LOW (ref >39)
Triglycerides: 407 mg/dL — ABNORMAL HIGH (ref 0–149)

## 2017-04-24 NOTE — Progress Notes (Signed)
Please call pt and have them come in for a LAB ONLY visit at their earliest convenience. Thank you!

## 2017-05-02 ENCOUNTER — Other Ambulatory Visit (INDEPENDENT_AMBULATORY_CARE_PROVIDER_SITE_OTHER): Payer: Self-pay | Admitting: Orthopedic Surgery

## 2017-05-02 DIAGNOSIS — M25511 Pain in right shoulder: Secondary | ICD-10-CM

## 2017-05-02 DIAGNOSIS — M25512 Pain in left shoulder: Principal | ICD-10-CM

## 2017-05-14 ENCOUNTER — Ambulatory Visit (INDEPENDENT_AMBULATORY_CARE_PROVIDER_SITE_OTHER): Payer: BLUE CROSS/BLUE SHIELD | Admitting: Family

## 2017-05-14 ENCOUNTER — Telehealth: Payer: Self-pay | Admitting: Physician Assistant

## 2017-05-14 ENCOUNTER — Encounter (INDEPENDENT_AMBULATORY_CARE_PROVIDER_SITE_OTHER): Payer: Self-pay

## 2017-05-14 NOTE — Telephone Encounter (Signed)
Copied from CRM [phone]. Topic: Quick Communication - See Telephone Encounter >> May 14, 2017  4:27 PM Terisa Starraylor, Brittany L wrote: CRM for notification. See Telephone encounter for:   Patients daughter called and said she received a letter in the mail saying she needed to call and schedule labs for her cholesterol and her sugar. I do not see lab orders for that, could someone please call her and get this schedule, thanks. Please call the daughter at [phone]   05/14/17.

## 2017-05-15 NOTE — Telephone Encounter (Signed)
LMOVM on dtr's cell - pt to be fasting for repeat Lipid labs  Explained fasting in msg, Advised to come in no earlier than 8:15am for labs.

## 2017-05-28 ENCOUNTER — Encounter: Payer: Self-pay | Admitting: Internal Medicine

## 2017-07-03 ENCOUNTER — Encounter: Payer: Self-pay | Admitting: Physician Assistant

## 2017-07-16 ENCOUNTER — Encounter: Payer: Self-pay | Admitting: Internal Medicine

## 2017-07-23 ENCOUNTER — Ambulatory Visit: Payer: BLUE CROSS/BLUE SHIELD | Admitting: Physician Assistant

## 2017-07-23 ENCOUNTER — Other Ambulatory Visit: Payer: Self-pay

## 2017-07-23 VITALS — BP 110/60 | HR 88 | Temp 98.3°F | Ht 61.0 in | Wt 108.0 lb

## 2017-07-23 DIAGNOSIS — R197 Diarrhea, unspecified: Secondary | ICD-10-CM

## 2017-07-23 NOTE — Patient Instructions (Signed)
Please purchase some over-the-counter Imodium.  You can take 2 tabs initially, and then 2 tabs every 4-6 hours as needed.  If at any point you develop abdominal pain that seems to persist please come back to the clinic.  If you cannot hold down fluids then please come back to the clinic.  If you have blood in your stool then please come back to the clinic.

## 2017-07-23 NOTE — Progress Notes (Signed)
07/23/2017 10:24 AM   DOB: [birth year] / MRN: [id number]  SUBJECTIVE:  Cathy Robbins is a 62 y.o. female presenting for diarrhea.  This started last night she denies focal abdominal pain, bloody diarrhea, nausea.  No recent travel.  She complains of some mild dizziness.  No new medications.  Patient's husband is here and also requesting refill of meloxicam.  She has a history of well-controlled diabetes.  She has No Known Allergies.   She  has a past medical history of Diabetes mellitus without complication (HCC).    She  reports that  has never smoked. she has never used smokeless tobacco. She reports that she does not drink alcohol or use drugs. She  has no sexual activity history on file. The patient  has no past surgical history on file.  Her family history is not on file.  Review of Systems  Constitutional: Negative for chills, diaphoresis and fever.  Eyes: Negative.   Respiratory: Negative for cough, hemoptysis, sputum production, shortness of breath and wheezing.   Cardiovascular: Negative for chest pain, orthopnea and leg swelling.  Gastrointestinal: Positive for diarrhea. Negative for abdominal pain, blood in stool, constipation, heartburn, melena, nausea and vomiting.  Genitourinary: Negative for flank pain.  Skin: Negative for rash.  Neurological: Negative for dizziness, sensory change, speech change, focal weakness and headaches.    The problem list and medications were reviewed and updated by myself where necessary and exist elsewhere in the encounter.   OBJECTIVE:  BP 110/60 (BP Location: Left Arm, Patient Position: Sitting, Cuff Size: Large)   Pulse 88   Temp 98.3 F (36.8 C) (Oral)   Ht 5\' 1"  (1.549 m)   Wt 108 lb (49 kg)   SpO2 99%   BMI 20.41 kg/m   Physical Exam  Constitutional: She is active.  Non-toxic appearance.  Cardiovascular: Normal rate, regular rhythm, S1 normal, S2 normal, normal heart sounds and intact distal pulses. Exam reveals no gallop, no  friction rub and no decreased pulses.  No murmur heard. Pulmonary/Chest: Effort normal. No stridor. No tachypnea. No respiratory distress. She has no wheezes. She has no rales.  Abdominal: She exhibits no distension and no mass. Bowel sounds are increased. There is no tenderness. There is no rigidity, no rebound, no guarding, no CVA tenderness, no tenderness at McBurney's point and negative Murphy's sign.  Musculoskeletal: She exhibits no edema.  Neurological: She is alert.  Skin: Skin is warm and dry. She is not diaphoretic. No pallor.    Orthostatic VS for the past 24 hrs:  BP- Lying Pulse- Lying BP- Sitting Pulse- Sitting BP- Standing at 0 minutes Pulse- Standing at 0 minutes  07/23/17 0957 141/76 83 137/79 85 125/77 87   Lab Results  Component Value Date   HGBA1C 7.1 04/23/2017       No results found for this or any previous visit (from the past 72 hour(s)).  No results found.  ASSESSMENT AND PLAN:  Cathy Robbins was seen today for diarrhea, emesis, dizziness and medication refill.  Diagnoses and all orders for this visit:  Diarrhea of presumed infectious origin: Nonfocal exam.  Orthostatics within normal limits.  No tachycardia.  She looks ill but nontoxic.  This is likely either a toxin ingestion or more likely a self-limiting virus.  She will come back in about 48 hours if she is not feeling better and we could consider getting a stool PCR at that time.  Diabetes historically well controlled.  Advised Imodium, fluids, rest, and a  bland diet.    The patient is advised to call or return to clinic if she does not see an improvement in symptoms, or to seek the care of the closest emergency department if she worsens with the above plan.   Deliah BostonMichael Yanci Bachtell, MHS, PA-C [facility] 07/23/2017 10:24 AM

## 2017-10-22 ENCOUNTER — Ambulatory Visit: Payer: BLUE CROSS/BLUE SHIELD | Admitting: Physician Assistant

## 2017-12-16 ENCOUNTER — Other Ambulatory Visit: Payer: Self-pay

## 2017-12-16 ENCOUNTER — Encounter: Payer: Self-pay | Admitting: Physician Assistant

## 2017-12-16 ENCOUNTER — Ambulatory Visit: Payer: BLUE CROSS/BLUE SHIELD | Admitting: Physician Assistant

## 2017-12-16 VITALS — BP 124/77 | HR 79 | Temp 98.0°F | Ht 61.0 in | Wt 110.8 lb

## 2017-12-16 DIAGNOSIS — R739 Hyperglycemia, unspecified: Secondary | ICD-10-CM

## 2017-12-16 DIAGNOSIS — E119 Type 2 diabetes mellitus without complications: Secondary | ICD-10-CM | POA: Diagnosis not present

## 2017-12-16 DIAGNOSIS — R42 Dizziness and giddiness: Secondary | ICD-10-CM

## 2017-12-16 DIAGNOSIS — Z130 Encounter for screening for diseases of the blood and blood-forming organs and certain disorders involving the immune mechanism: Secondary | ICD-10-CM

## 2017-12-16 LAB — CMP14+EGFR
ALT: 13 IU/L (ref 0–32)
AST: 18 IU/L (ref 0–40)
Albumin/Globulin Ratio: 1.3 (ref 1.2–2.2)
Albumin: 4.5 g/dL (ref 3.6–4.8)
Alkaline Phosphatase: 90 IU/L (ref 39–117)
BUN/Creatinine Ratio: 19 (ref 12–28)
BUN: 16 mg/dL (ref 8–27)
Bilirubin Total: 0.6 mg/dL (ref 0.0–1.2)
CO2: 22 mmol/L (ref 20–29)
Calcium: 10 mg/dL (ref 8.7–10.3)
Chloride: 99 mmol/L (ref 96–106)
Creatinine, Ser: 0.83 mg/dL (ref 0.57–1.00)
GFR calc Af Amer: 87 mL/min/{1.73_m2} (ref >59)
GFR calc non Af Amer: 75 mL/min/{1.73_m2} (ref >59)
Globulin, Total: 3.4 g/dL (ref 1.5–4.5)
Glucose: 251 mg/dL — ABNORMAL HIGH (ref 65–99)
Potassium: 4.9 mmol/L (ref 3.5–5.2)
Sodium: 137 mmol/L (ref 134–144)
Total Protein: 7.9 g/dL (ref 6.0–8.5)

## 2017-12-16 LAB — POCT URINALYSIS DIP (MANUAL ENTRY)
Bilirubin, UA: NEGATIVE
Blood, UA: NEGATIVE
Glucose, UA: 250 mg/dL — AB
Ketones, POC UA: NEGATIVE mg/dL
Nitrite, UA: NEGATIVE
Protein Ur, POC: 30 mg/dL — AB
Spec Grav, UA: 1.025 (ref 1.010–1.025)
Urobilinogen, UA: 1 E.U./dL
pH, UA: 6 (ref 5.0–8.0)

## 2017-12-16 LAB — POCT CBC
Granulocyte percent: 56.5 %G (ref 37–80)
HCT, POC: 37.3 % — AB (ref 37.7–47.9)
Hemoglobin: 11.7 g/dL — AB (ref 12.2–16.2)
Lymph, poc: 3.7 — AB (ref 0.6–3.4)
MCH, POC: 23.3 pg — AB (ref 27–31.2)
MCHC: 31.3 g/dL — AB (ref 31.8–35.4)
MCV: 74.4 fL — AB (ref 80–97)
MID (cbc): 0.3 (ref 0–0.9)
MPV: 7.9 fL (ref 0–99.8)
POC Granulocyte: 5.2 (ref 2–6.9)
POC LYMPH PERCENT: 40.3 % (ref 10–50)
POC MID %: 3.2 %M (ref 0–12)
Platelet Count, POC: 352 10*3/uL (ref 142–424)
RBC: 5.01 M/uL (ref 4.04–5.48)
RDW, POC: 13.1 %
WBC: 9.2 10*3/uL (ref 4.6–10.2)

## 2017-12-16 LAB — POCT GLYCOSYLATED HEMOGLOBIN (HGB A1C): Hemoglobin A1C: 9.3 % — AB (ref 4.0–5.6)

## 2017-12-16 LAB — GLUCOSE, POCT (MANUAL RESULT ENTRY): POC Glucose: 265 mg/dL — AB (ref 70–99)

## 2017-12-16 MED ORDER — METFORMIN HCL 500 MG PO TABS: 500.0000 mg | ORAL_TABLET | Freq: Two times a day (BID) | ORAL | 2 refills | Status: DC

## 2017-12-16 MED ORDER — GLIMEPIRIDE 4 MG PO TABS: 4.0000 mg | ORAL_TABLET | Freq: Every day | ORAL | 4 refills | Status: DC

## 2017-12-16 NOTE — Progress Notes (Signed)
Cathy Robbins  MRN: [id number] DOB: 1956-06-01  PCP: Dorise Hiss, PA-C  Subjective:  Pt is a 62 year old female who presents to clinic for dizziness. She speaks [place]. She is here today with her husband who is interpreting.  Episodes of dizziness happen occasionally. Looks like little balloons.  Denies room spinning, syncope  DM x 2 years- Glimepiride 106m andMetformin 500 mg bid. She has been out of her Glimepiride for over one week.  HTN - controlled with Lisinopril 127mqd. Blood pressure today is 124/77. Denies headache, chest pain, palpitations, leg swelling.   HLD - Garlic supplement 70176g, gemfibrozil 600 mg twice daily, Atorvastatin 4056m   Review of Systems  Respiratory: Negative for cough and shortness of breath.   Cardiovascular: Negative for chest pain and palpitations.  Neurological: Positive for dizziness. Negative for seizures, syncope, weakness and light-headedness.    Patient Active Problem List   Diagnosis Date Noted  . Type 2 diabetes mellitus without complication, without long-term current use of insulin (HCCGentry5/22/2017  . Hyperlipidemia 11/20/2015    Current Outpatient Medications on File Prior to Visit  Medication Sig Dispense Refill  . atorvastatin (LIPITOR) 40 MG tablet Take 1 tablet (40 mg total) by mouth daily. 90 tablet 3  . Garlic 705160 CAPS Take 1 capsule (705 mg total) by mouth daily. 60 capsule 4  . gemfibrozil (LOPID) 600 MG tablet Take 1 tablet (600 mg total) by mouth 2 (two) times daily before a meal. 60 tablet 4  . glimepiride (AMARYL) 4 MG tablet Take 1 tablet (4 mg total) by mouth daily. Take with breakfast or the first main meal. 60 tablet 4  . lisinopril (PRINIVIL,ZESTRIL) 10 MG tablet TAKE 1 TABLET(10 MG) BY MOUTH DAILY 60 tablet 6  . metFORMIN (GLUCOPHAGE) 500 MG tablet Take 500 mg by mouth 2 (two) times daily with a meal.    . Omega-3 Fatty Acids (FISH OIL) 1000 MG CAPS Take by mouth.    . traMADol-acetaminophen  (ULTRACET) 37.5-325 MG tablet Take 1 tablet by mouth every 6 (six) hours as needed. 30 tablet 0  . trolamine salicylate (ASPERCREME/ALOE) 10 % cream Apply 1 application topically as needed for muscle pain. 85 g 0  . meloxicam (MOBIC) 7.5 MG tablet TAKE 1 TABLET(7.5 MG) BY MOUTH DAILY. MAX DOSE 15 MG PER DAY (Patient not taking: Reported on 12/16/2017) 60 tablet 0   No current facility-administered medications on file prior to visit.     No Known Allergies   Objective:  BP 124/77 (BP Location: Left Arm, Patient Position: Sitting, Cuff Size: Normal)   Pulse 79   Temp 98 F (36.7 C) (Oral)   Ht '5\' 1"'  (1.549 m)   Wt 110 lb 12.8 oz (50.3 kg)   SpO2 98%   BMI 20.94 kg/m   Orthostatic VS for the past 24 hrs:  BP- Lying Pulse- Lying BP- Sitting Pulse- Sitting BP- Standing at 0 minutes Pulse- Standing at 0 minutes  12/16/17 1024 135/72 67 98/59 66 126/75 74    Physical Exam  Constitutional: She is oriented to person, place, and time. No distress.  Eyes: Conjunctivae and EOM are normal.  Cardiovascular: Normal rate, regular rhythm and normal heart sounds.  Neurological: She is alert and oriented to person, place, and time. She has normal strength. No cranial nerve deficit. She displays a negative Romberg sign.  Skin: Skin is warm and dry.  Psychiatric: Judgment normal.  Vitals reviewed.   Results for orders placed  or performed in visit on 12/16/17  POCT CBC  Result Value Ref Range   WBC 9.2 4.6 - 10.2 K/uL   Lymph, poc 3.7 (A) 0.6 - 3.4   POC LYMPH PERCENT 40.3 10 - 50 %L   MID (cbc) 0.3 0 - 0.9   POC MID % 3.2 0 - 12 %M   POC Granulocyte 5.2 2 - 6.9   Granulocyte percent 56.5 37 - 80 %G   RBC 5.01 4.04 - 5.48 M/uL   Hemoglobin 11.7 (A) 12.2 - 16.2 g/dL   HCT, POC 37.3 (A) 37.7 - 47.9 %   MCV 74.4 (A) 80 - 97 fL   MCH, POC 23.3 (A) 27 - 31.2 pg   MCHC 31.3 (A) 31.8 - 35.4 g/dL   RDW, POC 13.1 %   Platelet Count, POC 352 142 - 424 K/uL   MPV 7.9 0 - 99.8 fL  POCT  glycosylated hemoglobin (Hb A1C)  Result Value Ref Range   Hemoglobin A1C 9.3 (A) 4.0 - 5.6 %   HbA1c, POC (prediabetic range)  5.7 - 6.4 %   HbA1c, POC (controlled diabetic range)  0.0 - 7.0 %  POCT glucose (manual entry)  Result Value Ref Range   POC Glucose 265 (A) 70 - 99 mg/dl  POCT urinalysis dipstick  Result Value Ref Range   Color, UA yellow yellow   Clarity, UA cloudy (A) clear   Glucose, UA =250 (A) negative mg/dL   Bilirubin, UA negative negative   Ketones, POC UA negative negative mg/dL   Spec Grav, UA 1.025 1.010 - 1.025   Blood, UA negative negative   pH, UA 6.0 5.0 - 8.0   Protein Ur, POC =30 (A) negative mg/dL   Urobilinogen, UA 1.0 0.2 or 1.0 E.U./dL   Nitrite, UA Negative Negative   Leukocytes, UA Small (1+) (A) Negative    Assessment and Plan :  1. Dizziness -Patient presents with dizziness, suspected due to uncontrolled sugars..  Sugars are in the 200s with an A1c of 9.  She ran out of her medications and did not get refills discussed with patient need to control sugars with daily medications.  Take metformin 500 mg twice daily and Amaryl 4 mg twice daily.  Return to clinic in 2 weeks for recheck - CMP14+EGFR - Orthostatic vital signs - POCT urinalysis dipstick  2. Screening for deficiency anemia - POCT CBC  3. High blood sugar - POCT glycosylated hemoglobin (Hb A1C) - POCT glucose (manual entry)  4. Type 2 diabetes mellitus without complication, without long-term current use of insulin ([place]) -Uncontrolled  Cathy Armiyah Capron, PA-C  Primary Care at [facility] 12/16/2017 10:12 AM

## 2017-12-16 NOTE — Patient Instructions (Addendum)
I think your dizziness is because your blood sugar is so high. It is important to take these medications every day. Do not run out. Always come back for refills.   To control your blood sugar: Take Amyril 4mg  once every morning with breakfast.  Take Metformin 500 mg with breakfast and with dinner.   Come back and see me in 2 weeks.     B?nh ti?u ???ng v dinh d??ng Diabetes Mellitus and Nutrition Khi qu v? b? ti?u ???ng (b?nh ti?u ???ng), ?i?u r?t quan tr?ng l ph?i c thi quen ?n u?ng lnh m?nh b?i v nh?ng th? qu v? ?n v u?ng ?nh h??ng l?n ??n n?ng ?? ???ng huy?t (glucose) c?a qu v?. ?n th?c ph?m lnh m?nh v?i s? l??ng v?a ph?i, vo cng kho?ng th?i gian hng ngy, c th? gip qu v?:  Ki?m sot ???ng huy?t.  Gi?m nguy c? b? b?nh tim.  C?i thi?n huy?t p.  ??t ???c ho?c duy tr m??c cn n?ng c l?i cho s?c kh?e.  M?i ng??i b? ti?u ???ng khc nhau v m?i ng??i ??u c nhu c?u v? ch??ng trnh ?n u?ng khc nhau. Chuyn gia ch?m [place] s?c kh?e c?a qu v? c th? [place]khuyn qu v? trao ??i v?i m?t chuyn gia v? ch? ?? ?n v dinh d??ng (chuyn gia dinh d??ng) ?? xy d?ng ch??ng trnh ?n u?ng ph h?p nh?t v?i qu v?. Ch??ng trnh ?n u?ng c?a qu v? c th? thay ??i ty thu?c vo cc y?u t? nh?:  L??ng calo quy? vi? c?n.  Cc lo?i thu?c m quy? vi? du?ng.  Cn n??ng cu?a quy? vi?.  N?ng ?? ???ng huy?t, huy?t p v cholesterol c?a qu v?.  M?c ?? ho?t ??ng c?a qu v?.  Cc tnh tr?ng s?c kh?e khc m qu v? c, ch?ng h?n b?nh tim ho?c b?nh th?n.  Carbohydrate ?nh h??ng nh? th? no ??n ti? Carbohydrate ?nh h??ng ??n n?ng ?? ???ng huy?t c?a quy? vi? nhi?u h?n b?t k? lo?i th?c ph?m no khc. ?n carbohydrate theo cch t? nhin lm t?ng l??ng glucose trong mu qu v?. Ti?nh l???ng carbohydrate l m?t ph??ng php theo di l??ng carbohydrate m quy? vi? ?n. Ti?nh l???ng carbohydrate c vai tr quan tr?ng trong vi?c gi? cho ???ng huy?t cu?a quy? vi? ? m?c c l?i cho s?c kh?e, ??c bi?t l n?u quy?  vi? ?ang du?ng insulin ho?c u?ng m?t s? thu?c nh?t ??nh tri? ti?u ???ng. Bi?t ???c l??ng carbohydrate m qu v? c th? ?n m?t cch an ton trong m?i b?a ?n c vai tr quan tr?ng. L??ng carbohydrate ny khc nhau ? m?i ng??i. Chuyn gia dinh d??ng c?a qu v? c th? gip tnh ton l??ng carbohydrate m qu v? nn ?n vo m?i b?a ?n v b?a ?n nh?. Cc th?c ph?m ch?a carbohydrate bao g?m:  Bnh m, ng? c?c, c?m, m ?ng v bnh quy gin.  Khoai ty v ng.  ??u H Lan, ??u v ??u l?ng.  S?a v s?a chua.  Tri cy v n??c p tri cy.  ?? trng mi?ng, ch?ng h?n nh? bnh ng?t, bnh quy, kem v k?o.  R??u ?nh h??ng ??n ti nh? th? no? R??u c th? gy gi?m ???ng huy?t (h? ???ng huy?t) ??t ng?t, ??c bi?t l n?u qu v? s? d?ng insulin ho?c u?ng m?t s? thu?c nh?t ??nh tr? ti?u ???ng. H? ???ng huy?t c th? l m?t tnh tr?ng ?e d?a [place]?ng s?ng. Cc tri?u ch?ng c?a h? ???ng huy?t (bu?n ng?, chng m?t  v l l?n) t??ng t? nh? cc tri?u ch?ng c?a vi?c u?ng qu nhi?u r??u. N?u chuyn gia ch?m [place] s?c kh?e ni r?ng r??u an ton cho qu v?, hy tun theo cc h??ng d?n sau:  Gi?i h?n l??ng r??u qu v? u?ng khng qu 1 ly m?i ngy v?i ph? n? khng mang thai v 2 ly m?i ngy v?i nam gi?i. M?t ly t??ng ???ng v?i 12 ao-x? bia, 5 ao-x? r??u vang, ho?c 1 ao-x? r??u m?nh.  Khng u?ng r???u khi ?o?i.  Lun b ?? n??c b?ng n?c, soda cho ng??i ?n king, ho?c tr ? khng ???ng.  Tamera Punt nh? r?ng soda thng th???ng, n??c tri cy v ca?c ?? u?ng h?n h??p khc c th? ch?a nhi?u ???ng v ph?i ???c tnh l carbohydrate.  Nh?ng l?i [place] tun th? theo ch??ng trnh ny l g? ??c nhn th?c ph?m  B?t ??u b?ng vi?c ki?m tra kch th??c kh?u ph?n trn nhn. L??ng calo, carbohydrate, ch?t bo v cc ch?t dinh d??ng khc ghi trn nhn d?a trn m?t kh?u ph?n th?c ?n. R?t nhi?u lo?i th?c ph?m ch?a nhi?u h?n m?t kh?u ph?n trn m?i bao b.  Ki?m tra t?ng s? gam (g) carbohydrate trong m?t kh?u ph?n. Qu v? c th? tnh s? kh?u  ph?n carbohydrate trong m?t kh?u ph?n b?ng cch chia t?ng carbohydrate cho 15. V d?, m?t th?c ph?m ch?a t?ng 30 g carbohydrate, n s? t??ng ???ng v?i 2 kh?u ph?n carbohydrate.  Ki?m tra s? gam (g) ch?t bo bo ha v chuy?n ha trong m?t kh?u ph?n. Ch?n th?c ph?m t ho?c khng c nh?ng ch?t bo ny.  Ki?m tra s? miligam (mg) natri trong m?i kh?u ph?n. H?u h?t m?i ng??i ??u nn gi?i h?n t?ng natri tiu thu? ?? m??c d??i 2.300 mg m?i ngy.  Lun ki?m tra thng tin dinh d??ng th?c ph?m c ???c ghi l "t bo" hay "khng bo" khng. Nh?ng th?c ph?m ny c th? c hm l??ng ???ng ph? gia ho?c carbohydrate tinh ch? cao h?n v nn trnh.  Hy trao ??i v?i chuyn gia dinh d??ng c?a qu v? ?? xc ??nh m?c tiu hng ngy ??i v?i cc ch?t dinh d??ng ghi trn nhn. Mua s?m  Trnh mua nh?ng th?c ph?m ?ng h?p, lm s?n, ho?c ch? bi?n s?n. Nh?ng th?c ph?m ny c xu h??ng c nhi?u ch?t bo, natri, ???ng b? sung h?n.  Mua s?m quanh ra ngoi c?a c?a hng t?p ha. Ch? ngy t?p trung tri cy v rau c? t??i, nhi?u lo?i h?t, th?t t??i v s?a t??i. N?u n??ng  S? d?ng ca?c ph??ng php n?u ?n nhi?t ?? th?p, ch?ng h?n nh? n??ng, thay v ph??ng php n?u nhi?t ?? cao nh? chin ng?p d?u m??.  N?u ?n b?ng cch s? d?ng cc lo?i d?u t?t cho s?c kh?e, ch?ng h?n nh? d?u  liu, [place] ho?c h??ng d??ng.  [place] n?u v?i b?, kem, ho?c th?t nhi?u m?. Ln k? ho?ch cho b?a ?n  ?n cc b?a ?n chnh v cc b?a ?n nh? ??u ??n, t?t nh?t l vo cng m?t th?i ?i?m m?i ngy. Trnh nhi?n ?n trong th?i [employer].  ?n th?c ?n giu ch?t x? nh? tri cy v rau t??i, ??u v ng? c?c nguyn h?t. Trao ??i v?i chuyn gia dinh d??ng xem qu v? c th? ?n bao nhiu kh?u ph?n carbohydrate m?i b?a ?n.  ?n 4-6 ao-x? protein th?t n?c m?i ngy, ch?ng h?n nh? th?t n?c, th?t g, c, tr?ng, ho?c ??u ph?. 1  aox? t??ng ???ng v?i 1 aox? th?t, th?t g, ho?c c, 1 qu? tr?ng, ho?c 1/4 c?c ??u ph?.  ?n m?t s? th?c ph?m m?i ngy ch?a cc ch?t bo lnh m?nh,  ch?ng h?n nh? l tu, qu? h?ch, cc lo?i h?t v c. L?i s?ng   Ki?m tra ???ng huy?t th??ng xuyn.  T?p th? d?c t?i thi?u 30 pht/ngy, t? 5 ngy tr? ln m?i tu?n, ho?c theo ch? d?n c?a chuyn gia ch?m [place] s?c kh?e.  Dng thu?c theo ch? d?n c?a chuyn gia ch?m [place] s?c kh?e.  Khng s? d?ng b?t k? s?n ph?m no ch?a nicotine ho?c thu?c l, ch?ng ha?n nh? thu?c l d?ng ht v thu?c l ?i?n t?. N?u qu v? c?n gip ?? ?? cai thu?c, hy h?i chuyn gia ch?m [place] s?c kh?e.  Lm vi?c v?i t? v?n vin ho?c chuyn gia gio d?c ti?u ???ng ?? xc ??nh chi?n l??c qu?n l c?ng th?ng v b?t c? thch th?c no v? c?m xc v x h?i. M?t s? cu h?i c?n ??t ra v?i chuyn gia ch?m [place] s?c kh?e c?a ti l g?  Ti co? c?n g??p m?t chuyn gia h???ng d?n v? b?nh ti?u ????ng khng?  Ti c c?n g?p chuyn gia dinh d??ng khng?  Ti co? th? go?i cho s? na?o n?u ti co? th?c m?c?  Khi no l th?i ?i?m ki?m tra ???ng huy?t t?t nh?t? N?i ?? tm thm thng tin:  Hi?p h?i Ti?u ???ng [place]?: [email or web address]  Vi?n Dinh D??ng v Ti?u ???ng: [email or web address]  Vi?n Ti?u ???ng v cc B?nh v? Tiu [place] v Th?n Qu?c Gia (NIH): [email or web address] Tm t?t  Ch??ng trnh b?a ?n lnh m?nh s? gip qu v? ki?m sot ???ng huy?t v duy tr l?i s?ng lnh m?nh.  Lm vi?c v?i m?t chuyn gia v? dinh d??ng v ch? ?? ?n (chuyn gia dinh d??ng) c th? gip qu v? l?p k? ho?ch b?a ?n ph h?p nh?t cho qu v?.  Lun nh? r?ng carbohydrate v r??u c tc ??ng ngay l?p t?c ln n?ng ?? ???ng huy?t c?a qu v?. ?i?u quan tr?ng l ph?i tnh carbohydrate v s? d?ng m?t r??u cch th?n tr?ng. Thng tin ny khng nh?m m?c ?ch thay th? cho l?i khuyn m chuyn gia ch?m [place] s?c kh?e ni v?i qu v?. Hy b?o ??m qu v? ph?i th?o lu?n b?t k? v?n ?? g m qu v? c v?i chuyn gia ch?m [place] s?c kh?e c?a qu v?. Document  Released: 10/09/2015 Document Revised: 10/17/2016 Document Reviewed: 10/17/2016 Elsevier Interactive Patient Education  2018 [employer].  IF you received an x-ray today, you will receive an invoice from [facility]. Please contact [facility] at [phone] with questions or concerns regarding your invoice.   IF you received labwork today, you will receive an invoice from [facility]. Please contact [facility] at [phone] with questions or concerns regarding your invoice.   Our billing staff will not be able to assist you with questions regarding bills from these companies.  You will be contacted with the lab results as soon as they are available. The fastest way to get your results is to activate your My Chart account. Instructions are located on the last page of this paperwork. If you have not heard from [place] regarding the results in 2 weeks, please contact this office.

## 2017-12-19 ENCOUNTER — Encounter: Payer: Self-pay | Admitting: Physician Assistant

## 2017-12-19 ENCOUNTER — Ambulatory Visit: Payer: BLUE CROSS/BLUE SHIELD | Admitting: Physician Assistant

## 2017-12-19 ENCOUNTER — Other Ambulatory Visit: Payer: Self-pay

## 2017-12-19 VITALS — BP 109/66 | HR 75 | Temp 98.4°F | Resp 16 | Ht 61.0 in | Wt 108.6 lb

## 2017-12-19 DIAGNOSIS — R82998 Other abnormal findings in urine: Secondary | ICD-10-CM | POA: Diagnosis not present

## 2017-12-19 DIAGNOSIS — E119 Type 2 diabetes mellitus without complications: Secondary | ICD-10-CM

## 2017-12-19 DIAGNOSIS — R42 Dizziness and giddiness: Secondary | ICD-10-CM | POA: Diagnosis not present

## 2017-12-19 DIAGNOSIS — R112 Nausea with vomiting, unspecified: Secondary | ICD-10-CM

## 2017-12-19 LAB — POCT URINALYSIS DIP (MANUAL ENTRY)
Bilirubin, UA: NEGATIVE
Blood, UA: NEGATIVE
Glucose, UA: 250 mg/dL — AB
Ketones, POC UA: NEGATIVE mg/dL
Nitrite, UA: NEGATIVE
Spec Grav, UA: 1.015 (ref 1.010–1.025)
Urobilinogen, UA: 0.2 E.U./dL
pH, UA: 6 (ref 5.0–8.0)

## 2017-12-19 LAB — GLUCOSE, POCT (MANUAL RESULT ENTRY): POC Glucose: 115 mg/dl — AB (ref 70–99)

## 2017-12-19 MED ORDER — ONDANSETRON 8 MG PO TBDP: 8.0000 mg | ORAL_TABLET | Freq: Three times a day (TID) | ORAL | 0 refills | Status: DC | PRN

## 2017-12-19 MED ORDER — METFORMIN HCL ER 500 MG PO TB24: ORAL_TABLET | ORAL | 5 refills | Status: DC

## 2017-12-19 MED ORDER — CONTOUR NEXT CONTROL NORMAL VI SOLN: 1.0000 [drp] | 2 refills | Status: DC | PRN

## 2017-12-19 MED ORDER — ALCOHOL WIPES 70 % PADS: 1.0000 "application " | MEDICATED_PAD | Freq: Two times a day (BID) | 5 refills | Status: DC

## 2017-12-19 NOTE — Patient Instructions (Addendum)
Ti ?ang chuy?n thu?c t? metformin sang pht hnh m? r?ng Metformin - ?i?u ny s? gip gi?m tiu ch?y c?a b?n.  Li?u l??ng Metformin (???c u?ng cng v?i th?c ?n) Tu?n 1: u?ng 1/2 vin hai l?n m?t ngy. Tu?n 2: u?ng 1 vin vo bu?i sng, 1/2 vin vo ban ?m. Tu?n 3: u?ng 1 vin hai l?n m?t ngy.  ?n nhi?u b?a nh? v u?ng t?ng ng?m n??c nh? trong su?t c? ngy Trnh cc ho?t ??ng ho?c ??ng ??t ng?t ngay sau khi ?n  Hy quay l?i v g?p ti sau m?t tu?n ?? ki?m tra l?i cc tri?u ch?ng c?a b?n.  -------------- I am switching your medicine from metformin to Metformin extended release - this will help reduce your diarrhea.   Metformin Dosing (to be taken with food) Week 1: take 1/2 tablet twice a day. Week 2: take 1 tablet in the morning, 1/2 tablet at night. Week 3: take 1 tablet twice a day.  Come back and see me in one week to recheck your symptoms.     L?a ch?n th?c ph?m gip lm gi?m tiu ch?y, Ng??i l?n Food Choices to Help Relieve Diarrhea, Adult Khi qu v? b? tiu ch?y, th?c ph?m qu v? ?n v thi quen ?n u?ng c?a qu v? l r?t quan tr?ng. Ch?n th?c ph?m v ?? u?ng ph h?p c th? gip:  Gi?m b?t tiu ch?y.  Thay th? cc dinh d??ng v ch?t l?ng ? m?t.  Phng ng?a m?t n??c.  Nh?ng h??ng d?n chung ti ph?i tun theo l g? Gi?m b?t tiu ch?y.  Ch?n th?c ph?m c hm l??ng d??i 2 g ho?c .07 aox?. ch?t x? trong m?i kh?u ph?n.  Gi?i h?n ch?t bo ??n d??i 8 tha c ph (38 g hay 1.34 aox?Marland Kitchen) m?i ngy.  Tra?nh nh??ng th?? sau: ? Th?c ph?m v ?? u?ng ???c lm ng?t b?ng xir ng giu fructoza, m?t, ho?c ???ng r??u, ch?ng h?n nh? xylitol, sorbitol v mannitol. ? Th?c ph?m ch?a nhi?u ch?t bo ho?c ???ng. ? Th?c ph?m chin/rn, nhi?u d?u m??, ho??c cay. ? Cc lo?i h?t giu ch?t x?, bnh m v ng? c?c. ? Tri cy v rau c? s?ng.  ?n th?c ph?m giu probiotic. Nh?ng th?c ph?m ny bao g?m cc s?n ph?m t? s?a, ch?ng h?n nh? s?a chua v s?n ph?m s?a ln men. Cc s?n ph?m ny gip t?ng  vi khu?n c l?i trong d? dy v ru?t non (???ng tiu ha, hay ???ng GI).  N?u qu v? khng dung n?p ???ng lactose, hy trnh dng nh?ng s?n ph?m t? s?a. Nh?ng s?n ph?m ny c th? lm cho tnh tr?ng tiu ch?y c?a qu v? tr?m tr?ng h?n.  Ch? dng thu?c ?? gip lm ng?ng tiu ch?y (thu?c ch?ng tiu ch?y) theo ch? d?n c?a chuyn gia ch?m [place] s?c kh?e. Thay th? cc ch?t dinh d??ng.  ?n cc b?a ?n nh? ho?c ?? ?n nh? 3-4 gi? m?t l?n.  ?n nh?ng ?? ?n nh?, ch?ng h?n nh? c?m tr?ng, bnh m n??ng, ho?c khoai ty n??ng, cho ??n khi tnh tr?ng tiu ch?y c?a qu v? c?i thi?n. T? t? s? d?ng l?i nh?ng th?c ph?m giu dinh d??ng khi dung n?p ???c ho?c theo ch? d?n c?a chuyn gia ch?m [place] s?c kh?e. Th?c ph?m ny bao g?m: ? Cc th?c ph?m c protein ? n?u chn k?. ? Tri cy v rau c? bc v?, b? h?t, ho?c n?u nh?. ? S?n ph?m t? s?a t bo.  S? d?ng vitamin  v b? sung khong ch?t theo ch? d?n c?a chuyn gia ch?m [place] s?c kh?e. Phng ng?a m?t n??c   B?t ??u u?ng t?ng h?p n??c ho?c u?ng m?t dung d?ch ??c bi?t ?? phng ng?a m?t n??c (dung d?ch b n??c qua ???ng u?ng, ORS). N??c ti?u trong ho?c c mu vng nh?t c ngh?a l qu v? ?ang nh?n ?? n??c.  C? g?ng u?ng t?i thi?u 8-10 ly n??c m?i ngy ?? gip thay th? d?ch ? m?t.  Qu v? c th? b? sung cc ?? l?ng khc ngoi n??c, ch?ng h?n nh? n??c p trong ho?c ?? u?ng th? thao ? b? caffein, khi dung n?p ???c ho?c theo ch? d?n c?a chuyn gia ch?m [place] s?c kh?e.  Trnh nh?ng ?? u?ng c caffein, ch?ng h?n nh? c ph, tr, ho?c n??c ng?t.  Trnh u?ng r??u. Nh?ng lo?i th?c ?n no ???c khuy?n ngh?? Nh?ng m?c ???c li?t k c th? khng ph?i danh sch ??y ??. Hy trao ??i v?i chuyn gia ch?m [place] s?c kh?e xem l?a ch?n ch? ?? ?n no l ph h?p nh?t v?i qu v?. Ng? c?c G?o tr?ng. Bnh m tr?ng, bnh m Php, ho?c bnh m pita (t??i ho?c n??ng), g?m c? ? bnh m trn tr?n, bnh bao nhn nho, ho?c bnh m ki?u vng. M ?ng tr?ng. Bnh quy m?n, soda, ho?c bnh quy gin x?p. Bnh  quy cy. Ng? c?c t x?. Ng? c?c n?u chn v?i n??c (ch?ng h?n b?t ng th, b?t, ho?c ng? c?c kem). Bnh n??ng x?p ??n gi?n. Bnh matzo. Bnh m n??ng melba. Bnh quy m?n. [place] ty (khng v?). H?u h?t cc lo?i rau c? n?u k? v ?ng h?p khng c v? ho?c h?t. Rau di?p non. Tri cy To d?m ???ng. Tri cy ?ng h?p trong n??c p. Qu? m?, qu? anh ?o, qu? b??i chm, qu? ?o, qu? l, ho?c qu? m?n n?u chn. Chu?i v d?a ?? t??i. Th?t v cc th?c ph?m ch?a protein khc Th?t g lu?c ho?c b? l. Tr?ng. ??u ph?. C. H?i s?n. B? h?t m?n. Th?t b m?m ???c nghi?n ho?c n?u chn k?, gi?m bng, th?t b, th?t c?u, th?t l?n, gia c?m. S?a. S?a chua khng ???ng, kefir v s?a chua d?ng l?ng khng ???ng. S?a khng c ???ng lactose, kem s?a, s?a ? l?y h?t kem, ho?c s?a ??u nnh. Ph mt c?ng khng bo ho?c t bo. ?? u?ng N??c. ?? u?ng th? thao t calo. N??c p tri cy khng b?t. C chua l?c l?y n??c v n??c p rau c?. Tr ? b? caffein. ?? u?ng khng ???ng khng ???c lm ng?t b?i ???ng r??u. Dung d?ch b n??c d?ng u?ng, n?u ???c chuyn gia ch?m [place] s?c kh?e cho php. Gia v? v cc th?c ph?m khc. N??c canh th?t, n??c xut, ho?c sp lm t? th?c ph?m khuyn dng. Nh?ng lo?i th?c ?n no khng ???c khuy?n ngh?? Nh?ng m?c ???c li?t k c th? khng ph?i danh sch ??y ??. Hy trao ??i v?i chuyn gia ch?m [place] s?c kh?e xem l?a ch?n ch? ?? ?n no l ph h?p nh?t v?i qu v?. Ng? c?c Ng? c?c nguyn h?t, la m nguyn h?t, cm, ho?c bnh m m?ch ?en, ? bnh m, m ?ng v bnh quy gin. G?o d?i ho?c g?o l?t. Ng? c?c nguyn h?t ho?c ng? c?c nguyn cm. Ashleych v b?t y?n m?ch. Bnh b?p tortilla ho?c bnh ?a taco. Mn granola. B?ng ng. Egbert Garibaldi s?ng. Rau chin. C?i b?p, cy bng c?i xanh,  C?i bruxen, cy atis, ??u n??ng, l c? c?i ??, b?p ng, c?i xo?n, qu? ??u, ??u H Lan, khoai ty ng?t v c? t?. V? khoai ty. Rau bina v c?i b?p n?u chn. Tri cy Tri cy s?y kh, g?m c? nho kh v qu? ch l. Tri cy s?ng. M?n  s?y kh ho?c h?m. Tri cy ?ng h?p v?i xi-r. Th?t v cc th?c ph?m ch?a protein khc Th?t rn ho?c th?t c m?Marland Kitchen Th?t deli. B? h?t r?n. Qu? h?ch v cc lo?i h?t. ??u v ??u l?ng. Th?t l?n mu?i xng khi. Xc xch. L?p x??ng. S?a. Pho mt giu ch?t bo. S?a nguyn kem, s?a s-c-la v ?? u?ng pha v?i s?a, ch?ng h?n nh? c?c s?a tr?ng ? khu?y. N?a n? n?a kia. Kem. kem chua. Kem. ?? u?ng ?? u?ng c caffein (ch?ng h?n nh? c ph, tr, soda, ho?c ?? u?ng t?ng l?c). ?? u?ng c c?n. N??c p tri cy c b?t. N??c p m?n. N??c ng?t ???c t?o ng?t b?ng xi-r ng giu fructoza ho?c ???ng r??u. ?? u?ng th? thao nhi?u calo. M? v d?u B?. X?t kem. B? th?c v?t. D?u salad. D?u d?m salad.  liu. Qu? b?. N??c x?t mayonnaise. K?o v ?? trng mi?ng ? bnh m ng?t, bnh rn v bnh m ng?t. ?? trng mi?ng khng ???ng ???c t?o ng?t b?ng ???ng r??u, ch?ng h?n nh? xylitol v sorbitol. Gia v? v cc th?c ph?m khc. M?t ong. T??ng ?t. B?t ?t. N??c th?t. Sp kem ho?c s?a. Bnh k?p v bnh qu?. Tm t?t  Khi qu v? b? tiu ch?y, th?c ph?m qu v? ?n v thi quen ?n u?ng c?a qu v? l r?t quan tr?ng.  ??m b?o r?ng qu v? u?ng t?i thi?u 8-10 ly n??c m?i ngy, ho?c ?? ?? lm cho n??c ti?u trong ho?c c mu vng nh?t.  ?n ?? ?n nh? v t? t? s? d?ng l?i nh?ng ?? ?n giu dinh d??ng, c l?i cho s?c kh?e khi dung n?p ???c ho?c theo ch? d?n c?a chuyn gia ch?m [place] s?c kh?e.  Trnh nh?ng th?c ph?m giu ch?t x?, chin/rn, nhi?u m?, ho?c cay. Thng tin ny khng nh?m m?c ?ch thay th? cho l?i khuyn m chuyn gia ch?m [place] s?c kh?e ni v?i qu v?. Hy b?o ??m qu v? ph?i th?o lu?n b?t k? v?n ?? g m qu v? c v?i chuyn gia ch?m [place] s?c kh?e c?a qu v?. Document Released: 09/30/2016 Document Revised: 09/30/2016 Document Reviewed: 09/30/2016 Elsevier Interactive Patient Education  2018 [employer].   Thank you for coming in today. I hope you feel we met your needs.  Feel free to call PCP if you have any questions or further  requests.  Please consider signing up for MyChart if you do not already have it, as this is a great way to communicate with me.  Best,  Whitney McVey, PA-C  IF you received an x-ray today, you will receive an invoice from [facility]. Please contact [facility] at [phone] with questions or concerns regarding your invoice.   IF you received labwork today, you will receive an invoice from [place]. Please contact [facility] at [phone] with questions or concerns regarding your invoice.   Our billing staff will not be able to assist you with questions regarding bills from these companies.  You will be contacted with the lab results as soon as they are available. The fastest way to get your results is to activate your My Chart account. Instructions are located on the  last page of this paperwork. If you have not heard from [place] regarding the results in 2 weeks, please contact this office.

## 2017-12-19 NOTE — Progress Notes (Signed)
Cathy Robbins  MRN: [id number] DOB: 1955/07/07  PCP: Sebastian AcheMcVey, Baani Bober Whitney, PA-C  Subjective:  Pt is a 62 year old female who presents to clinic for diarrhea.   She was here 12/16/2017 for dizziness. She had a blood sugar of 265. She had not been taking diabetes medication.  She had diarrhea, vomiting and dizziness started 3 days ago.  Last dose of metformin was yesterday afternoon.  Medication has a strong smell "that makes me want to vomit".   Dizziness lasts about 20 min. Laying down makes it better. Episodes happen a few times/day.   Review of Systems  Constitutional: Negative for chills, diaphoresis, fatigue and fever.  Gastrointestinal: Positive for abdominal pain, diarrhea, nausea and vomiting.  Neurological: Positive for dizziness.    Patient Active Problem List   Diagnosis Date Noted  . Type 2 diabetes mellitus without complication, without long-term current use of insulin (HCC) 11/20/2015  . Hyperlipidemia 11/20/2015    Current Outpatient Medications on File Prior to Visit  Medication Sig Dispense Refill  . atorvastatin (LIPITOR) 40 MG tablet Take 1 tablet (40 mg total) by mouth daily. 90 tablet 3  . Garlic 705 MG CAPS Take 1 capsule (705 mg total) by mouth daily. 60 capsule 4  . gemfibrozil (LOPID) 600 MG tablet Take 1 tablet (600 mg total) by mouth 2 (two) times daily before a meal. 60 tablet 4  . glimepiride (AMARYL) 4 MG tablet Take 1 tablet (4 mg total) by mouth daily. Take with breakfast or the first main meal. 60 tablet 4  . lisinopril (PRINIVIL,ZESTRIL) 10 MG tablet TAKE 1 TABLET(10 MG) BY MOUTH DAILY 60 tablet 6  . metFORMIN (GLUCOPHAGE) 500 MG tablet Take 1 tablet (500 mg total) by mouth 2 (two) times daily with a meal. 180 tablet 2  . Omega-3 Fatty Acids (FISH OIL) 1000 MG CAPS Take by mouth.    . traMADol-acetaminophen (ULTRACET) 37.5-325 MG tablet Take 1 tablet by mouth every 6 (six) hours as needed. 30 tablet 0  . trolamine salicylate (ASPERCREME/ALOE) 10 %  cream Apply 1 application topically as needed for muscle pain. 85 g 0   No current facility-administered medications on file prior to visit.     No Known Allergies   Objective:  BP 109/66 (BP Location: Right Arm, Patient Position: Sitting, Cuff Size: Normal)   Pulse 75   Temp 98.4 F (36.9 C) (Oral)   Resp 16   Ht 5\' 1"  (1.549 m)   Wt 108 lb 9.6 oz (49.3 kg)   SpO2 98%   BMI 20.52 kg/m   Physical Exam  Results for orders placed or performed in visit on 12/19/17  POCT urinalysis dipstick  Result Value Ref Range   Color, UA yellow yellow   Clarity, UA clear clear   Glucose, UA =250 (A) negative mg/dL   Bilirubin, UA negative negative   Ketones, POC UA negative negative mg/dL   Spec Grav, UA 4.0981.015 1.1911.010 - 1.025   Blood, UA negative negative   pH, UA 6.0 5.0 - 8.0   Protein Ur, POC trace (A) negative mg/dL   Urobilinogen, UA 0.2 0.2 or 1.0 E.U./dL   Nitrite, UA Negative Negative   Leukocytes, UA Large (3+) (A) Negative  POCT glucose (manual entry)  Result Value Ref Range   POC Glucose 115 (A) 70 - 99 mg/dl    Assessment and Plan :  1. Dizziness 2. Type 2 diabetes mellitus without complication, without long-term current use of insulin (HCC) -Patient was here  last week and started on metformin twice daily.  Suspect diarrhea secondary to medication side effect.  Will change to extended release, titration schedule printed out and discussed - POCT urinalysis dipstick - Orthostatic vital signs - metFORMIN (GLUCOPHAGE-XR) 500 MG 24 hr tablet; Week 1: take 1/2 pill twice a day; Week 2: take 1 pill in the morning, 1/2 pill at night; WUJW1: 1 pill in morning and 1 pill at night  Dispense: 90 tablet; Refill: 5 - Alcohol Swabs (ALCOHOL WIPES) 70 % PADS; 1 application by Does not apply route 2 (two) times daily.  Dispense: 100 each; Refill: 5 - POCT glucose (manual entry) - Blood Glucose Calibration (CONTOUR NEXT CONTROL) Normal SOLN; 1 drop by In Vitro route as needed.  Dispense: 1  each; Refill: 2  3. Leukocytes in urine - Urine Culture  4. Nausea and vomiting, intractability of vomiting not specified, unspecified vomiting type -Well-controlled with symptomatic relief - ondansetron (ZOFRAN-ODT) 8 MG disintegrating tablet; Take 1 tablet (8 mg total) by mouth every 8 (eight) hours as needed for nausea. (Patient not taking: Reported on 12/24/2017)  Dispense: 30 tablet; Refill: 0   Marco Collie, PA-C  Primary Care at [facility] 12/19/2017 4:52 PM

## 2017-12-20 LAB — URINE CULTURE

## 2017-12-24 ENCOUNTER — Ambulatory Visit: Payer: BLUE CROSS/BLUE SHIELD | Admitting: Physician Assistant

## 2017-12-24 ENCOUNTER — Encounter: Payer: Self-pay | Admitting: Physician Assistant

## 2017-12-24 DIAGNOSIS — E119 Type 2 diabetes mellitus without complications: Secondary | ICD-10-CM | POA: Diagnosis not present

## 2017-12-24 MED ORDER — GLIMEPIRIDE 4 MG PO TABS: 4.0000 mg | ORAL_TABLET | Freq: Every day | ORAL | 4 refills | Status: DC

## 2017-12-24 NOTE — Patient Instructions (Addendum)
A1C c?a b?n l 9,3% (xem bn d??i ?? bi?t thng tin v? A1C). ?i?u ny c ngh?a l trung bnh hng ngy c?a l??ng ???ng trong mu c?a b?n l kho?ng 220.   Li?u l??ng Metformin (???c u?ng cng v?i th?c ?n) B?t ??u u?ng 1 vin vo bu?i sng cng v?i th?c ?n v 1/2 vin vo bu?i t?i trong m?t tu?n. Sau ? b?t ??u u?ng 1 vin vo bu?i sng v 1 vin vo bu?i t?i.  Ti?p t?c dng Glimepiride 4mg . ?n sng  Hy quay l?i v g?p ti sau 2 thng ?? ki?m tra l?i.  --------------------------------------------------------------------------------------------------------------------- Your A1C is 9.3% (see below for information about A1C). This means the daily average of your blood sugar is around 220.  Start taking 1 pill in the morning with food, and 1/2 pill at night for one week. Then start taking 1 pill in the morning and 1 pill at night.   Continue taking your Glimepiride 4mg .  Come back and see me in 2 months for recheck.   Xe?t nghi?m Hemoglobin A1c Hemoglobin A1c Test M?t l??ng ???ng (glucose) no ? l?u thng trong mu s? bm ho?c lin k?t v?i protein trong mu. Hemoglobin (Hb hay Hgb) l m?t lo?i protein trong mu [place]? ???ng lin k?t. N c?ng mang  xi trong h?ng c?u (RBCs). Khi ???ng lin k?t v?i Hb, Hb ????c ????ng bao b?c ????c go?i la? Hb phu? ????ng (glycated Hb). Khi Hb ????c phu? ????ng, n v?n nh? v?y trong vo?ng ???i c?a RBC. Vo?ng ???i da?i kho?ng 120 ngy. Thay v ki?m tra l??ng ???ng trong mu c?a quy? vi? vo m?t ngy duy nh?t, xe?t nghi?m hemoglobin A1c (HbA1c) ?o l???ng hemoglobin phu? ????ng trung bi?nh va? t?? ?o? ?o ????c l???ng ????ng trung bnh trong mu c?a quy? vi? trong th?i gian 3-4 thng ngay tr??c khi xe?t nghi?m ???c th?c hi?n. Xt nghi?m HbA1c ???c s? d?ng ?? theo di vi?c ki?m soa?t trong di h?n l??ng ???ng huy?t ? nh?ng ng??i bi? ti?u ???ng. Xt nghi?m HbA1c c th? c?ng ???c s? d?ng ?? b? sung ho?c k?t h?p v?i xe?t nghi?m l???ng ????ng huy?t lu?c ?o?i va? kha?  n?ng dung na?p ????ng qua mi?ng. Cc k?t qu? c  ngh?a g?  Qu v? ch?u trch nhi?m l?y k?t qu? xt nghi?m c?a mnh. Hy h?i phng xt nghi?m ho?c khoa th?c hi?n xt nghi?m xem khi no c v cch l?y k?t qu? c?a qu v?. Lin l?c v?i chuyn gia ch?m [place] s?c kh?e ?? th?o lu?n v? b?t k? cu h?i no m qu v? c v? k?t qu? c?a qu v?. Ph?m vi gi tr? bi?nh th???ng Pha?m vi gi tr? bnh th??ng c th? khc nhau gi?a cc phng xe?t nghi?m v b?nh vi?n kha?c nhau. Quy? vi? nn lun lun ki?m tra v?i chuyn gia ch?m [place] s?c kh?e sau khi ???c la?m ca?c xe?t nghi?m khc ?? th?o lu?n v?  ngh?a c?a k?t qu? xe?t nghi?m c?a quy? vi? v li?u cc gi tr? c?a quy? vi? co? ???c coi l trong gi?i h?n bnh th??ng hay khng. Ph?m vi k?t qu? xt nghi?m HbA1c bnh th??ng nh? sau:  Ng??i l?n ho?c tr? em khng c b?nh ti?u ???ng: 4-5,7%.  Ng??i l?n ho?c tr? em co? b?nh ti?u ???ng v ????c ki?m sot t?t ????ng huy?t: th?p h?n 7%.  M?t s? y?u t? c th? ?nh h??ng ??n k?t qu? xt nghi?m HbA1c. Nh?ng y?u t? ny c th? bao g?m:  B?nh t?t (b?nh huy?t  s?c t?) gy ra m?t s? thay ??i v? hnh d?ng, kch th??c ho?c l??ng Hb trong mu c?a quy? vi?.  Vo?ng ???i cu?a RBC da?i h?n bnh th??ng.  N?ng ?? c?a m?t s? protein nh?t ??nh trong mu c?a quy? vi? th?p m?t ca?ch b?t th???ng.  ?n nh?ng th?c ph?m ho?c du?ng th??c ph?m b? sung c nhi?u vitamin C (axit ascorbic).   ngh?a c?a k?t qu? ngoi pha?m vi gi tr? bnh th??ng Cc gia? tri? HbA1c cao b?t th???ng ph? bi?n nh?t l m?t d?u hi?u c?a ti?n ti?u ????ng va? ti?u ???ng:  K?t qu? HbA1c 5,7-6,4% ???c coi l tri?u ch??ng ti?n ti?u ????ng.  K?t qu? HbA1c 6,5% ho?c cao h?n trong hai l?n ring bi?t ???c coi l tri?u ch??ng b?nh ti?u ???ng.  Ca?c gi tr? HbA1c th?p b?t th???ng c th? do m?t s? ti?nh tra?ng s??c kho?e gy ra. Nh?ng tnh tr?ng ny c th? bao g?m:  [place].  M?t l??ng l?n [place]?u.  Truy?n mu.  L??ng h?ng c?u th?p (thi?u mu). ?i?u ny l  do s? ph h?y s??m c?a cc t? bo h?ng c?u gy ra.  Suy th?n ke?o da?i.  M?t s? d?ng Hb b?t th??ng (bi?n th? Hb), ch?ng h?n nh? h?ng c?u hnh li?m th? nh?.  Th?o lu?n v? k?t qu? xe?t nghi?m c?a quy? vi? v?i chuyn gia ch?m [place] s?c kh?e. Chuyn gia ?o? s? s? d?ng k?t qu? ?? ch?n ?on v xc ??nh m?t k? ho?ch ?i?u tr? ph h?p v?i quy? vi?. Trao ??i v?i chuyn gia ch?m [place] s?c kh?e cu?a quy? vi? ?? th?o lu?n v? k?t qu?, cc l?a ch?n ?i?u tr? v n?u c?n thi?t, c?n pha?i la?m thm cc xt nghi?m. Trao ??i v?i chuyn gia ch?m [place] s?c kh?e n?u quy? vi? co? b?t k? cu h?i no v? k?t qu? c?a qu v?. Thng tin ny khng nh?m m?c ?ch thay th? cho l?i khuyn m chuyn gia ch?m [place] s?c kh?e ni v?i qu v?. Hy b?o ??m qu v? ph?i th?o lu?n b?t k? v?n ?? g m qu v? c v?i chuyn gia ch?m [place] s?c kh?e c?a qu v?. Document Released: 10/09/2015 Document Revised: 09/30/2016 Document Reviewed: 11/01/2013 Elsevier Interactive Patient Education  2018 [employer].    IF you received an x-ray today, you will receive an invoice from [facility]. Please contact [facility] at [phone] with questions or concerns regarding your invoice.   IF you received labwork today, you will receive an invoice from [place]. Please contact [facility] at [phone] with questions or concerns regarding your invoice.   Our billing staff will not be able to assist you with questions regarding bills from these companies.  You will be contacted with the lab results as soon as they are available. The fastest way to get your results is to activate your My Chart account. Instructions are located on the last page of this paperwork. If you have not heard from [place] regarding the results in 2 weeks, please contact this office.

## 2017-12-24 NOTE — Progress Notes (Signed)
Cathy Robbins  MRN: [id number] DOB: 02/13/1956  PCP: Sebastian Ache, PA-C  Subjective:  Pt is a 62 year old presents to clinic for f/u dizziness.  This is her third office visit for this problem.  She is here today with her husband and speaks [place]  Mobile interpreter used today,  [id number]  She was here 6-18 complaining of dizziness.  At that time her blood sugar was 265 with an A1c of 9.3%.  She had been off of her diabetes medications for at least 2 weeks.  She ran out and did not get them refilled.  She was instructed to start back on metformin, however was back in the office on 6-21 complaining of diarrhea.  This was suspected to be secondary to metformin.  Advised titration of metformin extended release. She is taking Metformin XR 1/2 pill in the morning and 1/2 pill in the afternoon.  No medication SE.  Blood sugar yesterday at home was 139. Dizziness improved yesterday.  She is feeling well today.  Lab Results  Component Value Date   HGBA1C 9.3 (A) 12/16/2017    Review of Systems  Gastrointestinal: Negative for abdominal pain and diarrhea.  Neurological: Negative for dizziness, light-headedness and headaches.    Patient Active Problem List   Diagnosis Date Noted  . Type 2 diabetes mellitus without complication, without long-term current use of insulin (HCC) 11/20/2015  . Hyperlipidemia 11/20/2015    Current Outpatient Medications on File Prior to Visit  Medication Sig Dispense Refill  . Alcohol Swabs (ALCOHOL WIPES) 70 % PADS 1 application by Does not apply route 2 (two) times daily. 100 each 5  . atorvastatin (LIPITOR) 40 MG tablet Take 1 tablet (40 mg total) by mouth daily. 90 tablet 3  . Blood Glucose Calibration (CONTOUR NEXT CONTROL) Normal SOLN 1 drop by In Vitro route as needed. 1 each 2  . Garlic 705 MG CAPS Take 1 capsule (705 mg total) by mouth daily. 60 capsule 4  . gemfibrozil (LOPID) 600 MG tablet Take 1 tablet (600 mg total) by mouth 2 (two) times  daily before a meal. 60 tablet 4  . glimepiride (AMARYL) 4 MG tablet Take 1 tablet (4 mg total) by mouth daily. Take with breakfast or the first main meal. 60 tablet 4  . lisinopril (PRINIVIL,ZESTRIL) 10 MG tablet TAKE 1 TABLET(10 MG) BY MOUTH DAILY 60 tablet 6  . metFORMIN (GLUCOPHAGE-XR) 500 MG 24 hr tablet Week 1: take 1/2 pill twice a day; Week 2: take 1 pill in the morning, 1/2 pill at night; WUJW1: 1 pill in morning and 1 pill at night 90 tablet 5  . Omega-3 Fatty Acids (FISH OIL) 1000 MG CAPS Take by mouth.    . traMADol-acetaminophen (ULTRACET) 37.5-325 MG tablet Take 1 tablet by mouth every 6 (six) hours as needed. 30 tablet 0  . trolamine salicylate (ASPERCREME/ALOE) 10 % cream Apply 1 application topically as needed for muscle pain. 85 g 0  . ondansetron (ZOFRAN-ODT) 8 MG disintegrating tablet Take 1 tablet (8 mg total) by mouth every 8 (eight) hours as needed for nausea. (Patient not taking: Reported on 12/24/2017) 30 tablet 0   No current facility-administered medications on file prior to visit.     No Known Allergies   Objective:  BP 124/72 (BP Location: Left Arm, Patient Position: Sitting, Cuff Size: Normal)   Pulse 72   Temp 97.8 F (36.6 C) (Oral)   Resp 16   Ht 5\' 2"  (1.575 m)  Wt 111 lb 9.6 oz (50.6 kg)   SpO2 98%   BMI 20.41 kg/m   Physical Exam  Constitutional: She is oriented to person, place, and time. No distress.  Cardiovascular: Normal rate, regular rhythm and normal heart sounds.  Neurological: She is alert and oriented to person, place, and time.  Skin: Skin is warm and dry.  Psychiatric: Judgment normal.  Vitals reviewed.   Assessment and Plan :  1. Type 2 diabetes mellitus without complication, without long-term current use of insulin (HCC) -Patient presents for follow-up dizziness and diarrhea. She is asymptomatic today.  Suspect dizziness was due to elevated blood sugar, than diarrhea a side effect of metformin.  Advised patient to titrate  metformin extended release up slowly to 1 pill twice a day.  She is to continue taking Amaryl 4 mg with breakfast.  Discussed with patient importance of taking diabetes medications every day.  And to get refills if she runs out.  Return to clinic in 2 months to recheck A1c. - glimepiride (AMARYL) 4 MG tablet; Take 1 tablet (4 mg total) by mouth daily. Take with breakfast or the first main meal.  Dispense: 60 tablet; Refill: 4   Whitney Trevar Boehringer, PA-C  Primary [facility] at [facility] 12/24/2017 8:31 AM

## 2017-12-30 ENCOUNTER — Ambulatory Visit: Payer: BLUE CROSS/BLUE SHIELD | Admitting: Physician Assistant

## 2018-01-06 IMAGING — DX DG LUMBAR SPINE COMPLETE 4+V
5 series · 5 of 5 positions shown · non-contrast
Comparison: None.

CLINICAL DATA: Lumbago with left lower extremity weakness

EXAM:
LUMBAR SPINE - COMPLETE 4+ VIEW

[l-spine ap]
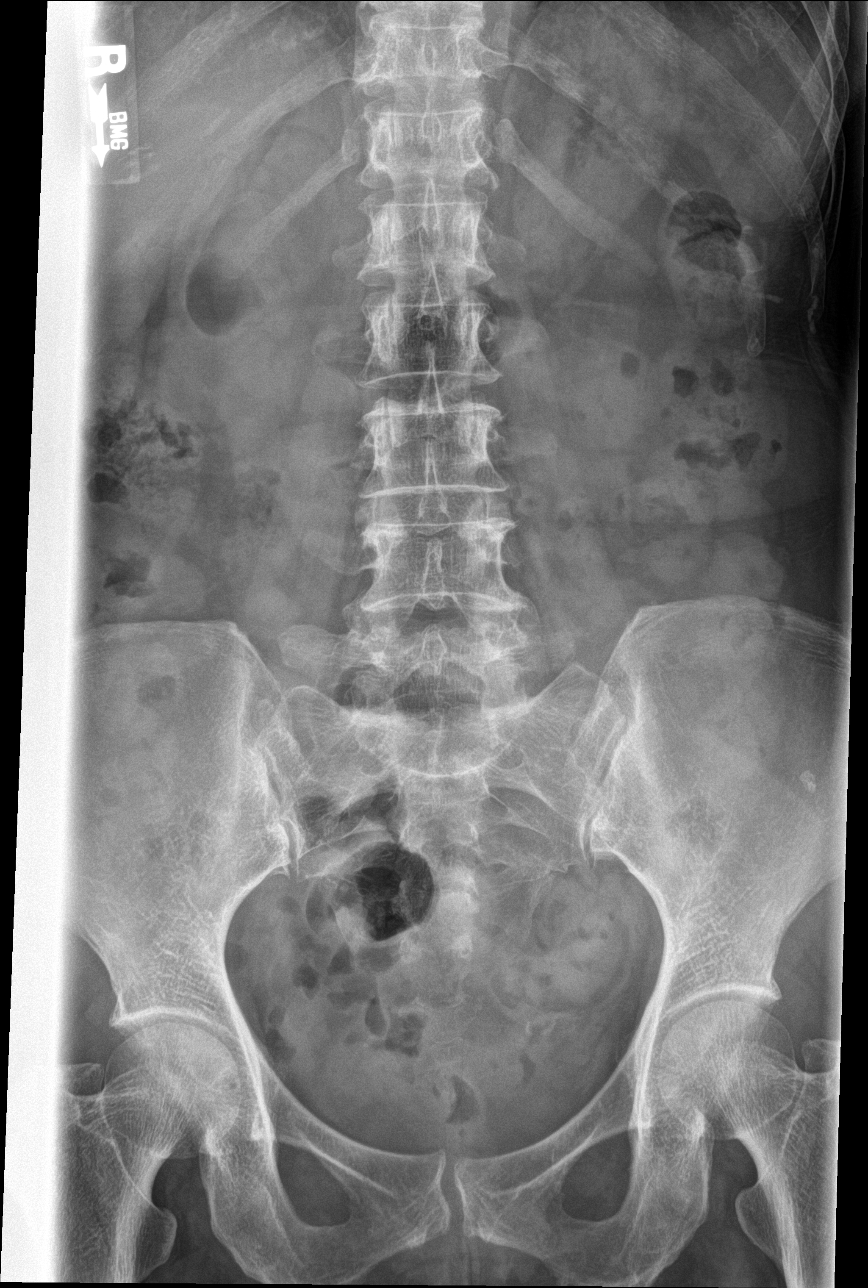

[l-spine obl (1 of 2)]
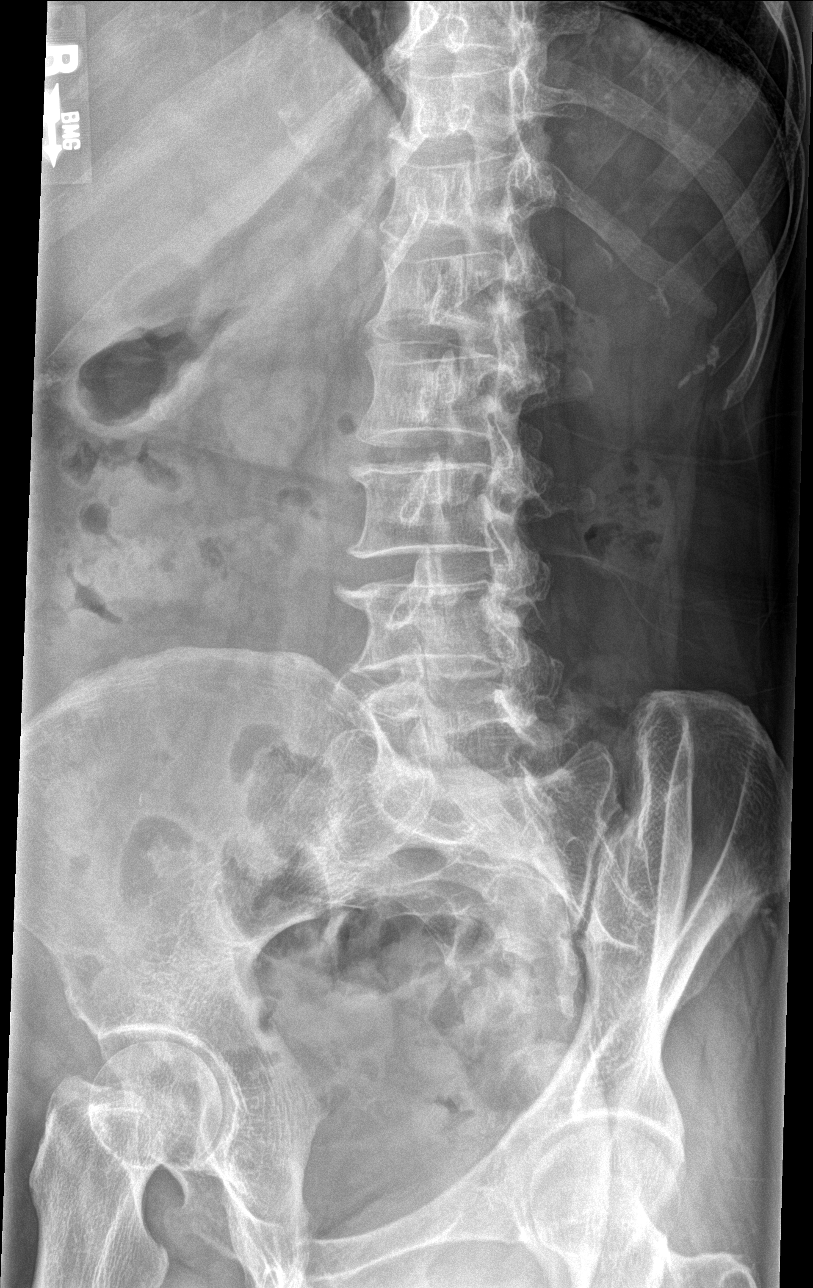

[l-spine obl (2 of 2)]
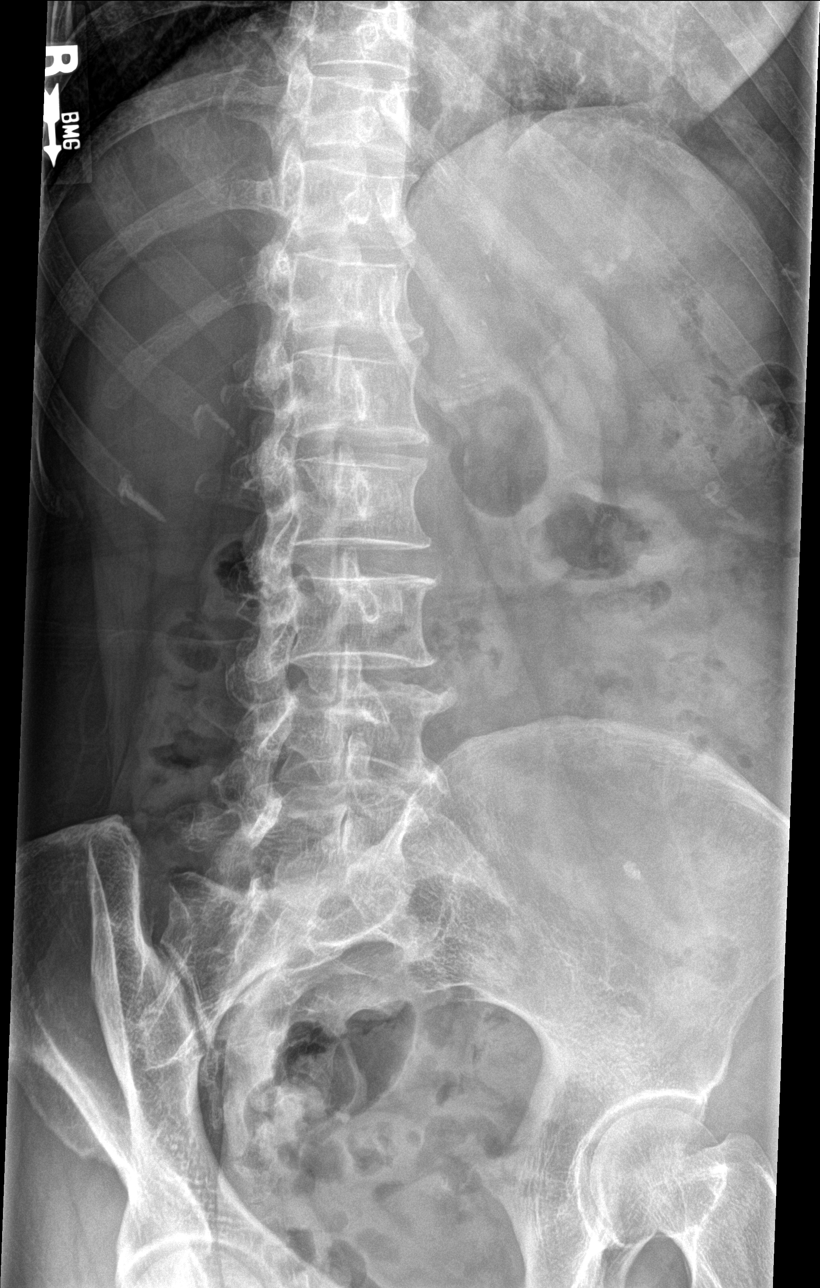

[l-spine lat]
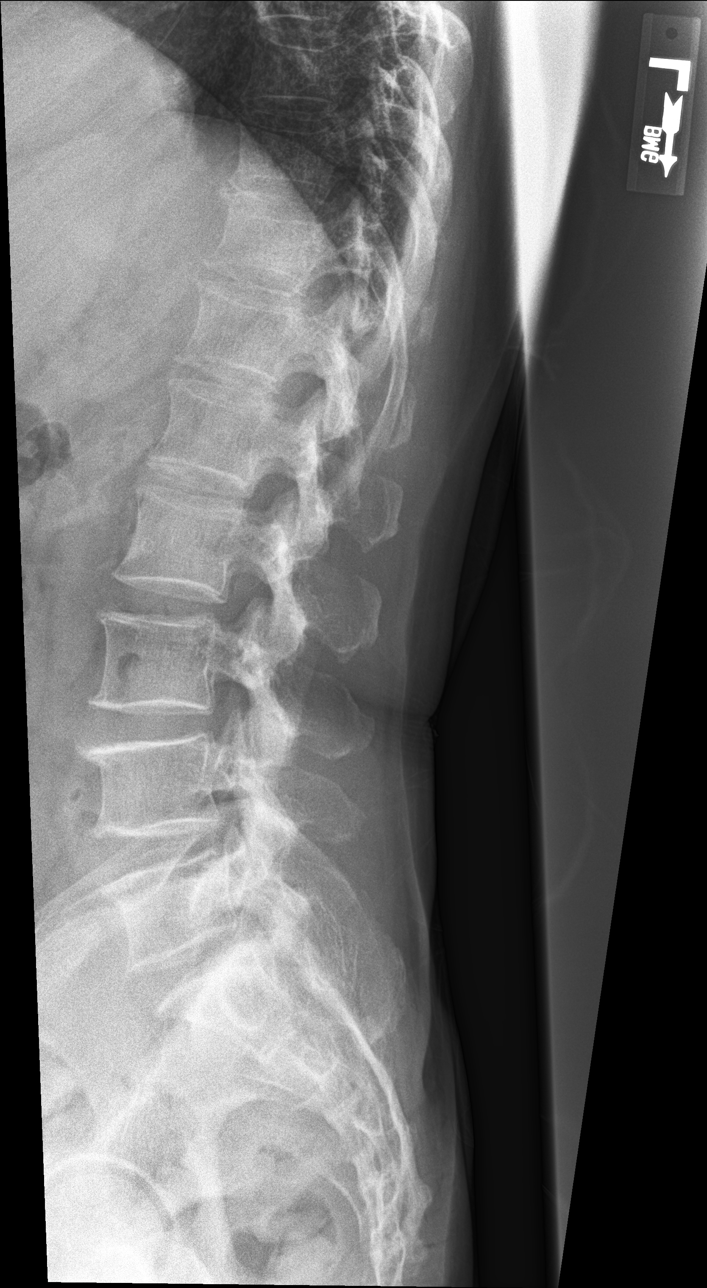

[l-spine l5-s1]
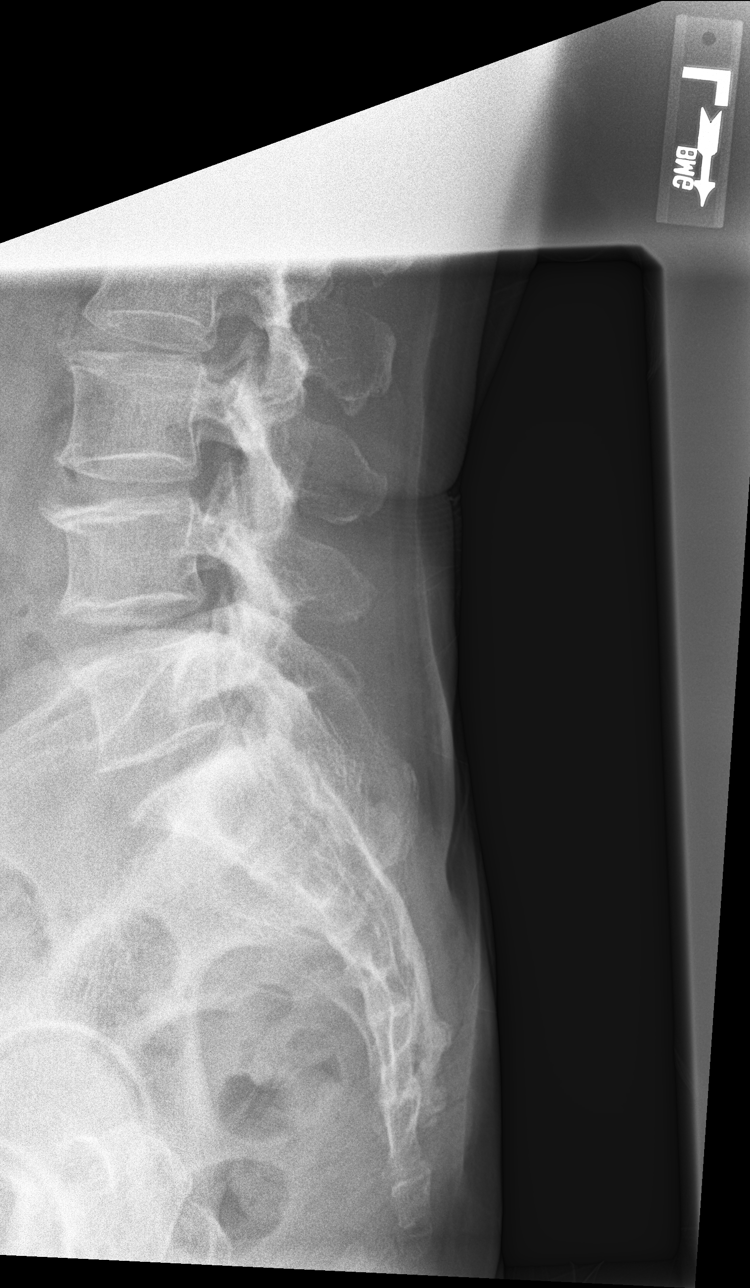

[5 of 5 positions shown; findings below may reference images not displayed]

FINDINGS: Frontal, lateral, spot lumbosacral lateral, and bilateral oblique
views were obtained. There are 5 non-rib-bearing lumbar type
vertebral bodies. There is no fracture or spondylolisthesis. There
are small anterior osteophytes at L3, L4, and L5. There is no
appreciable disc space narrowing. There is facet osteoarthritic
change at L3-4, L4-5, and L5-S1 bilaterally.
IMPRESSION: Facet osteoarthritic changes several levels. No appreciable disc
space narrowing. No fracture or spondylolisthesis.

## 2018-01-27 ENCOUNTER — Other Ambulatory Visit: Payer: Self-pay | Admitting: Physician Assistant

## 2018-01-27 DIAGNOSIS — I10 Essential (primary) hypertension: Secondary | ICD-10-CM

## 2018-01-27 DIAGNOSIS — E782 Mixed hyperlipidemia: Secondary | ICD-10-CM

## 2018-01-28 ENCOUNTER — Other Ambulatory Visit: Payer: Self-pay | Admitting: *Deleted

## 2018-01-28 ENCOUNTER — Encounter: Payer: Self-pay | Admitting: Physician Assistant

## 2018-01-28 DIAGNOSIS — E782 Mixed hyperlipidemia: Secondary | ICD-10-CM

## 2018-01-28 MED ORDER — ATORVASTATIN CALCIUM 40 MG PO TABS: 40.0000 mg | ORAL_TABLET | Freq: Every day | ORAL | 0 refills | Status: DC

## 2018-01-28 NOTE — Telephone Encounter (Signed)
Regarding Atorvastatin 40 MG refill for 90 day supply-please disregard. Ordered 30 day supply- due to patient is overdue for lipid panel.  Next OV 02/24/18.

## 2018-01-28 NOTE — Telephone Encounter (Signed)
LOV 12/24/17 Next OV 02/24/18 PCP Whitney McVey, PA Refills for Lisinopril 10 MG tab Atorvastatin 40 MG tab Approved, sent to [facility] on file.

## 2018-02-24 ENCOUNTER — Ambulatory Visit: Payer: BLUE CROSS/BLUE SHIELD | Admitting: Physician Assistant

## 2018-03-23 ENCOUNTER — Other Ambulatory Visit: Payer: Self-pay

## 2018-03-23 DIAGNOSIS — E782 Mixed hyperlipidemia: Secondary | ICD-10-CM

## 2018-03-23 MED ORDER — GEMFIBROZIL 600 MG PO TABS: 600.0000 mg | ORAL_TABLET | Freq: Two times a day (BID) | ORAL | 0 refills | Status: DC

## 2018-04-15 ENCOUNTER — Other Ambulatory Visit: Payer: Self-pay | Admitting: Physician Assistant

## 2018-04-15 DIAGNOSIS — E119 Type 2 diabetes mellitus without complications: Secondary | ICD-10-CM

## 2018-04-15 DIAGNOSIS — E785 Hyperlipidemia, unspecified: Secondary | ICD-10-CM

## 2018-04-19 ENCOUNTER — Other Ambulatory Visit: Payer: Self-pay | Admitting: Physician Assistant

## 2018-04-19 DIAGNOSIS — E782 Mixed hyperlipidemia: Secondary | ICD-10-CM

## 2018-04-20 ENCOUNTER — Other Ambulatory Visit: Payer: Self-pay | Admitting: Emergency Medicine

## 2018-04-20 NOTE — Telephone Encounter (Signed)
Requested medication (s) are due for refill today: yes  Requested medication (s) are on the active medication list: yes  Last refill:  03/23/18  Future visit scheduled: no  Notes to clinic:  [place]  12/24/17. Total Cholesterol, LDL, HDL,and triglycerides, all abnormal but done within the year.  Requested Prescriptions  Pending Prescriptions Disp Refills   gemfibrozil (LOPID) 600 MG tablet [Pharmacy Med Name: GEMFIBROZIL 600MG TABLETS] 60 tablet 0    Sig: TAKE 1 TABLET(600 MG) BY MOUTH TWICE DAILY BEFORE A MEAL     Cardiovascular:  Antilipid - Fibric Acid Derivatives Failed - 04/19/2018  3:35 AM      Failed - Total Cholesterol in normal range and within 360 days    Cholesterol, Total  Date Value Ref Range Status  04/23/2017 163 100 - 199 mg/dL Final         Failed - LDL in normal range and within 360 days    LDL Calculated  Date Value Ref Range Status  04/23/2017 Comment 0 - 99 mg/dL Final    Comment:    Triglyceride result indicated is too high for an accurate LDL cholesterol estimation.          Failed - HDL in normal range and within 360 days    HDL  Date Value Ref Range Status  04/23/2017 32 (L) >39 mg/dL Final         Failed - Triglycerides in normal range and within 360 days    Triglycerides  Date Value Ref Range Status  04/23/2017 407 (H) 0 - 149 mg/dL Final         Passed - ALT in normal range and within 180 days    ALT  Date Value Ref Range Status  12/16/2017 13 0 - 32 IU/L Final         Passed - AST in normal range and within 180 days    AST  Date Value Ref Range Status  12/16/2017 18 0 - 40 IU/L Final         Passed - Cr in normal range and within 180 days    Creat  Date Value Ref Range Status  05/17/2016 0.71 0.50 - 0.99 mg/dL Final    Comment:      For patients > or = 64 years of age: The upper reference limit for Creatinine is approximately 13% higher for people identified as African-American.      Creatinine, Ser  Date Value Ref Range  Status  12/16/2017 0.83 0.57 - 1.00 mg/dL Final         Passed - eGFR in normal range and within 180 days    GFR, Est African American  Date Value Ref Range Status  05/17/2016 >89 >=60 mL/min Final   GFR calc Af Amer  Date Value Ref Range Status  12/16/2017 87 >59 mL/min/1.73 Final   GFR, Est Non African American  Date Value Ref Range Status  05/17/2016 >89 >=60 mL/min Final   GFR calc non Af Amer  Date Value Ref Range Status  12/16/2017 75 >59 mL/min/1.73 Final         Passed - Valid encounter within last 12 months    Recent Outpatient Visits          3 months ago Type 2 diabetes mellitus without complication, without long-term current use of insulin [facility])   Primary [facility] at [place], PA-C   4 months ago Dizziness   Primary Care at [facility], [place], [place]  4 months ago Dizziness   Primary Care at [facility], Gelene Mink, PA-C   9 months ago Diarrhea of presumed infectious origin   Primary Care at [place], PA-C   12 months ago Type 2 diabetes mellitus without complication, without long-term current use of insulin [facility])   Primary Care at [facility], Gelene Mink, [place]

## 2018-04-30 ENCOUNTER — Other Ambulatory Visit: Payer: Self-pay | Admitting: Physician Assistant

## 2018-04-30 DIAGNOSIS — E782 Mixed hyperlipidemia: Secondary | ICD-10-CM

## 2018-04-30 DIAGNOSIS — I10 Essential (primary) hypertension: Secondary | ICD-10-CM

## 2018-04-30 MED ORDER — ATORVASTATIN CALCIUM 40 MG PO TABS: 40.0000 mg | ORAL_TABLET | Freq: Every day | ORAL | 0 refills | Status: DC

## 2018-04-30 NOTE — Telephone Encounter (Signed)
Requested medication (s) are due for refill today:   Yes   Lisinopril & Lipitor  Requested medication (s) are on the active medication list:   Yes  Future visit scheduled:   No.  Was a no show on 02/24/18.   LOV 12/24/17   Last ordered: 01/28/18  #30  0 refills   Needs OV for refills  Courtesy refills have been given.   Requested Prescriptions  Pending Prescriptions Disp Refills   lisinopril (PRINIVIL,ZESTRIL) 10 MG tablet [Pharmacy Med Name: LISINOPRIL 10MG  TABLETS] 60 tablet 0    Sig: TAKE 1 TABLET(10 MG) BY MOUTH DAILY     Cardiovascular:  ACE Inhibitors Passed - 04/30/2018  3:36 AM      Passed - Cr in normal range and within 180 days    Creat  Date Value Ref Range Status  05/17/2016 0.71 0.50 - 0.99 mg/dL Final    Comment:      For patients > or = 64 years of age: The upper reference limit for Creatinine is approximately 13% higher for people identified as African-American.      Creatinine, Ser  Date Value Ref Range Status  12/16/2017 0.83 0.57 - 1.00 mg/dL Final         Passed - K in normal range and within 180 days    Potassium  Date Value Ref Range Status  12/16/2017 4.9 3.5 - 5.2 mmol/L Final         Passed - Patient is not pregnant      Passed - Last BP in normal range    BP Readings from Last 1 Encounters:  12/24/17 124/72         Passed - Valid encounter within last 6 months    Recent Outpatient Visits          4 months ago Type 2 diabetes mellitus without complication, without long-term current use of insulin (HCC)   Primary Care at [facility], Madelaine Bhat, PA-C   4 months ago Dizziness   Primary Care at [employer], Madelaine Bhat, PA-C   4 months ago Dizziness   Primary Care at [employer], [place], PA-C   9 months ago Diarrhea of presumed infectious origin   Primary Care at [facility], Marolyn Hammock, PA-C   1 year ago Type 2 diabetes mellitus without complication, without long-term current use of insulin (HCC)   Primary Care  at [facility], Madelaine Bhat, PA-C            atorvastatin (LIPITOR) 40 MG tablet [Pharmacy Med Name: ATORVASTATIN 40MG  TABLETS] 90 tablet 0    Sig: TAKE 1 TABLET(40 MG) BY MOUTH DAILY     Cardiovascular:  Antilipid - Statins Failed - 04/30/2018  3:36 AM      Failed - Total Cholesterol in normal range and within 360 days    Cholesterol, Total  Date Value Ref Range Status  04/23/2017 163 100 - 199 mg/dL Final         Failed - LDL in normal range and within 360 days    LDL Calculated  Date Value Ref Range Status  04/23/2017 Comment 0 - 99 mg/dL Final    Comment:    Triglyceride result indicated is too high for an accurate LDL cholesterol estimation.          Failed - HDL in normal range and within 360 days    HDL  Date Value Ref Range Status  04/23/2017 32 (L) >39 mg/dL Final  Failed - Triglycerides in normal range and within 360 days    Triglycerides  Date Value Ref Range Status  04/23/2017 407 (H) 0 - 149 mg/dL Final         Passed - Patient is not pregnant      Passed - Valid encounter within last 12 months    Recent Outpatient Visits          4 months ago Type 2 diabetes mellitus without complication, without long-term current use of insulin [facility])   Primary Care at [facility], Madelaine Bhat, PA-C   4 months ago Dizziness   Primary Care at [facility], [place], PA-C   4 months ago Dizziness   Primary Care at [facility], [place], PA-C   9 months ago Diarrhea of presumed infectious origin   Primary Care at [facility], Marolyn Hammock, PA-C   1 year ago Type 2 diabetes mellitus without complication, without long-term current use of insulin [facility])   Primary Care at [facility], Madelaine Bhat, [place]

## 2018-04-30 NOTE — Telephone Encounter (Signed)
Given 30 days - overdue for lipid panel

## 2018-05-12 ENCOUNTER — Encounter: Payer: Self-pay | Admitting: Physician Assistant

## 2018-05-12 ENCOUNTER — Other Ambulatory Visit: Payer: Self-pay

## 2018-05-12 ENCOUNTER — Ambulatory Visit: Payer: BLUE CROSS/BLUE SHIELD | Admitting: Physician Assistant

## 2018-05-12 VITALS — BP 146/72 | HR 73 | Temp 98.3°F | Resp 16 | Ht 61.0 in | Wt 112.4 lb

## 2018-05-12 DIAGNOSIS — Z1211 Encounter for screening for malignant neoplasm of colon: Secondary | ICD-10-CM

## 2018-05-12 DIAGNOSIS — E782 Mixed hyperlipidemia: Secondary | ICD-10-CM

## 2018-05-12 DIAGNOSIS — E785 Hyperlipidemia, unspecified: Secondary | ICD-10-CM | POA: Diagnosis not present

## 2018-05-12 DIAGNOSIS — E119 Type 2 diabetes mellitus without complications: Secondary | ICD-10-CM | POA: Diagnosis not present

## 2018-05-12 DIAGNOSIS — I10 Essential (primary) hypertension: Secondary | ICD-10-CM

## 2018-05-12 DIAGNOSIS — R030 Elevated blood-pressure reading, without diagnosis of hypertension: Secondary | ICD-10-CM | POA: Diagnosis not present

## 2018-05-12 LAB — POCT GLYCOSYLATED HEMOGLOBIN (HGB A1C): Hemoglobin A1C: 7.4 % — AB (ref 4.0–5.6)

## 2018-05-12 MED ORDER — METFORMIN HCL ER 750 MG PO TB24: 750.0000 mg | ORAL_TABLET | Freq: Two times a day (BID) | ORAL | 3 refills | Status: DC

## 2018-05-12 MED ORDER — GEMFIBROZIL 600 MG PO TABS: ORAL_TABLET | ORAL | 5 refills | Status: DC

## 2018-05-12 NOTE — Patient Instructions (Addendum)
Chc m?ng b?n ? u?ng thu?c m?i ngy v khng h?t! Hy ti?p t?c pht huy. Ti ?ang t?ng li?u metformin c?a b?n t? 580m ln 7582m Dng hai l?n m?i ngy v?i b?a ?n. B?n s? nh?n ???c cologuard trong th?.  Ti s? g?i cho b?n m?t l th? trong th? v?i k?t qu? phng th nghi?m c?a b?n v m?t k? ho?ch ?? quay l?i v g?p ti.  ---------------------------------------------------------------------------------------------------------- Congratulations on taking your medications every day and not running out! Keep up the good work.   I am increasing your metformin dose from 50061mo 750m28make this twice daily with meals.   I will send you a letter in the mail with your lab results and a plan to come back and see me.  You will receive cologuard in the mail.    Thank you for coming in today. I hope you feel we met your needs.  Feel free to call PCP if you have any questions or further requests.  Please consider signing up for MyChart if you do not already have it, as this is a great way to communicate with me.  Best,  [employer]-C

## 2018-05-12 NOTE — Progress Notes (Signed)
Cathy Robbins  MRN: [id number] DOB: 30-May-1956  PCP: Dorise Hiss, PA-C  Subjective:  Pt is a 62 year old female who presents to clinic for medication refill of Gemfibrozil Lopid 600 mg. She is here today with her husband and who speaks [place]. Interpreter [phone]  HTN- lisinopril 10 mg qd. Blood pressure today is 150/76, recheck is 146/72. She does not take home blood pressure readings. Denies lightheadedness, dizziness, chronic headache, double vision, chest pain, shortness of breath, heart racing, palpitations, nausea, vomiting, abdominal pain, hematuria, lower leg swelling.  DM - Amaryl 4 mg with breakfast and Metformin XR 546m bid. Home blood sugars are about 108-121. She endorses medication compliance.  Lab Results  Component Value Date   HGBA1C 9.3 (A) 063/33/5456  HLD - Garlic supplement 7256mg, gemfibrozil 600 mg twice daily, Atorvastatin 450m Lab Results  Component Value Date   CHOL 163 04/23/2017   CHOL 172 03/04/2017   CHOL 272 (H) 01/21/2017   Lab Results  Component Value Date   HDL 32 (L) 04/23/2017   HDL 38 (L) 03/04/2017   HDL 28 (L) 01/21/2017   Lab Results  Component Value Date   LDLCALC Comment 04/23/2017   LDLCALC 77 03/04/2017   LDMidwayomment 01/21/2017   Lab Results  Component Value Date   TRIG 407 (H) 04/23/2017   TRIG 283 (H) 03/04/2017   TRIG 1,206 (HHRed Lodge07/24/2018   Lab Results  Component Value Date   CHOLHDL 5.1 (H) 04/23/2017   CHOLHDL 4.5 (H) 03/04/2017   CHOLHDL 9.7 (H) 01/21/2017   No results found for: LDLDIRECT  She has multiple care gaps including MM or colonoscopy.   Review of Systems  Patient Active Problem List   Diagnosis Date Noted  . Type 2 diabetes mellitus without complication, without long-term current use of insulin ([facility]/22/2017  . Hyperlipidemia 11/20/2015    Current Outpatient Medications on File Prior to Visit  Medication Sig Dispense Refill  . Alcohol Swabs (ALCOHOL WIPES) 70 % PADS 1  application by Does not apply route 2 (two) times daily. 100 each 5  . atorvastatin (LIPITOR) 40 MG tablet Take 1 tablet (40 mg total) by mouth daily at 6 PM. 30 day supply given due to overdue for lipid panel. 30 tablet 0  . Blood Glucose Calibration (CONTOUR NEXT CONTROL) Normal SOLN 1 drop by In Vitro route as needed. 1 each 2  . Garlic 70389G CAPS Take 1 capsule (705 mg total) by mouth daily. 60 capsule 4  . gemfibrozil (LOPID) 600 MG tablet TAKE 1 TABLET(600 MG) BY MOUTH TWICE DAILY BEFORE A MEAL 60 tablet 0  . glimepiride (AMARYL) 2 MG tablet TAKE 1 TABLET BY MOUTH EVERY DAY 30 tablet 0  . glimepiride (AMARYL) 4 MG tablet Take 1 tablet (4 mg total) by mouth daily. Take with breakfast or the first main meal. 60 tablet 4  . lisinopril (PRINIVIL,ZESTRIL) 10 MG tablet TAKE 1 TABLET(10 MG) BY MOUTH DAILY 60 tablet 0  . metFORMIN (GLUCOPHAGE) 500 MG tablet TAKE 2 TABLETS BY MOUTH TWICE DAILY 120 tablet 0  . metFORMIN (GLUCOPHAGE-XR) 500 MG 24 hr tablet Week 1: take 1/2 pill twice a day; Week 2: take 1 pill in the morning, 1/2 pill at night; WeHTDS21 pill in morning and 1 pill at night 90 tablet 5  . Omega-3 Fatty Acids (FISH OIL) 1000 MG CAPS Take by mouth.    . traMADol-acetaminophen (ULTRACET) 37.5-325 MG tablet Take 1 tablet by mouth  every 6 (six) hours as needed. 30 tablet 0  . trolamine salicylate (ASPERCREME/ALOE) 10 % cream Apply 1 application topically as needed for muscle pain. 85 g 0  . ondansetron (ZOFRAN-ODT) 8 MG disintegrating tablet Take 1 tablet (8 mg total) by mouth every 8 (eight) hours as needed for nausea. (Patient not taking: Reported on 05/12/2018) 30 tablet 0   No current facility-administered medications on file prior to visit.     No Known Allergies   Objective:  BP (!) 150/76 (BP Location: Left Arm, Patient Position: Sitting, Cuff Size: Normal)   Pulse 73   Temp 98.3 F (36.8 C) (Oral)   Resp 16   Ht '5\' 1"'  (1.549 m)   Wt 112 lb 6.4 oz (51 kg)   SpO2 98%   BMI  21.24 kg/m   Physical Exam  Constitutional: She appears well-developed and well-nourished.  Neck: Carotid bruit is not present.  Cardiovascular: Normal rate and regular rhythm.  Musculoskeletal:       Right ankle: She exhibits no swelling.       Left ankle: She exhibits no swelling.       Right lower leg: She exhibits no edema.       Left lower leg: She exhibits no edema.  Vitals reviewed.  Results for orders placed or performed in visit on 05/12/18  POCT glycosylated hemoglobin (Hb A1C)  Result Value Ref Range   Hemoglobin A1C 7.4 (A) 4.0 - 5.6 %   HbA1c POC (<> result, manual entry)     HbA1c, POC (prediabetic range)     HbA1c, POC (controlled diabetic range)      Assessment and Plan :  1. Hyperlipidemia, unspecified hyperlipidemia type 2. Elevated triglycerides with high cholesterol - Plan to refill medication. Will recheck lipid panel and contact with results.  - Lipid panel - gemfibrozil (LOPID) 600 MG tablet; TAKE 1 TABLET(600 MG) BY MOUTH TWICE DAILY BEFORE A MEAL  Dispense: 180 tablet; Refill: 5  3. Type 2 diabetes mellitus without complication, without long-term current use of insulin (HCC) - A1C improved from 9.3 to 7.4%. Will increase metformin XR from 518m bid to 7547mbid.  Will await labs to let pt know when to come back for recheck.  - CMP14+EGFR - POCT glycosylated hemoglobin (Hb A1C) - metFORMIN (GLUCOPHAGE-XR) 750 MG 24 hr tablet; Take 1 tablet (750 mg total) by mouth 2 (two) times daily with a meal.  Dispense: 90 tablet; Refill: 3  4. Elevated blood pressure reading - elevated blood pressure reading. She has had blood pressures wnl her past several visits. Will con't to watch. Consider adding medication or increasing dose if needed.  - Recheck vitals  5. Essential hypertension  6. Screen for colon cancer - Cologuard - information discussed with pt by lab tech.    WhMercer PodPA-C  [facility]/05/2018 5:08  PM  Please note: Portions of this report may have been transcribed using dragon voice recognition software. Every effort was made to ensure accuracy; however, inadvertent computerized transcription errors may be present.

## 2018-05-13 LAB — LIPID PANEL
Chol/HDL Ratio: 8.6 ratio — ABNORMAL HIGH (ref 0.0–4.4)
Cholesterol, Total: 242 mg/dL — ABNORMAL HIGH (ref 100–199)
HDL: 28 mg/dL — ABNORMAL LOW (ref >39)
Triglycerides: 925 mg/dL (ref 0–149)

## 2018-05-13 LAB — CMP14+EGFR
ALT: 14 IU/L (ref 0–32)
AST: 15 IU/L (ref 0–40)
Albumin/Globulin Ratio: 1.2 (ref 1.2–2.2)
Albumin: 4.2 g/dL (ref 3.6–4.8)
Alkaline Phosphatase: 71 IU/L (ref 39–117)
BUN/Creatinine Ratio: 14 (ref 12–28)
BUN: 10 mg/dL (ref 8–27)
Bilirubin Total: 0.3 mg/dL (ref 0.0–1.2)
CO2: 22 mmol/L (ref 20–29)
Calcium: 9.6 mg/dL (ref 8.7–10.3)
Chloride: 101 mmol/L (ref 96–106)
Creatinine, Ser: 0.74 mg/dL (ref 0.57–1.00)
GFR calc Af Amer: 99 mL/min/{1.73_m2} (ref >59)
GFR calc non Af Amer: 86 mL/min/{1.73_m2} (ref >59)
Globulin, Total: 3.4 g/dL (ref 1.5–4.5)
Glucose: 142 mg/dL — ABNORMAL HIGH (ref 65–99)
Potassium: 4 mmol/L (ref 3.5–5.2)
Sodium: 138 mmol/L (ref 134–144)
Total Protein: 7.6 g/dL (ref 6.0–8.5)

## 2018-05-22 ENCOUNTER — Encounter: Payer: Self-pay | Admitting: Physician Assistant

## 2018-05-22 NOTE — Progress Notes (Signed)
Result letter sent in mail. RTC in 3 months to recheck DM and HLD. Advised pt to be fasting.

## 2018-09-16 ENCOUNTER — Other Ambulatory Visit: Payer: Self-pay

## 2018-09-16 DIAGNOSIS — I10 Essential (primary) hypertension: Secondary | ICD-10-CM

## 2018-09-16 MED ORDER — LISINOPRIL 10 MG PO TABS: 10.0000 mg | ORAL_TABLET | Freq: Every day | ORAL | 0 refills | Status: DC

## 2018-09-16 NOTE — Telephone Encounter (Signed)
I have refilled med

## 2018-10-02 ENCOUNTER — Ambulatory Visit: Payer: BLUE CROSS/BLUE SHIELD | Admitting: Family Medicine

## 2018-11-06 ENCOUNTER — Other Ambulatory Visit: Payer: Self-pay

## 2018-11-06 DIAGNOSIS — I10 Essential (primary) hypertension: Secondary | ICD-10-CM

## 2018-11-06 DIAGNOSIS — E119 Type 2 diabetes mellitus without complications: Secondary | ICD-10-CM

## 2018-11-12 ENCOUNTER — Other Ambulatory Visit: Payer: Self-pay | Admitting: Family Medicine

## 2018-11-12 DIAGNOSIS — I10 Essential (primary) hypertension: Secondary | ICD-10-CM

## 2018-11-13 ENCOUNTER — Telehealth: Payer: Self-pay | Admitting: Family Medicine

## 2018-11-13 NOTE — Telephone Encounter (Signed)
Called pt daughter per DPR  LVM for pt to call back   And verify insurance if [facility] local or home we are out of netk.If these abnormal clinical findings persist, appropriate workup will be completed. The patient understands that follow up is required to elucidate the situation. New insurance we need to get that on file and convert her upcoming appt to a Tele-med

## 2018-11-16 ENCOUNTER — Ambulatory Visit: Payer: BLUE CROSS/BLUE SHIELD | Admitting: Family Medicine

## 2018-11-16 ENCOUNTER — Other Ambulatory Visit: Payer: Self-pay

## 2018-12-07 ENCOUNTER — Other Ambulatory Visit: Payer: Self-pay | Admitting: Physician Assistant

## 2018-12-07 NOTE — Telephone Encounter (Signed)
Patient called, left Vm to return call to [place] to schedule an OV.

## 2018-12-23 ENCOUNTER — Other Ambulatory Visit: Payer: Self-pay

## 2018-12-23 DIAGNOSIS — E119 Type 2 diabetes mellitus without complications: Secondary | ICD-10-CM

## 2018-12-23 MED ORDER — GLIMEPIRIDE 4 MG PO TABS: 4.0000 mg | ORAL_TABLET | Freq: Every day | ORAL | 0 refills | Status: DC

## 2019-01-04 ENCOUNTER — Encounter: Payer: Medicare Other | Admitting: Registered Nurse

## 2019-01-12 ENCOUNTER — Other Ambulatory Visit: Payer: Self-pay

## 2019-01-12 ENCOUNTER — Ambulatory Visit (INDEPENDENT_AMBULATORY_CARE_PROVIDER_SITE_OTHER): Payer: Medicare Other | Admitting: Registered Nurse

## 2019-01-12 ENCOUNTER — Encounter: Payer: Self-pay | Admitting: Registered Nurse

## 2019-01-12 DIAGNOSIS — R112 Nausea with vomiting, unspecified: Secondary | ICD-10-CM

## 2019-01-12 DIAGNOSIS — E119 Type 2 diabetes mellitus without complications: Secondary | ICD-10-CM | POA: Diagnosis not present

## 2019-01-12 DIAGNOSIS — M25562 Pain in left knee: Secondary | ICD-10-CM

## 2019-01-12 DIAGNOSIS — I10 Essential (primary) hypertension: Secondary | ICD-10-CM | POA: Diagnosis not present

## 2019-01-12 DIAGNOSIS — E782 Mixed hyperlipidemia: Secondary | ICD-10-CM

## 2019-01-12 LAB — GLUCOSE, POCT (MANUAL RESULT ENTRY): POC Glucose: 164 mg/dl — AB (ref 70–99)

## 2019-01-12 MED ORDER — LISINOPRIL 10 MG PO TABS: ORAL_TABLET | ORAL | 1 refills | Status: DC

## 2019-01-12 MED ORDER — ATORVASTATIN CALCIUM 40 MG PO TABS: 40.0000 mg | ORAL_TABLET | Freq: Every day | ORAL | 1 refills | Status: DC

## 2019-01-12 MED ORDER — GLIMEPIRIDE 4 MG PO TABS: 4.0000 mg | ORAL_TABLET | Freq: Every day | ORAL | 1 refills | Status: DC

## 2019-01-12 MED ORDER — METFORMIN HCL ER 750 MG PO TB24: 750.0000 mg | ORAL_TABLET | Freq: Two times a day (BID) | ORAL | 1 refills | Status: DC

## 2019-01-12 MED ORDER — GEMFIBROZIL 600 MG PO TABS: 600.0000 mg | ORAL_TABLET | Freq: Two times a day (BID) | ORAL | 1 refills | Status: DC

## 2019-01-12 MED ORDER — ONDANSETRON 8 MG PO TBDP: 8.0000 mg | ORAL_TABLET | Freq: Three times a day (TID) | ORAL | 0 refills | Status: DC | PRN

## 2019-01-12 NOTE — Patient Instructions (Signed)
° ° ° °  If you have lab work done today you will be contacted with your lab results within the next 2 weeks.  If you have not heard from us then please contact us. The fastest way to get your results is to register for My Chart. ° ° °IF you received an x-ray today, you will receive an invoice from [facility]. Please contact [facility] at [phone] with questions or concerns regarding your invoice.  ° °IF you received labwork today, you will receive an invoice from [facility]. Please contact [facility] at [phone] with questions or concerns regarding your invoice.  ° °Our billing staff will not be able to assist you with questions regarding bills from these companies. ° °You will be contacted with the lab results as soon as they are available. The fastest way to get your results is to activate your My Chart account. Instructions are located on the last page of this paperwork. If you have not heard from us regarding the results in 2 weeks, please contact this office. °  ° ° ° °

## 2019-01-12 NOTE — Progress Notes (Signed)
Established Patient Office Visit  Subjective:  Patient ID: Cathy Robbins, female    DOB: Sep 14, 1955  Age: 63 y.o. MRN: [id number]  CC:  Chief Complaint  Patient presents with  . Transfer of Care    pt need new pcp to manage her Chronic Conditions and medications   . Medication Refill    all medications     HPI [name] presents for visit to establish care and medication refills.  History significant for HTN, HLD, and T2DM. Here today with husband who will help interpret.  HTN: Taking lisinopril 10mg  PO qd for HTN with good effect. BP wnl today, will continue to monitor  HLD: Taking Atorvastatin 40mg  PO qd and gemfibrozil 600mg  PO bid. Will check lipids today. Reports overall healthy diet. States that she eats mostly vegetables.  T2DM: Taking Metformin XR 750mg  PO qd and glimeperide 4mg  PO bid ac. States that she checks her sugars 1-2 times per week. Highest reading since last visit was 154. Today she is not fasting, sugar is 164. Otherwise, usually runs in 130s. Occasionally, she feels lightheaded and has hypoglycemia. Suggested having snacks on hand to raise sugar if needed.  Otherwise, no complaints. States she is active, biking on a stationary bike around 30 minutes each day.   Past Medical History:  Diagnosis Date  . Diabetes mellitus without complication (HCC)     History reviewed. No pertinent surgical history.  History reviewed. No pertinent family history.  Social History   Socioeconomic History  . Marital status: Married    Spouse name: Not on file  . Number of children: 4  . Years of education: Not on file  . Highest education level: Not on file  Occupational History  . Not on file  Social Needs  . Financial resource strain: Not hard at all  . Food insecurity    Worry: Never true    Inability: Never true  . Transportation needs    Medical: No    Non-medical: No  Tobacco Use  . Smoking status: Never Smoker  . Smokeless tobacco: Never Used  Substance and  Sexual Activity  . Alcohol use: No  . Drug use: No  . Sexual activity: Not Currently  Lifestyle  . Physical activity    Days per week: 5 days    Minutes per session: 30 min  . Stress: Not at all  Relationships  . Social Musicianconnections    Talks on phone: Twice a week    Gets together: Once a week    Attends religious service: Never    Active member of club or organization: No    Attends meetings of clubs or organizations: Never    Relationship status: Married  . Intimate partner violence    Fear of current or ex partner: No    Emotionally abused: No    Physically abused: No    Forced sexual activity: No  Other Topics Concern  . Not on file  Social History Narrative  . Not on file    Outpatient Medications Prior to Visit  Medication Sig Dispense Refill  . Alcohol Swabs (ALCOHOL WIPES) 70 % PADS 1 application by Does not apply route 2 (two) times daily. 100 each 5  . Blood Glucose Calibration (CONTOUR NEXT CONTROL) Normal SOLN 1 drop by In Vitro route as needed. 1 each 2  . Garlic 705 MG CAPS Take 1 capsule (705 mg total) by mouth daily. 60 capsule 4  . Omega-3 Fatty Acids (FISH OIL) 1000 MG CAPS  Take by mouth.    . traMADol-acetaminophen (ULTRACET) 37.5-325 MG tablet Take 1 tablet by mouth every 6 (six) hours as needed. 30 tablet 0  . trolamine salicylate (ASPERCREME/ALOE) 10 % cream Apply 1 application topically as needed for muscle pain. 85 g 0  . atorvastatin (LIPITOR) 40 MG tablet Take 1 tablet (40 mg total) by mouth daily at 6 PM. 30 day supply given due to overdue for lipid panel. 30 tablet 0  . gemfibrozil (LOPID) 600 MG tablet TAKE 1 TABLET(600 MG) BY MOUTH TWICE DAILY BEFORE A MEAL 180 tablet 5  . glimepiride (AMARYL) 4 MG tablet Take 1 tablet (4 mg total) by mouth daily. Take with breakfast or the first main meal. 30 tablet 0  . lisinopril (ZESTRIL) 10 MG tablet TAKE 1 TABLET(10 MG) BY MOUTH DAILY 60 tablet 0  . metFORMIN (GLUCOPHAGE-XR) 750 MG 24 hr tablet Take 1 tablet  (750 mg total) by mouth 2 (two) times daily with a meal. 90 tablet 3  . ondansetron (ZOFRAN-ODT) 8 MG disintegrating tablet Take 1 tablet (8 mg total) by mouth every 8 (eight) hours as needed for nausea. 30 tablet 0  . glimepiride (AMARYL) 2 MG tablet TK 1 T PO QD     No facility-administered medications prior to visit.     No Known Allergies  ROS Review of Systems  Constitutional: Negative.   HENT: Negative.   Eyes: Negative.   Respiratory: Negative.   Cardiovascular: Negative.   Gastrointestinal: Negative.   Endocrine: Negative.   Genitourinary: Negative.   Musculoskeletal: Negative.   Skin: Negative.   Allergic/Immunologic: Negative.   Neurological: Negative.   Hematological: Negative.   Psychiatric/Behavioral: Negative.       Objective:    Physical Exam  Constitutional: She is oriented to person, place, and time. She appears well-developed and well-nourished. No distress.  Cardiovascular: Normal rate, regular rhythm and normal heart sounds. Exam reveals no gallop and no friction rub.  No murmur heard. Pulmonary/Chest: Effort normal and breath sounds normal. No respiratory distress. She has no wheezes. She has no rales. She exhibits no tenderness.  Abdominal: Soft. Bowel sounds are normal.  Neurological: She is alert and oriented to person, place, and time.  Skin: Skin is warm and dry. No rash noted. She is not diaphoretic. No erythema. No pallor.  Psychiatric: She has a normal mood and affect. Her behavior is normal. Judgment and thought content normal.  Nursing note and vitals reviewed.   BP 140/60   Pulse 67   Temp 98.5 F (36.9 C) (Oral)   Resp 16   Ht 5\' 1"  (1.549 m)   Wt 110 lb (49.9 kg)   SpO2 97%   BMI 20.78 kg/m  Wt Readings from Last 3 Encounters:  01/12/19 110 lb (49.9 kg)  05/12/18 112 lb 6.4 oz (51 kg)  12/24/17 111 lb 9.6 oz (50.6 kg)     Health Maintenance Due  Topic Date Due  . OPHTHALMOLOGY EXAM  12/19/1963  . HIV Screening  12/18/1968   . TETANUS/TDAP  12/18/1972  . PAP SMEAR-Modifier  12/19/1974  . MAMMOGRAM  12/19/2003  . COLONOSCOPY  12/19/2003  . FOOT EXAM  01/21/2018  . HEMOGLOBIN A1C  11/10/2018  . DEXA SCAN  12/19/2018  . PNA vac Low Risk Adult (1 of 2 - PCV13) 12/19/2018    There are no preventive care reminders to display for this patient.  Lab Results  Component Value Date   TSH 0.99 05/17/2016   Lab Results  Component Value Date   WBC 9.2 12/16/2017   HGB 11.7 (A) 12/16/2017   HCT 37.3 (A) 12/16/2017   MCV 74.4 (A) 12/16/2017   PLT 316 04/30/2015   Lab Results  Component Value Date   NA 138 05/12/2018   K 4.0 05/12/2018   CO2 22 05/12/2018   GLUCOSE 142 (H) 05/12/2018   BUN 10 05/12/2018   CREATININE 0.74 05/12/2018   BILITOT 0.3 05/12/2018   ALKPHOS 71 05/12/2018   AST 15 05/12/2018   ALT 14 05/12/2018   PROT 7.6 05/12/2018   ALBUMIN 4.2 05/12/2018   CALCIUM 9.6 05/12/2018   ANIONGAP 10 04/30/2015   Lab Results  Component Value Date   CHOL 242 (H) 05/12/2018   Lab Results  Component Value Date   HDL 28 (L) 05/12/2018   Lab Results  Component Value Date   LDLCALC Comment 05/12/2018   Lab Results  Component Value Date   TRIG 925 (HH) 05/12/2018   Lab Results  Component Value Date   CHOLHDL 8.6 (H) 05/12/2018   Lab Results  Component Value Date   HGBA1C 7.4 (A) 05/12/2018      Assessment & Plan:   Problem List Items Addressed This Visit      Endocrine   Type 2 diabetes mellitus without complication, without long-term current use of insulin (HCC)   Relevant Medications   atorvastatin (LIPITOR) 40 MG tablet   glimepiride (AMARYL) 4 MG tablet   lisinopril (ZESTRIL) 10 MG tablet   metFORMIN (GLUCOPHAGE-XR) 750 MG 24 hr tablet   Other Relevant Orders   POCT glucose (manual entry) (Completed)    Other Visit Diagnoses    Essential hypertension       Relevant Medications   atorvastatin (LIPITOR) 40 MG tablet   gemfibrozil (LOPID) 600 MG tablet   lisinopril  (ZESTRIL) 10 MG tablet   Elevated triglycerides with high cholesterol       Relevant Medications   atorvastatin (LIPITOR) 40 MG tablet   gemfibrozil (LOPID) 600 MG tablet   lisinopril (ZESTRIL) 10 MG tablet   Nausea and vomiting, intractability of vomiting not specified, unspecified vomiting type       Relevant Medications   ondansetron (ZOFRAN-ODT) 8 MG disintegrating tablet   Left knee pain, unspecified chronicity          Meds ordered this encounter  Medications  . atorvastatin (LIPITOR) 40 MG tablet    Sig: Take 1 tablet (40 mg total) by mouth daily at 6 PM. 30 day supply given due to overdue for lipid panel.    Dispense:  90 tablet    Refill:  1    Order Specific Question:   Supervising Provider    Answer:   Collie SiadSTALLINGS, ZOE A [id number]]  . gemfibrozil (LOPID) 600 MG tablet    Sig: Take 1 tablet (600 mg total) by mouth 2 (two) times daily before a meal. TAKE 1 TABLET(600 MG) BY MOUTH TWICE DAILY BEFORE A MEAL    Dispense:  180 tablet    Refill:  1    Order Specific Question:   Supervising Provider    Answer:   Collie SiadSTALLINGS, ZOE A [id number]]  . glimepiride (AMARYL) 4 MG tablet    Sig: Take 1 tablet (4 mg total) by mouth daily. Take with breakfast or the first main meal.    Dispense:  90 tablet    Refill:  1    Order Specific Question:   Supervising Provider    Answer:   Collie SiadSTALLINGS, ZOE A [  [id number]]  . lisinopril (ZESTRIL) 10 MG tablet    Sig: TAKE 1 TABLET(10 MG) BY MOUTH DAILY    Dispense:  90 tablet    Refill:  1    Order Specific Question:   Supervising Provider    Answer:   Collie SiadSTALLINGS, ZOE A [id number]]  . metFORMIN (GLUCOPHAGE-XR) 750 MG 24 hr tablet    Sig: Take 1 tablet (750 mg total) by mouth 2 (two) times daily with a meal.    Dispense:  180 tablet    Refill:  1    OK to fill equivalent if needed    Order Specific Question:   Supervising Provider    Answer:   Collie SiadSTALLINGS, ZOE A [id number]]  . ondansetron (ZOFRAN-ODT) 8 MG disintegrating tablet    Sig: Take 1 tablet (8 mg  total) by mouth every 8 (eight) hours as needed for nausea.    Dispense:  30 tablet    Refill:  0    Order Specific Question:   Supervising Provider    Answer:   Doristine BosworthSTALLINGS, ZOE A [id number]]    Follow-up: No follow-ups on file.   PLAN  Labs drawn, will follow up as warranted.  Refilled meds  Will refer to care gaps at next visit per pt request (ophthalmology, podaitry, mammography, colonoscopy, etc).   Otherwise, feels she is in good health today. No urgent concerns.   Plan to follow up in 6 mos or sooner depending on lab work.  Patient encouraged to call clinic with any questions, comments, or concerns.   Janeece Ageeichard Cimberly Stoffel, NP

## 2019-01-13 LAB — CMP14+EGFR
ALT: 13 IU/L (ref 0–32)
AST: 17 IU/L (ref 0–40)
Albumin/Globulin Ratio: 1.3 (ref 1.2–2.2)
Albumin: 4.1 g/dL (ref 3.8–4.8)
Alkaline Phosphatase: 65 IU/L (ref 39–117)
BUN/Creatinine Ratio: 16 (ref 12–28)
BUN: 15 mg/dL (ref 8–27)
Bilirubin Total: 0.4 mg/dL (ref 0.0–1.2)
CO2: 21 mmol/L (ref 20–29)
Calcium: 9.5 mg/dL (ref 8.7–10.3)
Chloride: 103 mmol/L (ref 96–106)
Creatinine, Ser: 0.93 mg/dL (ref 0.57–1.00)
GFR calc Af Amer: 75 mL/min/{1.73_m2} (ref >59)
GFR calc non Af Amer: 65 mL/min/{1.73_m2} (ref >59)
Globulin, Total: 3.2 g/dL (ref 1.5–4.5)
Glucose: 173 mg/dL — ABNORMAL HIGH (ref 65–99)
Potassium: 4.4 mmol/L (ref 3.5–5.2)
Sodium: 140 mmol/L (ref 134–144)
Total Protein: 7.3 g/dL (ref 6.0–8.5)

## 2019-01-13 LAB — LIPID PANEL
Chol/HDL Ratio: 6.1 ratio — ABNORMAL HIGH (ref 0.0–4.4)
Cholesterol, Total: 219 mg/dL — ABNORMAL HIGH (ref 100–199)
HDL: 36 mg/dL — ABNORMAL LOW (ref >39)
LDL Calculated: 130 mg/dL — ABNORMAL HIGH (ref 0–99)
Triglycerides: 263 mg/dL — ABNORMAL HIGH (ref 0–149)
VLDL Cholesterol Cal: 53 mg/dL — ABNORMAL HIGH (ref 5–40)

## 2019-01-13 LAB — HEMOGLOBIN A1C
Est. average glucose Bld gHb Est-mCnc: 169 mg/dL
Hgb A1c MFr Bld: 7.5 % — ABNORMAL HIGH (ref 4.8–5.6)

## 2019-01-15 ENCOUNTER — Encounter: Payer: Self-pay | Admitting: Registered Nurse

## 2019-01-15 NOTE — Progress Notes (Signed)
Letter with results sent Pt to double Atorvastatin dose to 80mg  PO qd Call for refills Follow up in 3 mos  Kathrin Ruddy, NP

## 2019-03-10 ENCOUNTER — Encounter: Payer: Self-pay | Admitting: Registered Nurse

## 2019-07-07 ENCOUNTER — Other Ambulatory Visit: Payer: Self-pay | Admitting: Registered Nurse

## 2019-07-07 DIAGNOSIS — E119 Type 2 diabetes mellitus without complications: Secondary | ICD-10-CM

## 2019-07-07 DIAGNOSIS — E782 Mixed hyperlipidemia: Secondary | ICD-10-CM

## 2019-07-12 ENCOUNTER — Ambulatory Visit (INDEPENDENT_AMBULATORY_CARE_PROVIDER_SITE_OTHER): Payer: Medicare Other | Admitting: Registered Nurse

## 2019-07-12 ENCOUNTER — Other Ambulatory Visit: Payer: Self-pay

## 2019-07-12 ENCOUNTER — Encounter: Payer: Self-pay | Admitting: Registered Nurse

## 2019-07-12 ENCOUNTER — Telehealth: Payer: Self-pay | Admitting: Registered Nurse

## 2019-07-12 VITALS — BP 140/60 | HR 83 | Temp 97.1°F | Ht 60.0 in | Wt 113.0 lb

## 2019-07-12 DIAGNOSIS — I10 Essential (primary) hypertension: Secondary | ICD-10-CM | POA: Diagnosis not present

## 2019-07-12 DIAGNOSIS — E119 Type 2 diabetes mellitus without complications: Secondary | ICD-10-CM

## 2019-07-12 DIAGNOSIS — Z124 Encounter for screening for malignant neoplasm of cervix: Secondary | ICD-10-CM

## 2019-07-12 DIAGNOSIS — E782 Mixed hyperlipidemia: Secondary | ICD-10-CM | POA: Diagnosis not present

## 2019-07-12 DIAGNOSIS — M25512 Pain in left shoulder: Secondary | ICD-10-CM

## 2019-07-12 DIAGNOSIS — Z1231 Encounter for screening mammogram for malignant neoplasm of breast: Secondary | ICD-10-CM

## 2019-07-12 DIAGNOSIS — M25511 Pain in right shoulder: Secondary | ICD-10-CM

## 2019-07-12 DIAGNOSIS — Z1211 Encounter for screening for malignant neoplasm of colon: Secondary | ICD-10-CM | POA: Diagnosis not present

## 2019-07-12 DIAGNOSIS — Z114 Encounter for screening for human immunodeficiency virus [HIV]: Secondary | ICD-10-CM

## 2019-07-12 LAB — POCT GLYCOSYLATED HEMOGLOBIN (HGB A1C): Hemoglobin A1C: 8.4 % — AB (ref 4.0–5.6)

## 2019-07-12 MED ORDER — METFORMIN HCL 500 MG PO TABS: 500.0000 mg | ORAL_TABLET | Freq: Two times a day (BID) | ORAL | 3 refills | Status: DC

## 2019-07-12 MED ORDER — GLIMEPIRIDE 4 MG PO TABS: 4.0000 mg | ORAL_TABLET | Freq: Every day | ORAL | 1 refills | Status: DC

## 2019-07-12 MED ORDER — ATORVASTATIN CALCIUM 40 MG PO TABS: 40.0000 mg | ORAL_TABLET | Freq: Every day | ORAL | 1 refills | Status: DC

## 2019-07-12 MED ORDER — LISINOPRIL 20 MG PO TABS: 20.0000 mg | ORAL_TABLET | Freq: Every day | ORAL | 3 refills | Status: DC

## 2019-07-12 NOTE — Patient Instructions (Signed)
° ° ° °  If you have lab work done today you will be contacted with your lab results within the next 2 weeks.  If you have not heard from us then please contact us. The fastest way to get your results is to register for My Chart. ° ° °IF you received an x-ray today, you will receive an invoice from [facility]. Please contact [facility] at [phone] with questions or concerns regarding your invoice.  ° °IF you received labwork today, you will receive an invoice from [facility]. Please contact [facility] at [phone] with questions or concerns regarding your invoice.  ° °Our billing staff will not be able to assist you with questions regarding bills from these companies. ° °You will be contacted with the lab results as soon as they are available. The fastest way to get your results is to activate your My Chart account. Instructions are located on the last page of this paperwork. If you have not heard from us regarding the results in 2 weeks, please contact this office. °  ° ° ° °

## 2019-07-12 NOTE — Telephone Encounter (Signed)
Patient have an appt today and will been handled in office

## 2019-07-12 NOTE — Telephone Encounter (Signed)
Pt needs Lisinopril 10 MG , glimepiride 4mg  and Atrovastine  40 mg. Pt will make ov today but is completely out of all meds please send courtesu refill  FR

## 2019-07-13 LAB — LIPID PANEL
Chol/HDL Ratio: 4.4 ratio (ref 0.0–4.4)
Cholesterol, Total: 182 mg/dL (ref 100–199)
HDL: 41 mg/dL (ref >39)
LDL Chol Calc (NIH): 104 mg/dL — ABNORMAL HIGH (ref 0–99)
Triglycerides: 217 mg/dL — ABNORMAL HIGH (ref 0–149)
VLDL Cholesterol Cal: 37 mg/dL (ref 5–40)

## 2019-07-13 LAB — HIV ANTIBODY (ROUTINE TESTING W REFLEX): HIV Screen 4th Generation wRfx: NONREACTIVE

## 2019-07-13 LAB — ANA: Anti Nuclear Antibody (ANA): POSITIVE — AB

## 2019-07-14 ENCOUNTER — Encounter: Payer: Self-pay | Admitting: Registered Nurse

## 2019-07-14 ENCOUNTER — Encounter: Payer: Self-pay | Admitting: Gastroenterology

## 2019-07-14 DIAGNOSIS — M25551 Pain in right hip: Secondary | ICD-10-CM

## 2019-07-14 DIAGNOSIS — G8929 Other chronic pain: Secondary | ICD-10-CM | POA: Insufficient documentation

## 2019-07-14 DIAGNOSIS — M25512 Pain in left shoulder: Secondary | ICD-10-CM

## 2019-07-14 DIAGNOSIS — M25511 Pain in right shoulder: Secondary | ICD-10-CM | POA: Insufficient documentation

## 2019-07-14 DIAGNOSIS — R768 Other specified abnormal immunological findings in serum: Secondary | ICD-10-CM

## 2019-07-14 HISTORY — DX: Other chronic pain: G89.29

## 2019-07-14 NOTE — Progress Notes (Signed)
Good morning,  If someone could give Ms. Trevathan a call to let her know that her labs are back, and that her ANA is positive - this is a general marker for autoimmune activity. I am going to refer her to a rheumatologist, a specialist who should be able to help her with this.  Thank you  Jari Sportsman, NP

## 2019-07-14 NOTE — Progress Notes (Signed)
Results in Lipids steady ANA positive Will refer to Rheum Will call and send letter  Jari Sportsman, NP

## 2019-07-14 NOTE — Progress Notes (Signed)
Established Patient Office Visit  Subjective:  Patient ID: Cathy Robbins, female    DOB: [birth year]  Age: 64 y.o. MRN: [id number]  CC:  Chief Complaint  Patient presents with  . Medication Refill  . Dizziness  . ongoing pain on arms and shoulder    maybe 4 years now.     HPI Tambi Tadros presents for follow up and medication refill.   Needs refill on atorvastatin, metformin, lisinopril and amaryl.   No complaints.  Does not check sugars or BP regularly at home.  Sometimes dizzy, intermittent, no falls, no LOC. Cannot identify trends regarding time of day, pre or post prandial, pre or post exercise. Encourage her to get home BP cuff and monitor sugars - keep a snack on hand in case of hypoglycemia. Will draw labs today.  Also reports some large joint pain that has been intermittent for a few years. Not relieved with rest or OTCs. It looks like she has not had an ANA drawn in the past, will start with this.  Past Medical History:  Diagnosis Date  . Diabetes mellitus without complication (HCC)     No past surgical history on file.  No family history on file.  Social History   Socioeconomic History  . Marital status: Married    Spouse name: Not on file  . Number of children: 4  . Years of education: Not on file  . Highest education level: Not on file  Occupational History  . Not on file  Tobacco Use  . Smoking status: Never Smoker  . Smokeless tobacco: Never Used  Substance and Sexual Activity  . Alcohol use: No  . Drug use: No  . Sexual activity: Not Currently  Other Topics Concern  . Not on file  Social History Narrative  . Not on file   Social Determinants of Health   Financial Resource Strain: Low Risk   . Difficulty of Paying Living Expenses: Not hard at all  Food Insecurity: No Food Insecurity  . Worried About Programme researcher, broadcasting/film/video in the Last Year: Never true  . Ran Out of Food in the Last Year: Never true  Transportation Needs: No Transportation Needs  . Lack  of Transportation (Medical): No  . Lack of Transportation (Non-Medical): No  Physical Activity: Sufficiently Active  . Days of Exercise per Week: 5 days  . Minutes of Exercise per Session: 30 min  Stress: No Stress Concern Present  . Feeling of Stress : Not at all  Social Connections: Somewhat Isolated  . Frequency of Communication with Friends and Family: Twice a week  . Frequency of Social Gatherings with Friends and Family: Once a week  . Attends Religious Services: Never  . Active Member of Clubs or Organizations: No  . Attends Banker Meetings: Never  . Marital Status: Married  Catering manager Violence: Not At Risk  . Fear of Current or Ex-Partner: No  . Emotionally Abused: No  . Physically Abused: No  . Sexually Abused: No    Outpatient Medications Prior to Visit  Medication Sig Dispense Refill  . Alcohol Swabs (ALCOHOL WIPES) 70 % PADS 1 application by Does not apply route 2 (two) times daily. 100 each 5  . Blood Glucose Calibration (CONTOUR NEXT CONTROL) Normal SOLN 1 drop by In Vitro route as needed. 1 each 2  . Garlic 705 MG CAPS Take 1 capsule (705 mg total) by mouth daily. 60 capsule 4  . gemfibrozil (LOPID) 600 MG tablet Take 1  tablet (600 mg total) by mouth 2 (two) times daily before a meal. TAKE 1 TABLET(600 MG) BY MOUTH TWICE DAILY BEFORE A MEAL 180 tablet 1  . Omega-3 Fatty Acids (FISH OIL) 1000 MG CAPS Take by mouth.    . traMADol-acetaminophen (ULTRACET) 37.5-325 MG tablet Take 1 tablet by mouth every 6 (six) hours as needed. 30 tablet 0  . trolamine salicylate (ASPERCREME/ALOE) 10 % cream Apply 1 application topically as needed for muscle pain. 85 g 0  . atorvastatin (LIPITOR) 40 MG tablet TAKE 1 TABLET BY MOUTH DAILY AT 6PM. OVERDUE FOR LIPID PANEL. 90 tablet 1  . glimepiride (AMARYL) 4 MG tablet Take 1 tablet (4 mg total) by mouth daily. Take with breakfast or the first main meal. 90 tablet 1  . lisinopril (ZESTRIL) 10 MG tablet TAKE 1 TABLET(10 MG)  BY MOUTH DAILY 90 tablet 1  . metFORMIN (GLUCOPHAGE-XR) 750 MG 24 hr tablet TAKE 1 TABLET(750 MG) BY MOUTH TWICE DAILY WITH A MEAL 180 tablet 1  . ondansetron (ZOFRAN-ODT) 8 MG disintegrating tablet Take 1 tablet (8 mg total) by mouth every 8 (eight) hours as needed for nausea. (Patient not taking: Reported on 07/12/2019) 30 tablet 0   No facility-administered medications prior to visit.    No Known Allergies  ROS Review of Systems Per hpi, all others negative on 12 system review   Objective:    Physical Exam  Constitutional: She is oriented to person, place, and time. She appears well-developed and well-nourished. No distress.  Cardiovascular: Normal rate and regular rhythm.  Pulmonary/Chest: Effort normal. No respiratory distress.  Musculoskeletal:        General: No tenderness. Normal range of motion.  Neurological: She is alert and oriented to person, place, and time.  Skin: Skin is warm and dry. No rash noted. She is not diaphoretic. No erythema. No pallor.  Psychiatric: She has a normal mood and affect. Her behavior is normal. Judgment and thought content normal.  Nursing note and vitals reviewed.   BP 140/60   Pulse 83   Temp (!) 97.1 F (36.2 C) (Temporal)   Ht 5' (1.524 m)   Wt 113 lb (51.3 kg)   BMI 22.07 kg/m  Wt Readings from Last 3 Encounters:  07/12/19 113 lb (51.3 kg)  01/12/19 110 lb (49.9 kg)  05/12/18 112 lb 6.4 oz (51 kg)     Health Maintenance Due  Topic Date Due  . OPHTHALMOLOGY EXAM  12/19/1963  . TETANUS/TDAP  12/18/1972  . PAP SMEAR-Modifier  12/19/1974  . MAMMOGRAM  12/19/2003  . COLONOSCOPY  12/19/2003  . FOOT EXAM  01/21/2018  . DEXA SCAN  12/19/2018  . PNA vac Low Risk Adult (1 of 2 - PCV13) 12/19/2018  . INFLUENZA VACCINE  01/30/2019    There are no preventive care reminders to display for this patient.  Lab Results  Component Value Date   TSH 0.99 05/17/2016   Lab Results  Component Value Date   WBC 9.2 12/16/2017   HGB 11.7  (A) 12/16/2017   HCT 37.3 (A) 12/16/2017   MCV 74.4 (A) 12/16/2017   PLT 316 04/30/2015   Lab Results  Component Value Date   NA 140 01/12/2019   K 4.4 01/12/2019   CO2 21 01/12/2019   GLUCOSE 173 (H) 01/12/2019   BUN 15 01/12/2019   CREATININE 0.93 01/12/2019   BILITOT 0.4 01/12/2019   ALKPHOS 65 01/12/2019   AST 17 01/12/2019   ALT 13 01/12/2019   PROT 7.3  01/12/2019   ALBUMIN 4.1 01/12/2019   CALCIUM 9.5 01/12/2019   ANIONGAP 10 04/30/2015   Lab Results  Component Value Date   CHOL 182 07/12/2019   Lab Results  Component Value Date   HDL 41 07/12/2019   Lab Results  Component Value Date   LDLCALC 104 (H) 07/12/2019   Lab Results  Component Value Date   TRIG 217 (H) 07/12/2019   Lab Results  Component Value Date   CHOLHDL 4.4 07/12/2019   Lab Results  Component Value Date   HGBA1C 8.4 (A) 07/12/2019      Assessment & Plan:   Problem List Items Addressed This Visit      Endocrine   Type 2 diabetes mellitus without complication, without long-term current use of insulin (HCC) - Primary   Relevant Medications   atorvastatin (LIPITOR) 40 MG tablet   glimepiride (AMARYL) 4 MG tablet   lisinopril (ZESTRIL) 20 MG tablet   metFORMIN (GLUCOPHAGE) 500 MG tablet   Other Relevant Orders   POCT glycosylated hemoglobin (Hb A1C) (Completed)   Ambulatory referral to Ophthalmology    Other Visit Diagnoses    Elevated triglycerides with high cholesterol       Relevant Medications   atorvastatin (LIPITOR) 40 MG tablet   lisinopril (ZESTRIL) 20 MG tablet   Other Relevant Orders   Lipid Panel (Completed)   Essential hypertension       Relevant Medications   atorvastatin (LIPITOR) 40 MG tablet   lisinopril (ZESTRIL) 20 MG tablet   Special screening for malignant neoplasms, colon       Relevant Orders   Ambulatory referral to Gastroenterology   Screening mammogram, encounter for       Relevant Orders   MM Digital Screening   Papanicolaou smear       Relevant  Orders   Ambulatory referral to Gynecology   Pain of both shoulder joints       Relevant Orders   ANA (Completed)   Encounter for screening for HIV       Relevant Orders   HIV antibody (with reflex) (Completed)      Meds ordered this encounter  Medications  . atorvastatin (LIPITOR) 40 MG tablet    Sig: Take 1 tablet (40 mg total) by mouth daily at 6 PM.    Dispense:  90 tablet    Refill:  1  . glimepiride (AMARYL) 4 MG tablet    Sig: Take 1 tablet (4 mg total) by mouth daily. Take with breakfast or the first main meal.    Dispense:  90 tablet    Refill:  1  . lisinopril (ZESTRIL) 20 MG tablet    Sig: Take 1 tablet (20 mg total) by mouth daily.    Dispense:  90 tablet    Refill:  3    Order Specific Question:   Supervising Provider    Answer:   Collie Siad A [id number]  . metFORMIN (GLUCOPHAGE) 500 MG tablet    Sig: Take 1 tablet (500 mg total) by mouth 2 (two) times daily with a meal.    Dispense:  180 tablet    Refill:  3    Order Specific Question:   Supervising Provider    Answer:   Doristine Bosworth [id number]    Follow-up: No follow-ups on file.   PLAN  Medications refilled  Labs drawn  Suggest home monitoring of glucose and BP   Recommend diary of dizziness/lightheadedness  Will follow up as warranted, otherwise, 6 mos  at latest.  Patient encouraged to call clinic with any questions, comments, or concerns.  Maximiano Coss, NP

## 2019-07-15 ENCOUNTER — Encounter: Payer: Self-pay | Admitting: Registered Nurse

## 2019-07-22 ENCOUNTER — Telehealth: Payer: Self-pay | Admitting: *Deleted

## 2019-07-22 NOTE — Telephone Encounter (Signed)
Called pt at 130 pm and LM No showed PV at 1 pm Instructed to Call Back by 5 pm to RS PV or PV and Colon will be canceled  1712 pm no CB, canceled letter and PV, mailed No show letter

## 2019-08-03 ENCOUNTER — Encounter: Payer: Self-pay | Admitting: Gastroenterology

## 2019-09-22 ENCOUNTER — Telehealth: Payer: Self-pay | Admitting: Registered Nurse

## 2019-09-22 DIAGNOSIS — I10 Essential (primary) hypertension: Secondary | ICD-10-CM

## 2019-09-22 MED ORDER — LISINOPRIL 10 MG PO TABS: ORAL_TABLET | ORAL | 3 refills | Status: DC

## 2019-09-22 NOTE — Telephone Encounter (Signed)
What is the name of the medication? lisinopril (ZESTRIL) 20 MG tablet [[id number]]    Have you contacted your pharmacy to request a refill? Yes. lisinopril (ZESTRIL) 20 MG tablet [[id number]]   Order Details Dose: 20 mg Route: Oral Frequency: Daily  Dispense Quantity: 90 tablet     This is not what his bottle stated. He came in with his bottle and it stated-60/pills with no refills. He is completely out.   Which pharmacy would you like this sent to? Pharmacy  [facility] [id number] [place], [place] - [address] AT [facility] OF [facility] [address]  [address] [place], [place] [address]  Phone:  [phone] Fax:  [phone]  DEA #:  [id number]      Patient notified that their request is being sent to the clinical staff for review and that they should receive a call once it is complete. If they do not receive a call within 72 hours they can check with their pharmacy or our office.

## 2019-09-22 NOTE — Telephone Encounter (Signed)
We can send over 10mg  tabs Please fill to the same duration that the original order was sent  Thank you  , NP

## 2019-09-22 NOTE — Telephone Encounter (Signed)
Rx has been sent  

## 2019-09-22 NOTE — Telephone Encounter (Signed)
Called the pharmacy and the pt picked up the 20 mg of lisinopril on 07/12/19  Called pt and used [employer] language services and spoke to pt and her husband, they stated the 20 mg tabs makes pt dizzy. She has taken it 2-3 times and doesn't want to take the 20 mg anymore.  They used up the left over 10 mg tab of lisinopril and they are now out for the last couple of days. The pt stated that they can not cut the tab in half because it is too small.  Please advise on possibly switching pt back on the 10 mg of lisinopril

## 2020-01-27 ENCOUNTER — Other Ambulatory Visit: Payer: Self-pay | Admitting: Registered Nurse

## 2020-01-27 DIAGNOSIS — E119 Type 2 diabetes mellitus without complications: Secondary | ICD-10-CM

## 2020-01-27 NOTE — Telephone Encounter (Signed)
Called pt LVMTCB on 01/26/20 @ 2:00 Pm

## 2020-01-27 NOTE — Telephone Encounter (Signed)
No further refills without office visit 

## 2020-01-27 NOTE — Telephone Encounter (Signed)
Please schedule appt for f/u on diabetes and med refills

## 2020-02-17 ENCOUNTER — Other Ambulatory Visit: Payer: Self-pay | Admitting: Registered Nurse

## 2020-02-17 ENCOUNTER — Telehealth: Payer: Self-pay | Admitting: Registered Nurse

## 2020-02-17 DIAGNOSIS — E782 Mixed hyperlipidemia: Secondary | ICD-10-CM

## 2020-02-17 NOTE — Telephone Encounter (Signed)
Called pt LVM for her to call our office (Patient needs OV for additional refills.) 

## 2020-02-17 NOTE — Telephone Encounter (Signed)
Med looks like it has already been refilled today

## 2020-02-17 NOTE — Telephone Encounter (Signed)
Called pt LVM for her to call our office (Patient needs OV for additional refills.)

## 2020-02-17 NOTE — Telephone Encounter (Signed)
Patient needs OV for additional refills 

## 2020-02-21 ENCOUNTER — Other Ambulatory Visit: Payer: Self-pay | Admitting: Registered Nurse

## 2020-02-21 DIAGNOSIS — E119 Type 2 diabetes mellitus without complications: Secondary | ICD-10-CM

## 2020-03-08 ENCOUNTER — Other Ambulatory Visit: Payer: Self-pay

## 2020-03-08 ENCOUNTER — Ambulatory Visit (INDEPENDENT_AMBULATORY_CARE_PROVIDER_SITE_OTHER): Payer: Medicare Other | Admitting: Registered Nurse

## 2020-03-08 ENCOUNTER — Encounter: Payer: Self-pay | Admitting: Registered Nurse

## 2020-03-08 VITALS — BP 130/74 | HR 71 | Temp 98.2°F | Resp 18 | Ht 60.0 in | Wt 110.2 lb

## 2020-03-08 DIAGNOSIS — E119 Type 2 diabetes mellitus without complications: Secondary | ICD-10-CM

## 2020-03-08 DIAGNOSIS — Z23 Encounter for immunization: Secondary | ICD-10-CM | POA: Diagnosis not present

## 2020-03-08 DIAGNOSIS — M5432 Sciatica, left side: Secondary | ICD-10-CM | POA: Diagnosis not present

## 2020-03-08 LAB — POCT GLYCOSYLATED HEMOGLOBIN (HGB A1C): Hemoglobin A1C: 8.8 % — AB (ref 4.0–5.6)

## 2020-03-08 MED ORDER — DULAGLUTIDE 0.75 MG/0.5ML ~~LOC~~ SOAJ: 0.7500 mg | SUBCUTANEOUS | 0 refills | Status: DC

## 2020-03-08 MED ORDER — GABAPENTIN 100 MG PO CAPS: 100.0000 mg | ORAL_CAPSULE | Freq: Three times a day (TID) | ORAL | 3 refills | Status: DC

## 2020-03-08 MED ORDER — GLIMEPIRIDE 4 MG PO TABS: ORAL_TABLET | ORAL | 0 refills | Status: DC

## 2020-03-08 NOTE — Patient Instructions (Addendum)
Great to see you today.  I would like to have you start on a medication called Trulicity (Dulaglutide). This is the injection we discussed. It is taken once weekly. It is very effective at lowering your blood sugars and helping to prevent heart attack, stroke, and heart failure. It works well with your metformin and glimepiride.   In addition, I have sent in a medication called Neurontin (Gabapentin). This can be taken as much as three times daily to help with the pain in your hip and leg. We can discuss increasing this at a future visit - but it is best to start at a lower dose and slowly increase.  I would like to see you back again in about 3 months - please feel free to come in sooner if you have any concerns.   Thank you  Jari Sportsman, NP   R?t vui ???c g?p b?n hm nay.  Ti mu?n b?n b?t ??u dng m?t lo?i thu?c c tn l Trulicity (Dulaglutide). ?y l cch tim m chng ta ? th?o lu?n. N ???c th?c hi?n m?t l?n m?i tu?n. N r?t hi?u qu? trong vi?c gi?m l??ng ???ng trong mu c?a b?n v gip ng?n ng?a ?au tim, ??t qu? v suy tim. N ho?t ??ng t?t v?i metformin v glimepiride c?a b?n.  Ngoi ra, ti ? g?i m?t lo?i thu?c tn l Neurontin (Gabapentin). Thu?c ny c th? ???c th?c hi?n nhi?u nh?t l ba l?n m?i ngy ?? gip gi?m ?au ? hng v chn c?a b?n. Chng ta c th? th?o lu?n v? vi?c t?ng li?u ny vo m?t l?n th?m khm trong t??ng lai - nh?ng t?t nh?t l nn b?t ??u v?i li?u l??ng th?p h?n v t?ng t? t?.  Ti mu?n g?p l?i b?n sau kho?ng 3 thng - vui lng ??n s?m h?n n?u b?n c b?t k? th?c m?c no.  C?m ?n b?n  Jari Sportsman, NP   If you have lab work done today you will be contacted with your lab results within the next 2 weeks.  If you have not heard from [place] then please contact us. The fastest way to get your results is to register for My Chart.   IF you received an x-ray today, you will receive an invoice from [facility]. Please contact [facility] at  [phone] with questions or concerns regarding your invoice.   IF you received labwork today, you will receive an invoice from [facility]. Please contact [facility] at [phone] with questions or concerns regarding your invoice.   Our billing staff will not be able to assist you with questions regarding bills from these companies.  You will be contacted with the lab results as soon as they are available. The fastest way to get your results is to activate your My Chart account. Instructions are located on the last page of this paperwork. If you have not heard from [place] regarding the results in 2 weeks, please contact this office.

## 2020-05-27 NOTE — Progress Notes (Signed)
Established Patient Office Visit  Subjective:  Patient ID: Cathy Robbins, female    DOB: [birth year]  Age: 64 y.o. MRN: [id number]  CC:  Chief Complaint  Patient presents with  . Medication Refill    patient needs a refill on glimpiride. Patient has no other questions or concerns    HPI Cathy Robbins presents for refill on glimepiride   Last A1c at 8.4. feeling well. No changes. Reports diet and lifestyle are so-so. No new complications No new complaints.   Past Medical History:  Diagnosis Date  . Diabetes mellitus without complication (HCC)     No past surgical history on file.  No family history on file.  Social History   Socioeconomic History  . Marital status: Married    Spouse name: Not on file  . Number of children: 4  . Years of education: Not on file  . Highest education level: Not on file  Occupational History  . Not on file  Tobacco Use  . Smoking status: Never Smoker  . Smokeless tobacco: Never Used  Vaping Use  . Vaping Use: Never used  Substance and Sexual Activity  . Alcohol use: No  . Drug use: No  . Sexual activity: Not Currently  Other Topics Concern  . Not on file  Social History Narrative  . Not on file   Social Determinants of Health   Financial Resource Strain:   . Difficulty of Paying Living Expenses: Not on file  Food Insecurity:   . Worried About Programme researcher, broadcasting/film/video in the Last Year: Not on file  . Ran Out of Food in the Last Year: Not on file  Transportation Needs:   . Lack of Transportation (Medical): Not on file  . Lack of Transportation (Non-Medical): Not on file  Physical Activity:   . Days of Exercise per Week: Not on file  . Minutes of Exercise per Session: Not on file  Stress:   . Feeling of Stress : Not on file  Social Connections:   . Frequency of Communication with Friends and Family: Not on file  . Frequency of Social Gatherings with Friends and Family: Not on file  . Attends Religious Services: Not on file  . Active  Member of Clubs or Organizations: Not on file  . Attends Banker Meetings: Not on file  . Marital Status: Not on file  Intimate Partner Violence:   . Fear of Current or Ex-Partner: Not on file  . Emotionally Abused: Not on file  . Physically Abused: Not on file  . Sexually Abused: Not on file    Outpatient Medications Prior to Visit  Medication Sig Dispense Refill  . atorvastatin (LIPITOR) 40 MG tablet Take 1 tablet (40 mg total) by mouth daily at 6 PM. 90 tablet 1  . Blood Glucose Calibration (CONTOUR NEXT CONTROL) Normal SOLN 1 drop by In Vitro route as needed. 1 each 2  . Garlic 705 MG CAPS Take 1 capsule (705 mg total) by mouth daily. 60 capsule 4  . gemfibrozil (LOPID) 600 MG tablet TAKE 1 TABLET(600 MG) BY MOUTH TWICE DAILY BEFORE A MEAL 60 tablet 0  . lisinopril (ZESTRIL) 10 MG tablet TAKE 1 TABLET(10 MG) BY MOUTH DAILY 90 tablet 3  . metFORMIN (GLUCOPHAGE) 500 MG tablet Take 1 tablet (500 mg total) by mouth 2 (two) times daily with a meal. 180 tablet 3  . Omega-3 Fatty Acids (FISH OIL) 1000 MG CAPS Take by mouth.    Marland Kitchen glimepiride (AMARYL)  4 MG tablet TAKE 1 TABLET BY MOUTH EVERY DAY WITH BREAKFAST OR THE FIRST MAIN MEAL OF THE DAY 90 tablet 0  . Alcohol Swabs (ALCOHOL WIPES) 70 % PADS 1 application by Does not apply route 2 (two) times daily. (Patient not taking: Reported on 03/08/2020) 100 each 5  . traMADol-acetaminophen (ULTRACET) 37.5-325 MG tablet Take 1 tablet by mouth every 6 (six) hours as needed. (Patient not taking: Reported on 03/08/2020) 30 tablet 0  . trolamine salicylate (ASPERCREME/ALOE) 10 % cream Apply 1 application topically as needed for muscle pain. (Patient not taking: Reported on 03/08/2020) 85 g 0   No facility-administered medications prior to visit.    No Known Allergies  ROS Review of Systems  Constitutional: Negative.   HENT: Negative.   Eyes: Negative.   Respiratory: Negative.   Cardiovascular: Negative.   Gastrointestinal: Negative.     Genitourinary: Negative.   Musculoskeletal: Negative.   Skin: Negative.   Neurological: Negative.   Psychiatric/Behavioral: Negative.   All other systems reviewed and are negative.     Objective:    Physical Exam Vitals and nursing note reviewed.  Constitutional:      General: She is not in acute distress.    Appearance: Normal appearance. She is normal weight. She is not ill-appearing, toxic-appearing or diaphoretic.  Cardiovascular:     Rate and Rhythm: Normal rate and regular rhythm.     Heart sounds: Normal heart sounds. No murmur heard.  No friction rub. No gallop.   Pulmonary:     Effort: Pulmonary effort is normal. No respiratory distress.     Breath sounds: Normal breath sounds. No stridor. No wheezing, rhonchi or rales.  Chest:     Chest wall: No tenderness.  Skin:    General: Skin is warm and dry.  Neurological:     General: No focal deficit present.     Mental Status: She is alert and oriented to person, place, and time. Mental status is at baseline.  Psychiatric:        Mood and Affect: Mood normal.        Behavior: Behavior normal.        Thought Content: Thought content normal.        Judgment: Judgment normal.     BP 130/74   Pulse 71   Temp 98.2 F (36.8 C) (Temporal)   Resp 18   Ht 5' (1.524 m)   Wt 110 lb 3.2 oz (50 kg)   SpO2 97%   BMI 21.52 kg/m  Wt Readings from Last 3 Encounters:  03/08/20 110 lb 3.2 oz (50 kg)  07/12/19 113 lb (51.3 kg)  01/12/19 110 lb (49.9 kg)     Health Maintenance Due  Topic Date Due  . OPHTHALMOLOGY EXAM  Never done  . MAMMOGRAM  Never done  . FOOT EXAM  01/21/2018    There are no preventive care reminders to display for this patient.  Lab Results  Component Value Date   TSH 0.99 05/17/2016   Lab Results  Component Value Date   WBC 9.2 12/16/2017   HGB 11.7 (A) 12/16/2017   HCT 37.3 (A) 12/16/2017   MCV 74.4 (A) 12/16/2017   PLT 316 04/30/2015   Lab Results  Component Value Date   NA 140  01/12/2019   K 4.4 01/12/2019   CO2 21 01/12/2019   GLUCOSE 173 (H) 01/12/2019   BUN 15 01/12/2019   CREATININE 0.93 01/12/2019   BILITOT 0.4 01/12/2019   ALKPHOS 65  01/12/2019   AST 17 01/12/2019   ALT 13 01/12/2019   PROT 7.3 01/12/2019   ALBUMIN 4.1 01/12/2019   CALCIUM 9.5 01/12/2019   ANIONGAP 10 04/30/2015   Lab Results  Component Value Date   CHOL 182 07/12/2019   Lab Results  Component Value Date   HDL 41 07/12/2019   Lab Results  Component Value Date   LDLCALC 104 (H) 07/12/2019   Lab Results  Component Value Date   TRIG 217 (H) 07/12/2019   Lab Results  Component Value Date   CHOLHDL 4.4 07/12/2019   Lab Results  Component Value Date   HGBA1C 8.8 (A) 03/08/2020      Assessment & Plan:   Problem List Items Addressed This Visit      Endocrine   Type 2 diabetes mellitus without complication, without long-term current use of insulin (HCC) - Primary   Relevant Medications   glimepiride (AMARYL) 4 MG tablet   Dulaglutide 0.75 MG/0.5ML SOPN   Other Relevant Orders   POCT glycosylated hemoglobin (Hb A1C) (Completed)    Other Visit Diagnoses    Flu vaccine need       Relevant Orders   Flu Vaccine QUAD High Dose(Fluad) (Completed)   Left sciatic nerve pain       Relevant Medications   gabapentin (NEURONTIN) 100 MG capsule      Meds ordered this encounter  Medications  . glimepiride (AMARYL) 4 MG tablet    Sig: TAKE 1 TABLET BY MOUTH EVERY DAY WITH BREAKFAST OR THE FIRST MAIN MEAL OF THE DAY    Dispense:  90 tablet    Refill:  0    **Patient requests 90 days supply**  . Dulaglutide 0.75 MG/0.5ML SOPN    Sig: Inject 0.5 mLs (0.75 mg total) into the skin once a week.    Dispense:  6 mL    Refill:  0    Order Specific Question:   Supervising Provider    Answer:   Neva Seat, JEFFREY R [[id number]]  . gabapentin (NEURONTIN) 100 MG capsule    Sig: Take 1 capsule (100 mg total) by mouth 3 (three) times daily.    Dispense:  90 capsule    Refill:  3     Order Specific Question:   Supervising Provider    Answer:   Neva Seat, JEFFREY R [[id number]]    Follow-up: No follow-ups on file.   PLAN  a1c up to 8.8  Start dulaglutide 0.75mg /0.55mL subq weekly  Return in 3 mo  Improve diet and lifestyle  Refill gabapentin  Patient encouraged to call clinic with any questions, comments, or concerns.  Janeece Agee, NP

## 2020-06-05 ENCOUNTER — Other Ambulatory Visit: Payer: Self-pay | Admitting: Registered Nurse

## 2020-06-05 ENCOUNTER — Telehealth: Payer: Self-pay | Admitting: Registered Nurse

## 2020-06-05 DIAGNOSIS — E119 Type 2 diabetes mellitus without complications: Secondary | ICD-10-CM

## 2020-06-05 NOTE — Telephone Encounter (Signed)
  Notes to clinic:  Patient has appointment on 06/07/2020   Requested Prescriptions  Pending Prescriptions Disp Refills   glimepiride (AMARYL) 4 MG tablet [Pharmacy Med Name: GLIMEPIRIDE 4MG  TABLETS] 90 tablet 0    Sig: TAKE 1 TABLET BY MOUTH EVERY DAY WITH BREAKFAST OR THE FIRST MAIN MEAL OF THE DAY      Endocrinology:  Diabetes - Sulfonylureas Failed - 06/05/2020 11:36 AM      Failed - HBA1C is between 0 and 7.9 and within 180 days    Hemoglobin A1C  Date Value Ref Range Status  03/08/2020 8.8 (A) 4.0 - 5.6 % Final   Hgb A1c MFr Bld  Date Value Ref Range Status  01/12/2019 7.5 (H) 4.8 - 5.6 % Final    Comment:             Prediabetes: 5.7 - 6.4          Diabetes: >6.4          Glycemic control for adults with diabetes: <7.0           Failed - Valid encounter within last 6 months    Recent Outpatient Visits           2 months ago Type 2 diabetes mellitus without complication, without long-term current use of insulin (HCC)   Primary Care at 01/14/2019, Richard, NP   10 months ago Type 2 diabetes mellitus without complication, without long-term current use of insulin (HCC)   [facility], Richard, NP   1 year ago Type 2 diabetes mellitus without complication, without long-term current use of insulin (HCC)   [facility], Richard, NP   2 years ago Hyperlipidemia, unspecified hyperlipidemia type   Primary Care at [facility], [facility], PA-C   2 years ago Type 2 diabetes mellitus without complication, without long-term current use of insulin [facility])   Primary Care at [facility], [facility], PA-C       Future Appointments             In 2 days Madelaine Bhat, NP Primary Care at [place], [facility]

## 2020-06-05 NOTE — Telephone Encounter (Signed)
Pt has an appointment coming up. Rx sent to pharmacy.

## 2020-06-05 NOTE — Telephone Encounter (Signed)
[place] rx has been sent to pharmacy.

## 2020-06-05 NOTE — Telephone Encounter (Signed)
Patient took her last pill this morning.  glimepiride (AMARYL) 4 MG tablet [[id number]]  Please use the following pharmacy: [id number] [place], [place] - [address] AT [facility] [address]  [address] [place], [place] [address]  Phone:  [phone] Fax:  [phone]  DEA #:  [id number]  Please advise at [phone]. Patient has an appointment on Wednesday, 06/07/2020.

## 2020-06-07 ENCOUNTER — Other Ambulatory Visit: Payer: Self-pay

## 2020-06-07 ENCOUNTER — Ambulatory Visit (INDEPENDENT_AMBULATORY_CARE_PROVIDER_SITE_OTHER): Payer: Medicare Other | Admitting: Registered Nurse

## 2020-06-07 ENCOUNTER — Encounter: Payer: Self-pay | Admitting: Registered Nurse

## 2020-06-07 VITALS — BP 152/69 | HR 69 | Temp 97.6°F | Resp 18 | Ht 60.0 in | Wt 106.8 lb

## 2020-06-07 DIAGNOSIS — E782 Mixed hyperlipidemia: Secondary | ICD-10-CM

## 2020-06-07 DIAGNOSIS — I10 Essential (primary) hypertension: Secondary | ICD-10-CM

## 2020-06-07 DIAGNOSIS — E119 Type 2 diabetes mellitus without complications: Secondary | ICD-10-CM

## 2020-06-07 DIAGNOSIS — M5432 Sciatica, left side: Secondary | ICD-10-CM

## 2020-06-07 LAB — POCT GLYCOSYLATED HEMOGLOBIN (HGB A1C): Hemoglobin A1C: 6.5 % — AB (ref 4.0–5.6)

## 2020-06-07 NOTE — Patient Instructions (Signed)
° ° ° °  If you have lab work done today you will be contacted with your lab results within the next 2 weeks.  If you have not heard from us then please contact us. The fastest way to get your results is to register for My Chart. ° ° °IF you received an x-ray today, you will receive an invoice from [facility]. Please contact [facility] at [phone] with questions or concerns regarding your invoice.  ° °IF you received labwork today, you will receive an invoice from [facility]. Please contact [facility] at [phone] with questions or concerns regarding your invoice.  ° °Our billing staff will not be able to assist you with questions regarding bills from these companies. ° °You will be contacted with the lab results as soon as they are available. The fastest way to get your results is to activate your My Chart account. Instructions are located on the last page of this paperwork. If you have not heard from us regarding the results in 2 weeks, please contact this office. °  ° ° ° °

## 2020-06-08 ENCOUNTER — Encounter: Payer: Self-pay | Admitting: Registered Nurse

## 2020-06-08 MED ORDER — GABAPENTIN 100 MG PO CAPS: 100.0000 mg | ORAL_CAPSULE | Freq: Three times a day (TID) | ORAL | 3 refills | Status: DC

## 2020-06-08 MED ORDER — DULAGLUTIDE 0.75 MG/0.5ML ~~LOC~~ SOAJ: 0.7500 mg | SUBCUTANEOUS | 1 refills | Status: DC

## 2020-06-08 MED ORDER — GEMFIBROZIL 600 MG PO TABS: ORAL_TABLET | ORAL | 1 refills | Status: DC

## 2020-06-08 MED ORDER — ATORVASTATIN CALCIUM 40 MG PO TABS: 40.0000 mg | ORAL_TABLET | Freq: Every day | ORAL | 1 refills | Status: DC

## 2020-06-08 MED ORDER — LISINOPRIL 20 MG PO TABS: ORAL_TABLET | ORAL | 1 refills | Status: DC

## 2020-06-08 MED ORDER — METFORMIN HCL 500 MG PO TABS: 500.0000 mg | ORAL_TABLET | Freq: Two times a day (BID) | ORAL | 3 refills | Status: DC

## 2020-06-08 NOTE — Progress Notes (Signed)
Established Patient Office Visit  Subjective:  Patient ID: Cathy Robbins, female    DOB: [birth year]  Age: 64 y.o. MRN: [id number]  CC:  Chief Complaint  Patient presents with  . Medication Refill    Per patient she is here for an medication refill on Glimepiride. per patient she has no other concerns.    HPI Cathy Robbins presents for 3 mo follow up for t2dm  At last visit we started trulicity given ongoing a1c elevation Has been tolerating extremely well No complications Does not check home sugars.  No symptoms of hyper or hypoglycemia  Feeling well overall with no concerns.  Past Medical History:  Diagnosis Date  . Diabetes mellitus without complication (HCC)     No past surgical history on file.  No family history on file.  Social History   Socioeconomic History  . Marital status: Married    Spouse name: Not on file  . Number of children: 4  . Years of education: Not on file  . Highest education level: Not on file  Occupational History  . Not on file  Tobacco Use  . Smoking status: Never Smoker  . Smokeless tobacco: Never Used  Vaping Use  . Vaping Use: Never used  Substance and Sexual Activity  . Alcohol use: No  . Drug use: No  . Sexual activity: Not Currently  Other Topics Concern  . Not on file  Social History Narrative  . Not on file   Social Determinants of Health   Financial Resource Strain: Not on file  Food Insecurity: Not on file  Transportation Needs: Not on file  Physical Activity: Not on file  Stress: Not on file  Social Connections: Not on file  Intimate Partner Violence: Not on file    Outpatient Medications Prior to Visit  Medication Sig Dispense Refill  . Alcohol Swabs (ALCOHOL WIPES) 70 % PADS 1 application by Does not apply route 2 (two) times daily. 100 each 5  . atorvastatin (LIPITOR) 40 MG tablet Take 1 tablet (40 mg total) by mouth daily at 6 PM. 90 tablet 1  . Blood Glucose Calibration (CONTOUR NEXT CONTROL) Normal SOLN 1 drop by  In Vitro route as needed. 1 each 2  . Dulaglutide 0.75 MG/0.5ML SOPN Inject 0.5 mLs (0.75 mg total) into the skin once a week. 6 mL 0  . gabapentin (NEURONTIN) 100 MG capsule Take 1 capsule (100 mg total) by mouth 3 (three) times daily. 90 capsule 3  . Garlic 705 MG CAPS Take 1 capsule (705 mg total) by mouth daily. 60 capsule 4  . gemfibrozil (LOPID) 600 MG tablet TAKE 1 TABLET(600 MG) BY MOUTH TWICE DAILY BEFORE A MEAL 60 tablet 0  . glimepiride (AMARYL) 4 MG tablet TAKE 1 TABLET BY MOUTH EVERY DAY WITH BREAKFAST OR THE FIRST MAIN MEAL OF THE DAY 90 tablet 0  . lisinopril (ZESTRIL) 10 MG tablet TAKE 1 TABLET(10 MG) BY MOUTH DAILY 90 tablet 3  . metFORMIN (GLUCOPHAGE) 500 MG tablet Take 1 tablet (500 mg total) by mouth 2 (two) times daily with a meal. 180 tablet 3  . Omega-3 Fatty Acids (FISH OIL) 1000 MG CAPS Take by mouth.    . traMADol-acetaminophen (ULTRACET) 37.5-325 MG tablet Take 1 tablet by mouth every 6 (six) hours as needed. 30 tablet 0  . trolamine salicylate (ASPERCREME/ALOE) 10 % cream Apply 1 application topically as needed for muscle pain. 85 g 0   No facility-administered medications prior to visit.  No Known Allergies  ROS Review of Systems  Constitutional: Negative.   HENT: Negative.   Eyes: Negative.   Respiratory: Negative.   Cardiovascular: Negative.   Gastrointestinal: Negative.   Genitourinary: Negative.   Musculoskeletal: Negative.   Skin: Negative.   Neurological: Negative.   Psychiatric/Behavioral: Negative.       Objective:    Physical Exam Vitals and nursing note reviewed.  Constitutional:      General: She is not in acute distress.    Appearance: Normal appearance. She is normal weight. She is not ill-appearing, toxic-appearing or diaphoretic.  Cardiovascular:     Rate and Rhythm: Normal rate and regular rhythm.     Heart sounds: Normal heart sounds. No murmur heard. No friction rub. No gallop.   Pulmonary:     Effort: Pulmonary effort is  normal. No respiratory distress.     Breath sounds: Normal breath sounds. No stridor. No wheezing, rhonchi or rales.  Chest:     Chest wall: No tenderness.  Skin:    General: Skin is warm and dry.  Neurological:     General: No focal deficit present.     Mental Status: She is alert and oriented to person, place, and time. Mental status is at baseline.  Psychiatric:        Mood and Affect: Mood normal.        Behavior: Behavior normal.        Thought Content: Thought content normal.        Judgment: Judgment normal.     BP (!) 152/69   Pulse 69   Temp 97.6 F (36.4 C) (Temporal)   Resp 18   Ht 5' (1.524 m)   Wt 106 lb 12.8 oz (48.4 kg)   SpO2 98%   BMI 20.86 kg/m  Wt Readings from Last 3 Encounters:  06/07/20 106 lb 12.8 oz (48.4 kg)  03/08/20 110 lb 3.2 oz (50 kg)  07/12/19 113 lb (51.3 kg)     Health Maintenance Due  Topic Date Due  . OPHTHALMOLOGY EXAM  Never done  . MAMMOGRAM  Never done  . COVID-19 Vaccine (3 - Booster for Moderna series) 04/04/2020    There are no preventive care reminders to display for this patient.  Lab Results  Component Value Date   TSH 0.99 05/17/2016   Lab Results  Component Value Date   WBC 9.2 12/16/2017   HGB 11.7 (A) 12/16/2017   HCT 37.3 (A) 12/16/2017   MCV 74.4 (A) 12/16/2017   PLT 316 04/30/2015   Lab Results  Component Value Date   NA 140 01/12/2019   K 4.4 01/12/2019   CO2 21 01/12/2019   GLUCOSE 173 (H) 01/12/2019   BUN 15 01/12/2019   CREATININE 0.93 01/12/2019   BILITOT 0.4 01/12/2019   ALKPHOS 65 01/12/2019   AST 17 01/12/2019   ALT 13 01/12/2019   PROT 7.3 01/12/2019   ALBUMIN 4.1 01/12/2019   CALCIUM 9.5 01/12/2019   ANIONGAP 10 04/30/2015   Lab Results  Component Value Date   CHOL 182 07/12/2019   Lab Results  Component Value Date   HDL 41 07/12/2019   Lab Results  Component Value Date   LDLCALC 104 (H) 07/12/2019   Lab Results  Component Value Date   TRIG 217 (H) 07/12/2019   Lab  Results  Component Value Date   CHOLHDL 4.4 07/12/2019   Lab Results  Component Value Date   HGBA1C 6.5 (A) 06/07/2020  Assessment & Plan:   Problem List Items Addressed This Visit      Endocrine   Type 2 diabetes mellitus without complication, without long-term current use of insulin (HCC) - Primary   Relevant Medications   metFORMIN (GLUCOPHAGE) 500 MG tablet   atorvastatin (LIPITOR) 40 MG tablet   Dulaglutide 0.75 MG/0.5ML SOPN   lisinopril (ZESTRIL) 20 MG tablet   Other Relevant Orders   POCT glycosylated hemoglobin (Hb A1C) (Completed)    Other Visit Diagnoses    Elevated triglycerides with high cholesterol       Relevant Medications   atorvastatin (LIPITOR) 40 MG tablet   gemfibrozil (LOPID) 600 MG tablet   lisinopril (ZESTRIL) 20 MG tablet   Left sciatic nerve pain       Relevant Medications   gabapentin (NEURONTIN) 100 MG capsule   Essential hypertension       Relevant Medications   atorvastatin (LIPITOR) 40 MG tablet   gemfibrozil (LOPID) 600 MG tablet   lisinopril (ZESTRIL) 20 MG tablet      Meds ordered this encounter  Medications  . metFORMIN (GLUCOPHAGE) 500 MG tablet    Sig: Take 1 tablet (500 mg total) by mouth 2 (two) times daily with a meal.    Dispense:  180 tablet    Refill:  3    Order Specific Question:   Supervising Provider    Answer:   Neva Seat, JEFFREY R [[id number]]  . atorvastatin (LIPITOR) 40 MG tablet    Sig: Take 1 tablet (40 mg total) by mouth daily at 6 PM.    Dispense:  90 tablet    Refill:  1    Order Specific Question:   Supervising Provider    Answer:   Neva Seat, JEFFREY R [[id number]]  . gabapentin (NEURONTIN) 100 MG capsule    Sig: Take 1 capsule (100 mg total) by mouth 3 (three) times daily.    Dispense:  90 capsule    Refill:  3    Order Specific Question:   Supervising Provider    Answer:   Neva Seat, JEFFREY R [[id number]]  . Dulaglutide 0.75 MG/0.5ML SOPN    Sig: Inject 0.75 mg into the skin once a week.    Dispense:  6 mL     Refill:  1    Order Specific Question:   Supervising Provider    Answer:   Neva Seat, JEFFREY R [[id number]]  . gemfibrozil (LOPID) 600 MG tablet    Sig: TAKE 1 TABLET(600 MG) BY MOUTH TWICE DAILY BEFORE A MEAL    Dispense:  90 tablet    Refill:  1    Order Specific Question:   Supervising Provider    Answer:   Neva Seat, JEFFREY R [[id number]]  . lisinopril (ZESTRIL) 20 MG tablet    Sig: TAKE 1 TABLET(10 MG) BY MOUTH DAILY    Dispense:  90 tablet    Refill:  1    Pt wants to go back the 10 mg tabs. Can not tolerate the higher dose.    Order Specific Question:   Supervising Provider    Answer:   Meredith Staggers R [[id number]]    Follow-up: Return in about 6 months (around 12/06/2020) for t2dm.   Lab Results  Component Value Date   HGBA1C 6.5 (A) 06/07/2020    PLAN  Wonderfully, patient a1c has dropped to 6.5. reached appropriate control. Continue trulicity weekly, will stop amaryl to avoid risk for hypoglycemia.  Return in 6 mo - full labs at that time  Return sooner with concerns  Refills as ordered above  Patient encouraged to call clinic with any questions, comments, or concerns.  Cathy Ageeichard Krysten Veronica, NP

## 2020-06-10 ENCOUNTER — Other Ambulatory Visit: Payer: Self-pay | Admitting: Registered Nurse

## 2020-06-10 DIAGNOSIS — E119 Type 2 diabetes mellitus without complications: Secondary | ICD-10-CM

## 2020-06-18 ENCOUNTER — Other Ambulatory Visit: Payer: Self-pay | Admitting: Registered Nurse

## 2020-06-18 DIAGNOSIS — E782 Mixed hyperlipidemia: Secondary | ICD-10-CM

## 2020-06-19 ENCOUNTER — Other Ambulatory Visit: Payer: Self-pay | Admitting: Registered Nurse

## 2020-06-19 DIAGNOSIS — I10 Essential (primary) hypertension: Secondary | ICD-10-CM

## 2020-06-20 ENCOUNTER — Other Ambulatory Visit: Payer: Self-pay | Admitting: Registered Nurse

## 2020-06-20 DIAGNOSIS — E782 Mixed hyperlipidemia: Secondary | ICD-10-CM

## 2020-08-17 ENCOUNTER — Other Ambulatory Visit: Payer: Self-pay | Admitting: Registered Nurse

## 2020-08-17 DIAGNOSIS — E782 Mixed hyperlipidemia: Secondary | ICD-10-CM

## 2020-08-21 ENCOUNTER — Other Ambulatory Visit: Payer: Self-pay | Admitting: Registered Nurse

## 2020-08-21 DIAGNOSIS — E782 Mixed hyperlipidemia: Secondary | ICD-10-CM

## 2020-08-21 NOTE — Telephone Encounter (Signed)
Requested medication (s) are due for refill today - yes  Requested medication (s) are on the active medication list -yes  Future visit scheduled -no  Last refill: 05/12/20  Notes to clinic: Fails all lab protocol for RF of this medication- sent for review of request  Requested Prescriptions  Pending Prescriptions Disp Refills   gemfibrozil (LOPID) 600 MG tablet [Pharmacy Med Name: GEMFIBROZIL 600MG TABLETS] 60 tablet     Sig: TAKE 1 TABLET(600 MG) BY MOUTH TWICE DAILY BEFORE A MEAL      Cardiovascular:  Antilipid - Fibric Acid Derivatives Failed - 08/21/2020  9:57 AM      Failed - Total Cholesterol in normal range and within 360 days    Cholesterol, Total  Date Value Ref Range Status  07/12/2019 182 100 - 199 mg/dL Final          Failed - LDL in normal range and within 360 days    LDL Chol Calc (NIH)  Date Value Ref Range Status  07/12/2019 104 (H) 0 - 99 mg/dL Final          Failed - HDL in normal range and within 360 days    HDL  Date Value Ref Range Status  07/12/2019 41 >39 mg/dL Final          Failed - Triglycerides in normal range and within 360 days    Triglycerides  Date Value Ref Range Status  07/12/2019 217 (H) 0 - 149 mg/dL Final          Failed - ALT in normal range and within 180 days    ALT  Date Value Ref Range Status  01/12/2019 13 0 - 32 IU/L Final          Failed - AST in normal range and within 180 days    AST  Date Value Ref Range Status  01/12/2019 17 0 - 40 IU/L Final          Failed - Cr in normal range and within 180 days    Creat  Date Value Ref Range Status  05/17/2016 0.71 0.50 - 0.99 mg/dL Final    Comment:      For patients > or = 67 years of age: The upper reference limit for Creatinine is approximately 13% higher for people identified as African-American.      Creatinine, Ser  Date Value Ref Range Status  01/12/2019 0.93 0.57 - 1.00 mg/dL Final          Failed - eGFR in normal range and within 180 days    GFR,  Est African American  Date Value Ref Range Status  05/17/2016 >89 >=60 mL/min Final   GFR calc Af Amer  Date Value Ref Range Status  01/12/2019 75 >59 mL/min/1.73 Final   GFR, Est Non African American  Date Value Ref Range Status  05/17/2016 >89 >=60 mL/min Final   GFR calc non Af Amer  Date Value Ref Range Status  01/12/2019 65 >59 mL/min/1.73 Final          Passed - Valid encounter within last 12 months    Recent Outpatient Visits           2 months ago Type 2 diabetes mellitus without complication, without long-term current use of insulin ([place])   [facility] at [place], NP   5 months ago Type 2 diabetes mellitus without complication, without long-term current use of insulin ([place])   [facility] at [place], NP  1 year ago Type 2 diabetes mellitus without complication, without long-term current use of insulin ([place])   [facility], Delfino Lovett, NP   1 year ago Type 2 diabetes mellitus without complication, without long-term current use of insulin ([place])   [facility], Delfino Lovett, NP   2 years ago Hyperlipidemia, unspecified hyperlipidemia type   Primary Care at [facility], Gelene Mink, PA-C                 Refused Prescriptions Disp Refills   atorvastatin (LIPITOR) 40 MG tablet [Pharmacy Med Name: ATORVASTATIN 40MG  TABLETS] 90 tablet 1    Sig: TAKE 1 TABLET(40 MG) BY MOUTH DAILY AT 6 PM      Cardiovascular:  Antilipid - Statins Failed - 08/21/2020  9:57 AM      Failed - Total Cholesterol in normal range and within 360 days    Cholesterol, Total  Date Value Ref Range Status  07/12/2019 182 100 - 199 mg/dL Final          Failed - LDL in normal range and within 360 days    LDL Chol Calc (NIH)  Date Value Ref Range Status  07/12/2019 104 (H) 0 - 99 mg/dL Final          Failed - HDL in normal range and within 360 days    HDL  Date Value Ref Range Status  07/12/2019 41 >39 mg/dL Final           Failed - Triglycerides in normal range and within 360 days    Triglycerides  Date Value Ref Range Status  07/12/2019 217 (H) 0 - 149 mg/dL Final          Passed - Patient is not pregnant      Passed - Valid encounter within last 12 months    Recent Outpatient Visits           2 months ago Type 2 diabetes mellitus without complication, without long-term current use of insulin ([place])   [facility], Delfino Lovett, NP   5 months ago Type 2 diabetes mellitus without complication, without long-term current use of insulin ([place])   [facility], Delfino Lovett, NP   1 year ago Type 2 diabetes mellitus without complication, without long-term current use of insulin ([place])   [facility], Delfino Lovett, NP   1 year ago Type 2 diabetes mellitus without complication, without long-term current use of insulin ([place])   [facility], Delfino Lovett, NP   2 years ago Hyperlipidemia, unspecified hyperlipidemia type   Primary Care at [facility], Gelene Mink, [place]                    Requested Prescriptions  Pending Prescriptions Disp Refills   gemfibrozil (LOPID) 600 MG tablet [Pharmacy Med Name: GEMFIBROZIL 600MG  TABLETS] 60 tablet     Sig: TAKE 1 TABLET(600 MG) BY MOUTH TWICE DAILY BEFORE A MEAL      Cardiovascular:  Antilipid - Fibric Acid Derivatives Failed - 08/21/2020  9:57 AM      Failed - Total Cholesterol in normal range and within 360 days    Cholesterol, Total  Date Value Ref Range Status  07/12/2019 182 100 - 199 mg/dL Final          Failed - LDL in normal range and within 360 days    LDL Chol Calc (NIH)  Date Value Ref Range Status  07/12/2019  104 (H) 0 - 99 mg/dL Final          Failed - HDL in normal range and within 360 days    HDL  Date Value Ref Range Status  07/12/2019 41 >39 mg/dL Final          Failed - Triglycerides in normal range and within 360 days    Triglycerides  Date Value Ref Range Status   07/12/2019 217 (H) 0 - 149 mg/dL Final          Failed - ALT in normal range and within 180 days    ALT  Date Value Ref Range Status  01/12/2019 13 0 - 32 IU/L Final          Failed - AST in normal range and within 180 days    AST  Date Value Ref Range Status  01/12/2019 17 0 - 40 IU/L Final          Failed - Cr in normal range and within 180 days    Creat  Date Value Ref Range Status  05/17/2016 0.71 0.50 - 0.99 mg/dL Final    Comment:      For patients > or = 67 years of age: The upper reference limit for Creatinine is approximately 13% higher for people identified as African-American.      Creatinine, Ser  Date Value Ref Range Status  01/12/2019 0.93 0.57 - 1.00 mg/dL Final          Failed - eGFR in normal range and within 180 days    GFR, Est African American  Date Value Ref Range Status  05/17/2016 >89 >=60 mL/min Final   GFR calc Af Amer  Date Value Ref Range Status  01/12/2019 75 >59 mL/min/1.73 Final   GFR, Est Non African American  Date Value Ref Range Status  05/17/2016 >89 >=60 mL/min Final   GFR calc non Af Amer  Date Value Ref Range Status  01/12/2019 65 >59 mL/min/1.73 Final          Passed - Valid encounter within last 12 months    Recent Outpatient Visits           2 months ago Type 2 diabetes mellitus without complication, without long-term current use of insulin ([place])   [facility], Delfino Lovett, NP   5 months ago Type 2 diabetes mellitus without complication, without long-term current use of insulin ([place])   [facility], Delfino Lovett, NP   1 year ago Type 2 diabetes mellitus without complication, without long-term current use of insulin ([place])   [facility], Delfino Lovett, NP   1 year ago Type 2 diabetes mellitus without complication, without long-term current use of insulin ([place])   [facility], Delfino Lovett, NP   2 years ago Hyperlipidemia, unspecified hyperlipidemia type    Primary Care at [facility], Gelene Mink, PA-C                 Refused Prescriptions Disp Refills   atorvastatin (LIPITOR) 40 MG tablet [Pharmacy Med Name: ATORVASTATIN 40MG  TABLETS] 90 tablet 1    Sig: TAKE 1 TABLET(40 MG) BY MOUTH DAILY AT 6 PM      Cardiovascular:  Antilipid - Statins Failed - 08/21/2020  9:57 AM      Failed - Total Cholesterol in normal range and within 360 days    Cholesterol, Total  Date Value Ref Range Status  07/12/2019 182 100 - 199 mg/dL Final  Failed - LDL in normal range and within 360 days    LDL Chol Calc (NIH)  Date Value Ref Range Status  07/12/2019 104 (H) 0 - 99 mg/dL Final          Failed - HDL in normal range and within 360 days    HDL  Date Value Ref Range Status  07/12/2019 41 >39 mg/dL Final          Failed - Triglycerides in normal range and within 360 days    Triglycerides  Date Value Ref Range Status  07/12/2019 217 (H) 0 - 149 mg/dL Final          Passed - Patient is not pregnant      Passed - Valid encounter within last 12 months    Recent Outpatient Visits           2 months ago Type 2 diabetes mellitus without complication, without long-term current use of insulin ([place])   [facility], [place], NP   5 months ago Type 2 diabetes mellitus without complication, without long-term current use of insulin ([place])   [facility], [place], NP   1 year ago Type 2 diabetes mellitus without complication, without long-term current use of insulin ([place])   [facility], [place], NP   1 year ago Type 2 diabetes mellitus without complication, without long-term current use of insulin [facility])   [facility], [place], NP   2 years ago Hyperlipidemia, unspecified hyperlipidemia type   Primary Care at [facility], [place], [place]

## 2020-08-21 NOTE — Telephone Encounter (Signed)
Is the pt supposed to be on both cholesterol medications?

## 2020-08-21 NOTE — Telephone Encounter (Signed)
Patients husband came by to let us know that his wife needs refills on  atorvastatin (LIPITOR) 40 MG tablet [[id number] And  gemfibrozil (LOPID) 600 MG tablet [[id number]  [id number] - [place], [place] - [address] AT [facility] [address] [facility] [address]   Patient wants them called in she is out complete;y

## 2020-08-22 ENCOUNTER — Other Ambulatory Visit: Payer: Self-pay | Admitting: Registered Nurse

## 2020-08-22 DIAGNOSIS — E782 Mixed hyperlipidemia: Secondary | ICD-10-CM

## 2020-08-22 NOTE — Telephone Encounter (Signed)
Notes to clinic:  Requesting 90 day supply   Requested Prescriptions  Pending Prescriptions Disp Refills   gemfibrozil (LOPID) 600 MG tablet [Pharmacy Med Name: GEMFIBROZIL 600MG TABLETS] 180 tablet     Sig: TAKE 1 TABLET(600 MG) BY MOUTH TWICE DAILY BEFORE A MEAL      Cardiovascular:  Antilipid - Fibric Acid Derivatives Failed - 08/22/2020  8:04 AM      Failed - Total Cholesterol in normal range and within 360 days    Cholesterol, Total  Date Value Ref Range Status  07/12/2019 182 100 - 199 mg/dL Final          Failed - LDL in normal range and within 360 days    LDL Chol Calc (NIH)  Date Value Ref Range Status  07/12/2019 104 (H) 0 - 99 mg/dL Final          Failed - HDL in normal range and within 360 days    HDL  Date Value Ref Range Status  07/12/2019 41 >39 mg/dL Final          Failed - Triglycerides in normal range and within 360 days    Triglycerides  Date Value Ref Range Status  07/12/2019 217 (H) 0 - 149 mg/dL Final          Failed - ALT in normal range and within 180 days    ALT  Date Value Ref Range Status  01/12/2019 13 0 - 32 IU/L Final          Failed - AST in normal range and within 180 days    AST  Date Value Ref Range Status  01/12/2019 17 0 - 40 IU/L Final          Failed - Cr in normal range and within 180 days    Creat  Date Value Ref Range Status  05/17/2016 0.71 0.50 - 0.99 mg/dL Final    Comment:      For patients > or = 67 years of age: The upper reference limit for Creatinine is approximately 13% higher for people identified as African-American.      Creatinine, Ser  Date Value Ref Range Status  01/12/2019 0.93 0.57 - 1.00 mg/dL Final          Failed - eGFR in normal range and within 180 days    GFR, Est African American  Date Value Ref Range Status  05/17/2016 >89 >=60 mL/min Final   GFR calc Af Amer  Date Value Ref Range Status  01/12/2019 75 >59 mL/min/1.73 Final   GFR, Est Non African American  Date Value  Ref Range Status  05/17/2016 >89 >=60 mL/min Final   GFR calc non Af Amer  Date Value Ref Range Status  01/12/2019 65 >59 mL/min/1.73 Final          Passed - Valid encounter within last 12 months    Recent Outpatient Visits           2 months ago Type 2 diabetes mellitus without complication, without long-term current use of insulin ([place])   [facility], [place], NP   5 months ago Type 2 diabetes mellitus without complication, without long-term current use of insulin ([place])   [facility], [place], NP   1 year ago Type 2 diabetes mellitus without complication, without long-term current use of insulin [facility])   [facility], Delfino Lovett, NP   1 year ago Type 2 diabetes mellitus without complication, without  long-term current use of insulin [facility])   Primary Care at [place], NP   2 years ago Hyperlipidemia, unspecified hyperlipidemia type   Primary Care at [facility], Gelene Mink, [place]

## 2020-09-18 ENCOUNTER — Other Ambulatory Visit: Payer: Self-pay | Admitting: Emergency Medicine

## 2020-09-18 DIAGNOSIS — I10 Essential (primary) hypertension: Secondary | ICD-10-CM

## 2020-09-18 MED ORDER — LISINOPRIL 20 MG PO TABS: ORAL_TABLET | ORAL | 1 refills | Status: DC

## 2020-11-22 ENCOUNTER — Other Ambulatory Visit: Payer: Self-pay

## 2020-11-22 ENCOUNTER — Encounter: Payer: Self-pay | Admitting: Registered Nurse

## 2020-11-22 ENCOUNTER — Ambulatory Visit (INDEPENDENT_AMBULATORY_CARE_PROVIDER_SITE_OTHER): Payer: Medicare Other | Admitting: Registered Nurse

## 2020-11-22 VITALS — BP 128/60 | HR 73 | Temp 98.1°F | Ht 60.0 in | Wt 102.5 lb

## 2020-11-22 DIAGNOSIS — E119 Type 2 diabetes mellitus without complications: Secondary | ICD-10-CM

## 2020-11-22 LAB — POCT GLYCOSYLATED HEMOGLOBIN (HGB A1C): Hemoglobin A1C: 12.4 % — AB (ref 4.0–5.6)

## 2020-11-22 MED ORDER — TRULICITY 1.5 MG/0.5ML ~~LOC~~ SOAJ: 1.5000 mg | SUBCUTANEOUS | 0 refills | Status: DC

## 2020-11-22 NOTE — Progress Notes (Signed)
Established Patient Office Visit  Subjective:  Patient ID: Cathy Robbins, female    DOB: [birth year]  Age: 65 y.o. MRN: [id number]  CC:  Chief Complaint  Patient presents with  . Diabetes    Pt here for f/u checking sugars once a week, This AM was 228.    HPI Cathy Robbins presents for t2dm  Last A1c: 6.5 Currently taking: nothing - has run out of trulicity No new complications Reports good compliance with medications when available Diet has been steady Exercise habits have been limited, steady.   Past Medical History:  Diagnosis Date  . Diabetes mellitus without complication (HCC)     History reviewed. No pertinent surgical history.  History reviewed. No pertinent family history.  Social History   Socioeconomic History  . Marital status: Married    Spouse name: Not on file  . Number of children: 4  . Years of education: Not on file  . Highest education level: Not on file  Occupational History  . Not on file  Tobacco Use  . Smoking status: Never Smoker  . Smokeless tobacco: Never Used  Vaping Use  . Vaping Use: Never used  Substance and Sexual Activity  . Alcohol use: No  . Drug use: No  . Sexual activity: Not Currently  Other Topics Concern  . Not on file  Social History Narrative  . Not on file   Social Determinants of Health   Financial Resource Strain: Not on file  Food Insecurity: Not on file  Transportation Needs: Not on file  Physical Activity: Not on file  Stress: Not on file  Social Connections: Not on file  Intimate Partner Violence: Not on file    Outpatient Medications Prior to Visit  Medication Sig Dispense Refill  . Alcohol Swabs (ALCOHOL WIPES) 70 % PADS 1 application by Does not apply route 2 (two) times daily. 100 each 5  . atorvastatin (LIPITOR) 40 MG tablet Take 1 tablet (40 mg total) by mouth daily at 6 PM. 90 tablet 1  . Blood Glucose Calibration (CONTOUR NEXT CONTROL) Normal SOLN 1 drop by In Vitro route as needed. 1 each 2  .  gabapentin (NEURONTIN) 100 MG capsule Take 1 capsule (100 mg total) by mouth 3 (three) times daily. 90 capsule 3  . Garlic 1000 MG CAPS Take 1 capsule by mouth daily in the afternoon.    Marland Kitchen gemfibrozil (LOPID) 600 MG tablet TAKE 1 TABLET(600 MG) BY MOUTH TWICE DAILY BEFORE A MEAL 60 tablet 1  . lisinopril (ZESTRIL) 20 MG tablet TAKE 1 TABLET(10 MG) BY MOUTH DAILY 90 tablet 1  . Omega-3 Fatty Acids (FISH OIL) 1000 MG CAPS Take by mouth.    . traMADol-acetaminophen (ULTRACET) 37.5-325 MG tablet Take 1 tablet by mouth every 6 (six) hours as needed. 30 tablet 0  . Garlic 705 MG CAPS Take 1 capsule (705 mg total) by mouth daily. 60 capsule 4  . metFORMIN (GLUCOPHAGE) 500 MG tablet Take 1 tablet (500 mg total) by mouth 2 (two) times daily with a meal. 180 tablet 3  . trolamine salicylate (ASPERCREME/ALOE) 10 % cream Apply 1 application topically as needed for muscle pain. 85 g 0  . TRULICITY 0.75 MG/0.5ML SOPN ADMINISTER 0.75 MG UNDER THE SKIN 1 TIME A WEEK (Patient not taking: Reported on 11/22/2020) 6 mL 1   No facility-administered medications prior to visit.    No Known Allergies  ROS Review of Systems  Constitutional: Negative.   HENT: Negative.   Eyes: Negative.  Respiratory: Negative.   Cardiovascular: Negative.   Gastrointestinal: Negative.   Genitourinary: Negative.   Musculoskeletal: Negative.   Skin: Negative.   Neurological: Negative.   Psychiatric/Behavioral: Negative.   All other systems reviewed and are negative.     Objective:    Physical Exam Vitals and nursing note reviewed.  Constitutional:      General: She is not in acute distress.    Appearance: Normal appearance. She is normal weight. She is not ill-appearing, toxic-appearing or diaphoretic.  Cardiovascular:     Rate and Rhythm: Normal rate and regular rhythm.     Heart sounds: Normal heart sounds. No murmur heard. No friction rub. No gallop.   Pulmonary:     Effort: Pulmonary effort is normal. No  respiratory distress.     Breath sounds: Normal breath sounds. No stridor. No wheezing, rhonchi or rales.  Chest:     Chest wall: No tenderness.  Skin:    General: Skin is warm and dry.  Neurological:     General: No focal deficit present.     Mental Status: She is alert and oriented to person, place, and time. Mental status is at baseline.  Psychiatric:        Mood and Affect: Mood normal.        Behavior: Behavior normal.        Thought Content: Thought content normal.        Judgment: Judgment normal.     BP 128/60 (BP Location: Left Arm, Patient Position: Sitting, Cuff Size: Normal)   Pulse 73   Temp 98.1 F (36.7 C) (Temporal)   Ht 5' (1.524 m)   Wt 102 lb 8 oz (46.5 kg)   SpO2 97%   BMI 20.02 kg/m  Wt Readings from Last 3 Encounters:  11/22/20 102 lb 8 oz (46.5 kg)  06/07/20 106 lb 12.8 oz (48.4 kg)  03/08/20 110 lb 3.2 oz (50 kg)     Health Maintenance Due  Topic Date Due  . OPHTHALMOLOGY EXAM  Never done  . MAMMOGRAM  Never done  . COVID-19 Vaccine (3 - Booster for Moderna series) 03/05/2020    There are no preventive care reminders to display for this patient.  Lab Results  Component Value Date   TSH 0.99 05/17/2016   Lab Results  Component Value Date   WBC 9.2 12/16/2017   HGB 11.7 (A) 12/16/2017   HCT 37.3 (A) 12/16/2017   MCV 74.4 (A) 12/16/2017   PLT 316 04/30/2015   Lab Results  Component Value Date   NA 140 01/12/2019   K 4.4 01/12/2019   CO2 21 01/12/2019   GLUCOSE 173 (H) 01/12/2019   BUN 15 01/12/2019   CREATININE 0.93 01/12/2019   BILITOT 0.4 01/12/2019   ALKPHOS 65 01/12/2019   AST 17 01/12/2019   ALT 13 01/12/2019   PROT 7.3 01/12/2019   ALBUMIN 4.1 01/12/2019   CALCIUM 9.5 01/12/2019   ANIONGAP 10 04/30/2015   Lab Results  Component Value Date   CHOL 182 07/12/2019   Lab Results  Component Value Date   HDL 41 07/12/2019   Lab Results  Component Value Date   LDLCALC 104 (H) 07/12/2019   Lab Results  Component  Value Date   TRIG 217 (H) 07/12/2019   Lab Results  Component Value Date   CHOLHDL 4.4 07/12/2019   Lab Results  Component Value Date   HGBA1C 12.4 (A) 11/22/2020      Assessment & Plan:   Problem List  Items Addressed This Visit      Endocrine   Type 2 diabetes mellitus without complication, without long-term current use of insulin (HCC) - Primary   Relevant Medications   Dulaglutide (TRULICITY) 1.5 MG/0.5ML SOPN   Other Relevant Orders   POCT glycosylated hemoglobin (Hb A1C) (Completed)      Meds ordered this encounter  Medications  . Dulaglutide (TRULICITY) 1.5 MG/0.5ML SOPN    Sig: Inject 1.5 mg into the skin once a week.    Dispense:  6 mL    Refill:  0    Order Specific Question:   Supervising Provider    Answer:   Neva Seat, JEFFREY R [[id number]]    Follow-up: Return in about 3 months (around 02/22/2021) for T2dm.   PLAN  a1c up to 12.4 today. Drastic jump.   Tolerated trulicity well in past. Will resume at next higher dose of 1.5mg  weekly.  Continue to monitor sugars, continue to strive to improve diet and exercise.  Patient encouraged to call clinic with any questions, comments, or concerns.  Janeece Agee, NP

## 2020-12-11 ENCOUNTER — Telehealth (INDEPENDENT_AMBULATORY_CARE_PROVIDER_SITE_OTHER): Payer: Medicare Other | Admitting: Family Medicine

## 2020-12-11 ENCOUNTER — Other Ambulatory Visit: Payer: Self-pay

## 2020-12-11 ENCOUNTER — Encounter: Payer: Self-pay | Admitting: Family Medicine

## 2020-12-11 DIAGNOSIS — U071 COVID-19: Secondary | ICD-10-CM | POA: Insufficient documentation

## 2020-12-11 MED ORDER — LORATADINE 10 MG PO TABS: 10.0000 mg | ORAL_TABLET | Freq: Every day | ORAL | 11 refills | Status: DC

## 2020-12-11 MED ORDER — FLUTICASONE PROPIONATE 50 MCG/ACT NA SUSP: 2.0000 | Freq: Every day | NASAL | 6 refills | Status: DC

## 2020-12-11 NOTE — Assessment & Plan Note (Signed)
We need to get gfr before meds can be called in  Pt will have labs drawn in am and then we can send in paxlovid  Call if symptoms worsen

## 2020-12-11 NOTE — Progress Notes (Signed)
MyChart Video Visit    Virtual Visit via Video Note   This visit type was conducted due to national recommendations for restrictions regarding the COVID-19 Pandemic (e.g. social distancing) in an effort to limit this patient's exposure and mitigate transmission in our community. This patient is at least at moderate risk for complications without adequate follow up. This format is felt to be most appropriate for this patient at this time. Physical exam was limited by quality of the video and audio technology used for the visit. [name] was able to get the patient set up on a video visit.  Patient location: Cathy Robbins Patient and provider in visit Provider location: Office  I discussed the limitations of evaluation and management by telemedicine and the availability of in person appointments. The patient expressed understanding and agreed to proceed.  Visit Date: 12/11/2020  Today's healthcare provider: Donato Schultz, DO     Subjective:    Patient ID: Cathy Robbins, female    DOB: [birth year], 65 y.o.   MRN: [id number]  Chief Complaint  Patient presents with   Covid Positive    HPI Patient is in today for + covid today.   Pt had a fever last night -- they do not have  thermometer.   She has running nose dizziness and muscle aches .    We had to convert to telephone call due to pt not being to get video to work.   Past Medical History:  Diagnosis Date   Diabetes mellitus without complication (HCC)    No past surgical history on file.  No family history on file.  Social History   Socioeconomic History   Marital status: Married    Spouse name: Not on file   Number of children: 4   Years of education: Not on file   Highest education level: Not on file  Occupational History   Not on file  Tobacco Use   Smoking status: Never   Smokeless tobacco: Never  Vaping Use   Vaping Use: Never used  Substance and Sexual Activity   Alcohol use: No   Drug use: No   Sexual  activity: Not Currently  Other Topics Concern   Not on file  Social History Narrative   Not on file   Social Determinants of Health   Financial Resource Strain: Not on file  Food Insecurity: Not on file  Transportation Needs: Not on file  Physical Activity: Not on file  Stress: Not on file  Social Connections: Not on file  Intimate Partner Violence: Not on file    Outpatient Medications Prior to Visit  Medication Sig Dispense Refill   Alcohol Swabs (ALCOHOL WIPES) 70 % PADS 1 application by Does not apply route 2 (two) times daily. 100 each 5   atorvastatin (LIPITOR) 40 MG tablet Take 1 tablet (40 mg total) by mouth daily at 6 PM. 90 tablet 1   Blood Glucose Calibration (CONTOUR NEXT CONTROL) Normal SOLN 1 drop by In Vitro route as needed. 1 each 2   Dulaglutide (TRULICITY) 1.5 MG/0.5ML SOPN Inject 1.5 mg into the skin once a week. 6 mL 0   gabapentin (NEURONTIN) 100 MG capsule Take 1 capsule (100 mg total) by mouth 3 (three) times daily. 90 capsule 3   Garlic 1000 MG CAPS Take 1 capsule by mouth daily in the afternoon.     gemfibrozil (LOPID) 600 MG tablet TAKE 1 TABLET(600 MG) BY MOUTH TWICE DAILY BEFORE A MEAL 60 tablet 1  lisinopril (ZESTRIL) 20 MG tablet TAKE 1 TABLET(10 MG) BY MOUTH DAILY 90 tablet 1   Omega-3 Fatty Acids (FISH OIL) 1000 MG CAPS Take by mouth.     traMADol-acetaminophen (ULTRACET) 37.5-325 MG tablet Take 1 tablet by mouth every 6 (six) hours as needed. 30 tablet 0   No facility-administered medications prior to visit.    No Known Allergies  Review of Systems  HENT:  Negative for ear discharge and ear pain.   Eyes:  Negative for blurred vision, double vision, photophobia, pain and discharge.  Respiratory:  Negative for stridor.   Cardiovascular:  Negative for orthopnea.  Gastrointestinal:  Negative for blood in stool.  Genitourinary:  Negative for dysuria, frequency, hematuria and urgency.  Musculoskeletal:  Negative for falls.  Neurological:   Negative for tremors.  Endo/Heme/Allergies:  Negative for polydipsia.  Psychiatric/Behavioral:  Negative for memory loss.       Objective:    Physical Exam Constitutional:      General: She is not in acute distress.    Appearance: Normal appearance.  HENT:     Head: Normocephalic and atraumatic.  Pulmonary:     Effort: Pulmonary effort is normal.  Neurological:     Mental Status: She is alert.  Psychiatric:        Behavior: Behavior normal.        Thought Content: Thought content normal.    There were no vitals taken for this visit. Wt Readings from Last 3 Encounters:  11/22/20 102 lb 8 oz (46.5 kg)  06/07/20 106 lb 12.8 oz (48.4 kg)  03/08/20 110 lb 3.2 oz (50 kg)    Diabetic Foot Exam - Simple   No data filed    Lab Results  Component Value Date   WBC 9.2 12/16/2017   HGB 11.7 (A) 12/16/2017   HCT 37.3 (A) 12/16/2017   PLT 316 04/30/2015   GLUCOSE 173 (H) 01/12/2019   CHOL 182 07/12/2019   TRIG 217 (H) 07/12/2019   HDL 41 07/12/2019   LDLCALC 104 (H) 07/12/2019   ALT 13 01/12/2019   AST 17 01/12/2019   NA 140 01/12/2019   K 4.4 01/12/2019   CL 103 01/12/2019   CREATININE 0.93 01/12/2019   BUN 15 01/12/2019   CO2 21 01/12/2019   TSH 0.99 05/17/2016   HGBA1C 12.4 (A) 11/22/2020   MICROALBUR 0.8 01/12/2016    Lab Results  Component Value Date   TSH 0.99 05/17/2016   Lab Results  Component Value Date   WBC 9.2 12/16/2017   HGB 11.7 (A) 12/16/2017   HCT 37.3 (A) 12/16/2017   MCV 74.4 (A) 12/16/2017   PLT 316 04/30/2015   Lab Results  Component Value Date   NA 140 01/12/2019   K 4.4 01/12/2019   CO2 21 01/12/2019   GLUCOSE 173 (H) 01/12/2019   BUN 15 01/12/2019   CREATININE 0.93 01/12/2019   BILITOT 0.4 01/12/2019   ALKPHOS 65 01/12/2019   AST 17 01/12/2019   ALT 13 01/12/2019   PROT 7.3 01/12/2019   ALBUMIN 4.1 01/12/2019   CALCIUM 9.5 01/12/2019   ANIONGAP 10 04/30/2015   Lab Results  Component Value Date   CHOL 182 07/12/2019    Lab Results  Component Value Date   HDL 41 07/12/2019   Lab Results  Component Value Date   LDLCALC 104 (H) 07/12/2019   Lab Results  Component Value Date   TRIG 217 (H) 07/12/2019   Lab Results  Component Value Date  CHOLHDL 4.4 07/12/2019   Lab Results  Component Value Date   HGBA1C 12.4 (A) 11/22/2020       Assessment & Plan:   Problem List Items Addressed This Visit       Unprioritized   COVID-19 - Primary    We need to get gfr before meds can be called in  Pt will have labs drawn in am and then we can send in paxlovid  Call if symptoms worsen        Relevant Medications   loratadine (CLARITIN) 10 MG tablet   fluticasone (FLONASE) 50 MCG/ACT nasal spray   Other Relevant Orders   Comprehensive metabolic panel   MyChart COVID-19 home monitoring program   Temperature monitoring      Meds ordered this encounter  Medications   loratadine (CLARITIN) 10 MG tablet    Sig: Take 1 tablet (10 mg total) by mouth daily.    Dispense:  30 tablet    Refill:  11   fluticasone (FLONASE) 50 MCG/ACT nasal spray    Sig: Place 2 sprays into both nostrils daily.    Dispense:  16 g    Refill:  6    I discussed the assessment and treatment plan with the patient. The patient was provided an opportunity to ask questions and all were answered. The patient agreed with the plan and demonstrated an understanding of the instructions.   The patient was advised to call back or seek an in-person evaluation if the symptoms worsen or if the condition fails to improve as anticipated.  I provided 25 minutes of face-to-face time during this encounter.   Donato Schultz, DO [facility] [phone] (phone) [phone] (fax)  [facility]

## 2020-12-12 ENCOUNTER — Other Ambulatory Visit: Payer: Self-pay

## 2020-12-12 ENCOUNTER — Other Ambulatory Visit (INDEPENDENT_AMBULATORY_CARE_PROVIDER_SITE_OTHER): Payer: Medicare Other

## 2020-12-12 ENCOUNTER — Other Ambulatory Visit: Payer: Medicare Other

## 2020-12-12 DIAGNOSIS — U071 COVID-19: Secondary | ICD-10-CM | POA: Diagnosis not present

## 2020-12-13 LAB — CBC WITH DIFFERENTIAL/PLATELET
Basophils Absolute: 0.1 10*3/uL (ref 0.0–0.1)
Basophils Relative: 1.2 % (ref 0.0–3.0)
Eosinophils Absolute: 0.1 10*3/uL (ref 0.0–0.7)
Eosinophils Relative: 0.8 % (ref 0.0–5.0)
HCT: 36.9 % (ref 36.0–46.0)
Hemoglobin: 11.8 g/dL — ABNORMAL LOW (ref 12.0–15.0)
Lymphocytes Relative: 26.9 % (ref 12.0–46.0)
Lymphs Abs: 1.8 10*3/uL (ref 0.7–4.0)
MCHC: 32 g/dL (ref 30.0–36.0)
MCV: 74.1 fl — ABNORMAL LOW (ref 78.0–100.0)
Monocytes Absolute: 1 10*3/uL (ref 0.1–1.0)
Monocytes Relative: 15.1 % — ABNORMAL HIGH (ref 3.0–12.0)
Neutro Abs: 3.7 10*3/uL (ref 1.4–7.7)
Neutrophils Relative %: 56 % (ref 43.0–77.0)
Platelets: 283 10*3/uL (ref 150.0–400.0)
RBC: 4.98 Mil/uL (ref 3.87–5.11)
RDW: 13 % (ref 11.5–15.5)
WBC: 6.5 10*3/uL (ref 4.0–10.5)

## 2020-12-13 LAB — COMPREHENSIVE METABOLIC PANEL
ALT: 10 U/L (ref 0–35)
AST: 18 U/L (ref 0–37)
Albumin: 4.4 g/dL (ref 3.5–5.2)
Alkaline Phosphatase: 58 U/L (ref 39–117)
BUN: 20 mg/dL (ref 6–23)
CO2: 24 mEq/L (ref 19–32)
Calcium: 9.3 mg/dL (ref 8.4–10.5)
Chloride: 101 mEq/L (ref 96–112)
Creatinine, Ser: 1.02 mg/dL (ref 0.40–1.20)
GFR: 57.14 mL/min — ABNORMAL LOW (ref >60.00)
Glucose, Bld: 123 mg/dL — ABNORMAL HIGH (ref 70–99)
Potassium: 4.1 mEq/L (ref 3.5–5.1)
Sodium: 136 mEq/L (ref 135–145)
Total Bilirubin: 0.7 mg/dL (ref 0.2–1.2)
Total Protein: 8 g/dL (ref 6.0–8.3)

## 2020-12-15 ENCOUNTER — Telehealth: Payer: Self-pay

## 2020-12-15 NOTE — Telephone Encounter (Signed)
Is requesting call back in regard to lab results.

## 2020-12-15 NOTE — Telephone Encounter (Signed)
Patient seen at [facility] [address] and labs drawn at [facility]. No comments on labs as of today. Please advise. Patient COVID +

## 2020-12-18 ENCOUNTER — Encounter: Payer: Self-pay | Admitting: *Deleted

## 2021-02-23 ENCOUNTER — Other Ambulatory Visit: Payer: Self-pay | Admitting: Registered Nurse

## 2021-02-23 DIAGNOSIS — E782 Mixed hyperlipidemia: Secondary | ICD-10-CM

## 2021-02-27 ENCOUNTER — Other Ambulatory Visit: Payer: Self-pay | Admitting: Registered Nurse

## 2021-02-27 ENCOUNTER — Ambulatory Visit (INDEPENDENT_AMBULATORY_CARE_PROVIDER_SITE_OTHER): Payer: Medicare Other | Admitting: Registered Nurse

## 2021-02-27 ENCOUNTER — Other Ambulatory Visit: Payer: Self-pay

## 2021-02-27 ENCOUNTER — Encounter: Payer: Self-pay | Admitting: Registered Nurse

## 2021-02-27 VITALS — BP 130/70 | HR 75 | Temp 97.6°F | Resp 17 | Ht 60.0 in | Wt 107.2 lb

## 2021-02-27 DIAGNOSIS — E782 Mixed hyperlipidemia: Secondary | ICD-10-CM

## 2021-02-27 DIAGNOSIS — S86812A Strain of other muscle(s) and tendon(s) at lower leg level, left leg, initial encounter: Secondary | ICD-10-CM | POA: Diagnosis not present

## 2021-02-27 DIAGNOSIS — E119 Type 2 diabetes mellitus without complications: Secondary | ICD-10-CM | POA: Diagnosis not present

## 2021-02-27 DIAGNOSIS — Z23 Encounter for immunization: Secondary | ICD-10-CM | POA: Diagnosis not present

## 2021-02-27 LAB — CBC WITH DIFFERENTIAL/PLATELET
Basophils Absolute: 0 10*3/uL (ref 0.0–0.1)
Basophils Relative: 0.9 % (ref 0.0–3.0)
Eosinophils Absolute: 0.1 10*3/uL (ref 0.0–0.7)
Eosinophils Relative: 1.3 % (ref 0.0–5.0)
HCT: 34.5 % — ABNORMAL LOW (ref 36.0–46.0)
Hemoglobin: 10.9 g/dL — ABNORMAL LOW (ref 12.0–15.0)
Lymphocytes Relative: 39.8 % (ref 12.0–46.0)
Lymphs Abs: 2 10*3/uL (ref 0.7–4.0)
MCHC: 31.7 g/dL (ref 30.0–36.0)
MCV: 74.9 fl — ABNORMAL LOW (ref 78.0–100.0)
Monocytes Absolute: 0.2 10*3/uL (ref 0.1–1.0)
Monocytes Relative: 4 % (ref 3.0–12.0)
Neutro Abs: 2.8 10*3/uL (ref 1.4–7.7)
Neutrophils Relative %: 54 % (ref 43.0–77.0)
Platelets: 237 10*3/uL (ref 150.0–400.0)
RBC: 4.61 Mil/uL (ref 3.87–5.11)
RDW: 13.7 % (ref 11.5–15.5)
WBC: 5.1 10*3/uL (ref 4.0–10.5)

## 2021-02-27 LAB — LIPID PANEL
Cholesterol: 215 mg/dL — ABNORMAL HIGH (ref 0–200)
HDL: 39.5 mg/dL (ref >39.00)
Total CHOL/HDL Ratio: 5
Triglycerides: 449 mg/dL — ABNORMAL HIGH (ref 0.0–149.0)

## 2021-02-27 LAB — COMPREHENSIVE METABOLIC PANEL
ALT: 15 U/L (ref 0–35)
AST: 16 U/L (ref 0–37)
Albumin: 4.1 g/dL (ref 3.5–5.2)
Alkaline Phosphatase: 89 U/L (ref 39–117)
BUN: 16 mg/dL (ref 6–23)
CO2: 27 mEq/L (ref 19–32)
Calcium: 9.7 mg/dL (ref 8.4–10.5)
Chloride: 100 mEq/L (ref 96–112)
Creatinine, Ser: 0.86 mg/dL (ref 0.40–1.20)
GFR: 70.02 mL/min (ref >60.00)
Glucose, Bld: 232 mg/dL — ABNORMAL HIGH (ref 70–99)
Potassium: 4.7 mEq/L (ref 3.5–5.1)
Sodium: 135 mEq/L (ref 135–145)
Total Bilirubin: 0.8 mg/dL (ref 0.2–1.2)
Total Protein: 7.9 g/dL (ref 6.0–8.3)

## 2021-02-27 LAB — LDL CHOLESTEROL, DIRECT: Direct LDL: 49 mg/dL

## 2021-02-27 LAB — POCT GLYCOSYLATED HEMOGLOBIN (HGB A1C): Hemoglobin A1C: 8.2 % — AB (ref 4.0–5.6)

## 2021-02-27 MED ORDER — TRULICITY 1.5 MG/0.5ML ~~LOC~~ SOAJ: 1.5000 mg | SUBCUTANEOUS | 0 refills | Status: DC

## 2021-02-27 MED ORDER — CYCLOBENZAPRINE HCL 5 MG PO TABS: 5.0000 mg | ORAL_TABLET | Freq: Two times a day (BID) | ORAL | 1 refills | Status: DC | PRN

## 2021-02-27 NOTE — Patient Instructions (Addendum)
Ms. Furno -   Randie Heinz to see you.   You've done great with the sugars. Let's continue the current medication at this dose.  Last check was 12.4 - we were at 8.2 today - ideally below 7.0 next time I see you.  We will also recheck kidney function and cholesterol today to ensure these have not been impacted.   I have sent referral for mammogram   Flu shot given today  Call me if you need anything, otherwise see you in 3 months.   Thank you  Rich      C [place] -  R?t vui ???c g?p b?n.  B?n ? lm r?t t?t v?i ???ng. Hy ti?p t?c thu?c hi?n t?i v?i li?u l??ng ny.  L?n ki?m tra cu?i cng l 12,4 - hm nay chng ti ? m?c 8,2 - l t??ng l d??i 7,0 vo l?n t?i, ti g?p b?n.  Chng ti c?ng s? ki?m tra l?i ch?c n?ng th?n v cholesterol ngy hm nay ?? ??m b?o chng khng b? ?nh h??ng.  Ti ? g?i gi?y gi?i thi?u ?? ch?p X quang tuy?n v  ? tim phng cm hm nay  Hy g?i cho ti n?u b?n c?n b?t c? ?i?u g, n?u khng, hy g?p b?n sau 3 thng.  C?m ?n b?n   Rich

## 2021-02-27 NOTE — Progress Notes (Signed)
Established Patient Office Visit  Subjective:  Patient ID: Cathy Robbins, female    DOB: 1955-08-21  Age: 65 y.o. MRN: [id number]  CC:  Chief Complaint  Patient presents with   Diabetes    3 month recheck    HPI Tauriel Feazell presents for t2dm  Last A1c:  Lab Results  Component Value Date   HGBA1C 8.2 (A) 02/27/2021    Currently taking: none - trulicity not covered No new complications Reports good compliance with medications Diet has been improved since last visit Exercise habits have been limited   Notes some pain in L calf Ongoing Worse with activity No swelling or changes in color, nail growth, hair growth Walks 5 days each week - hurts with walking. Unilateral.  Past Medical History:  Diagnosis Date   Diabetes mellitus without complication (HCC)     History reviewed. No pertinent surgical history.  History reviewed. No pertinent family history.  Social History   Socioeconomic History   Marital status: Married    Spouse name: Not on file   Number of children: 4   Years of education: Not on file   Highest education level: Not on file  Occupational History   Not on file  Tobacco Use   Smoking status: Never   Smokeless tobacco: Never  Vaping Use   Vaping Use: Never used  Substance and Sexual Activity   Alcohol use: No   Drug use: No   Sexual activity: Not Currently  Other Topics Concern   Not on file  Social History Narrative   Not on file   Social Determinants of Health   Financial Resource Strain: Not on file  Food Insecurity: Not on file  Transportation Needs: Not on file  Physical Activity: Not on file  Stress: Not on file  Social Connections: Not on file  Intimate Partner Violence: Not on file    Outpatient Medications Prior to Visit  Medication Sig Dispense Refill   atorvastatin (LIPITOR) 40 MG tablet Take 1 tablet (40 mg total) by mouth daily at 6 PM. 90 tablet 1   Blood Glucose Calibration (CONTOUR NEXT CONTROL) Normal SOLN 1 drop by In  Vitro route as needed. 1 each 2   gabapentin (NEURONTIN) 100 MG capsule Take 1 capsule (100 mg total) by mouth 3 (three) times daily. 90 capsule 3   gemfibrozil (LOPID) 600 MG tablet TAKE 1 TABLET(600 MG) BY MOUTH TWICE DAILY BEFORE A MEAL 60 tablet 1   lisinopril (ZESTRIL) 20 MG tablet TAKE 1 TABLET(10 MG) BY MOUTH DAILY 90 tablet 1   loratadine (CLARITIN) 10 MG tablet Take 1 tablet (10 mg total) by mouth daily. 30 tablet 11   traMADol-acetaminophen (ULTRACET) 37.5-325 MG tablet Take 1 tablet by mouth every 6 (six) hours as needed. 30 tablet 0   Alcohol Swabs (ALCOHOL WIPES) 70 % PADS 1 application by Does not apply route 2 (two) times daily. (Patient not taking: Reported on 02/27/2021) 100 each 5   Dulaglutide (TRULICITY) 1.5 MG/0.5ML SOPN Inject 1.5 mg into the skin once a week. (Patient not taking: Reported on 02/27/2021) 6 mL 0   fluticasone (FLONASE) 50 MCG/ACT nasal spray Place 2 sprays into both nostrils daily. (Patient not taking: Reported on 02/27/2021) 16 g 6   Garlic 1000 MG CAPS Take 1 capsule by mouth daily in the afternoon. (Patient not taking: Reported on 02/27/2021)     Omega-3 Fatty Acids (FISH OIL) 1000 MG CAPS Take by mouth. (Patient not taking: Reported on 02/27/2021)  No facility-administered medications prior to visit.    No Known Allergies  ROS Review of Systems  Constitutional: Negative.   HENT: Negative.    Eyes: Negative.   Respiratory: Negative.    Cardiovascular: Negative.   Gastrointestinal: Negative.   Genitourinary: Negative.   Musculoskeletal: Negative.   Skin: Negative.   Neurological: Negative.   Psychiatric/Behavioral: Negative.    All other systems reviewed and are negative.    Objective:    Physical Exam Vitals and nursing note reviewed.  Constitutional:      General: She is not in acute distress.    Appearance: Normal appearance. She is normal weight. She is not ill-appearing, toxic-appearing or diaphoretic.  Cardiovascular:     Rate and  Rhythm: Normal rate and regular rhythm.     Heart sounds: Normal heart sounds. No murmur heard.   No friction rub. No gallop.  Pulmonary:     Effort: Pulmonary effort is normal. No respiratory distress.     Breath sounds: Normal breath sounds. No stridor. No wheezing, rhonchi or rales.  Chest:     Chest wall: No tenderness.  Musculoskeletal:        General: Tenderness (mild calf) present. No swelling, deformity or signs of injury. Normal range of motion.     Right lower leg: No edema.     Left lower leg: No edema.  Skin:    General: Skin is warm and dry.     Capillary Refill: Capillary refill takes less than 2 seconds.  Neurological:     General: No focal deficit present.     Mental Status: She is alert and oriented to person, place, and time. Mental status is at baseline.  Psychiatric:        Mood and Affect: Mood normal.        Behavior: Behavior normal.        Thought Content: Thought content normal.        Judgment: Judgment normal.    BP 130/70   Pulse 75   Temp 97.6 F (36.4 C) (Temporal)   Resp 17   Ht 5' (1.524 m)   Wt 107 lb 3.2 oz (48.6 kg)   SpO2 99%   BMI 20.94 kg/m  Wt Readings from Last 3 Encounters:  02/27/21 107 lb 3.2 oz (48.6 kg)  11/22/20 102 lb 8 oz (46.5 kg)  06/07/20 106 lb 12.8 oz (48.4 kg)     Health Maintenance Due  Topic Date Due   OPHTHALMOLOGY EXAM  Never done   MAMMOGRAM  Never done   Zoster Vaccines- Shingrix (1 of 2) Never done   COVID-19 Vaccine (3 - Booster for Moderna series) 03/05/2020   INFLUENZA VACCINE  01/29/2021    There are no preventive care reminders to display for this patient.  Lab Results  Component Value Date   TSH 0.99 05/17/2016   Lab Results  Component Value Date   WBC 6.5 12/12/2020   HGB 11.8 (L) 12/12/2020   HCT 36.9 12/12/2020   MCV 74.1 (L) 12/12/2020   PLT 283.0 12/12/2020   Lab Results  Component Value Date   NA 136 12/12/2020   K 4.1 12/12/2020   CO2 24 12/12/2020   GLUCOSE 123 (H)  12/12/2020   BUN 20 12/12/2020   CREATININE 1.02 12/12/2020   BILITOT 0.7 12/12/2020   ALKPHOS 58 12/12/2020   AST 18 12/12/2020   ALT 10 12/12/2020   PROT 8.0 12/12/2020   ALBUMIN 4.4 12/12/2020   CALCIUM 9.3 12/12/2020  ANIONGAP 10 04/30/2015   GFR 57.14 (L) 12/12/2020   Lab Results  Component Value Date   CHOL 182 07/12/2019   Lab Results  Component Value Date   HDL 41 07/12/2019   Lab Results  Component Value Date   LDLCALC 104 (H) 07/12/2019   Lab Results  Component Value Date   TRIG 217 (H) 07/12/2019   Lab Results  Component Value Date   CHOLHDL 4.4 07/12/2019   Lab Results  Component Value Date   HGBA1C 8.2 (A) 02/27/2021      Assessment & Plan:   Problem List Items Addressed This Visit       Endocrine   Type 2 diabetes mellitus without complication, without long-term current use of insulin (HCC) - Primary   Relevant Medications   Dulaglutide (TRULICITY) 1.5 MG/0.5ML SOPN   Other Relevant Orders   Ambulatory referral to Ophthalmology   CBC with Differential/Platelet   Comprehensive metabolic panel   Lipid panel   POCT HgB A1C (Completed)   MM Digital Screening   Other Visit Diagnoses     Strain of left calf muscle       Relevant Medications   cyclobenzaprine (FLEXERIL) 5 MG tablet       Meds ordered this encounter  Medications   cyclobenzaprine (FLEXERIL) 5 MG tablet    Sig: Take 1 tablet (5 mg total) by mouth 2 (two) times daily as needed for muscle spasms.    Dispense:  30 tablet    Refill:  1    Order Specific Question:   Supervising Provider    Answer:   Neva Seat, JEFFREY R [[id number]]   Dulaglutide (TRULICITY) 1.5 MG/0.5ML SOPN    Sig: Inject 1.5 mg into the skin once a week.    Dispense:  6 mL    Refill:  0    Order Specific Question:   Supervising Provider    Answer:   Neva Seat, JEFFREY R [[id number]]    Follow-up: Return in about 3 months (around 05/30/2021) for t2dm.   PLAN Flu shot given today Mammo order sent Refill trulicity  at 1.5 mg weekly Great improvement of sugar down to 8.2 . Ideal control below 7.0.  Return in 3 mo to recheck Patient encouraged to call clinic with any questions, comments, or concerns.  Janeece Agee, NP

## 2021-02-28 ENCOUNTER — Other Ambulatory Visit: Payer: Self-pay | Admitting: Registered Nurse

## 2021-02-28 DIAGNOSIS — E782 Mixed hyperlipidemia: Secondary | ICD-10-CM

## 2021-03-02 ENCOUNTER — Encounter: Payer: Self-pay | Admitting: Registered Nurse

## 2021-03-19 ENCOUNTER — Other Ambulatory Visit: Payer: Self-pay | Admitting: Registered Nurse

## 2021-03-19 DIAGNOSIS — I10 Essential (primary) hypertension: Secondary | ICD-10-CM

## 2021-04-07 ENCOUNTER — Other Ambulatory Visit: Payer: Self-pay | Admitting: Registered Nurse

## 2021-04-07 DIAGNOSIS — M5432 Sciatica, left side: Secondary | ICD-10-CM

## 2021-04-17 ENCOUNTER — Other Ambulatory Visit: Payer: Self-pay | Admitting: Registered Nurse

## 2021-04-17 DIAGNOSIS — I10 Essential (primary) hypertension: Secondary | ICD-10-CM

## 2021-04-23 NOTE — Telephone Encounter (Signed)
Have discussed labs with patient  Thank you  Luan Pulling

## 2021-05-02 ENCOUNTER — Other Ambulatory Visit: Payer: Self-pay | Admitting: Registered Nurse

## 2021-05-02 DIAGNOSIS — E782 Mixed hyperlipidemia: Secondary | ICD-10-CM

## 2021-05-19 ENCOUNTER — Other Ambulatory Visit: Payer: Self-pay | Admitting: Registered Nurse

## 2021-05-19 DIAGNOSIS — S86812A Strain of other muscle(s) and tendon(s) at lower leg level, left leg, initial encounter: Secondary | ICD-10-CM

## 2021-05-29 ENCOUNTER — Ambulatory Visit (INDEPENDENT_AMBULATORY_CARE_PROVIDER_SITE_OTHER): Payer: Medicare Other | Admitting: Registered Nurse

## 2021-05-29 ENCOUNTER — Encounter: Payer: Self-pay | Admitting: Registered Nurse

## 2021-05-29 VITALS — BP 122/74 | HR 86 | Temp 98.2°F | Resp 16 | Ht 60.0 in | Wt 104.4 lb

## 2021-05-29 DIAGNOSIS — E782 Mixed hyperlipidemia: Secondary | ICD-10-CM

## 2021-05-29 DIAGNOSIS — I10 Essential (primary) hypertension: Secondary | ICD-10-CM

## 2021-05-29 DIAGNOSIS — M79662 Pain in left lower leg: Secondary | ICD-10-CM

## 2021-05-29 DIAGNOSIS — S86812A Strain of other muscle(s) and tendon(s) at lower leg level, left leg, initial encounter: Secondary | ICD-10-CM | POA: Diagnosis not present

## 2021-05-29 DIAGNOSIS — M5432 Sciatica, left side: Secondary | ICD-10-CM

## 2021-05-29 DIAGNOSIS — E119 Type 2 diabetes mellitus without complications: Secondary | ICD-10-CM | POA: Diagnosis not present

## 2021-05-29 LAB — POCT GLYCOSYLATED HEMOGLOBIN (HGB A1C): Hemoglobin A1C: 11.3 % — AB (ref 4.0–5.6)

## 2021-05-29 MED ORDER — CYCLOBENZAPRINE HCL 5 MG PO TABS: ORAL_TABLET | ORAL | 1 refills | Status: DC

## 2021-05-29 MED ORDER — PREGABALIN 75 MG PO CAPS: 75.0000 mg | ORAL_CAPSULE | Freq: Two times a day (BID) | ORAL | 0 refills | Status: DC

## 2021-05-29 MED ORDER — SEMAGLUTIDE (2 MG/DOSE) 8 MG/3ML ~~LOC~~ SOPN: 2.0000 mg | PEN_INJECTOR | SUBCUTANEOUS | 0 refills | Status: DC

## 2021-05-29 MED ORDER — LISINOPRIL 20 MG PO TABS: 20.0000 mg | ORAL_TABLET | Freq: Every day | ORAL | 1 refills | Status: DC

## 2021-05-29 MED ORDER — ATORVASTATIN CALCIUM 40 MG PO TABS: 40.0000 mg | ORAL_TABLET | Freq: Every day | ORAL | 1 refills | Status: DC

## 2021-05-29 MED ORDER — DULAGLUTIDE 3 MG/0.5ML ~~LOC~~ SOAJ: 3.0000 mg | SUBCUTANEOUS | 0 refills | Status: DC

## 2021-05-29 MED ORDER — PREGABALIN 75 MG PO CAPS
75.0000 mg | ORAL_CAPSULE | Freq: Two times a day (BID) | ORAL | 0 refills | Status: DC
Start: 2021-05-29 — End: 2021-05-29

## 2021-05-29 MED ORDER — GABAPENTIN 100 MG PO CAPS: 100.0000 mg | ORAL_CAPSULE | Freq: Three times a day (TID) | ORAL | 1 refills | Status: DC

## 2021-05-29 NOTE — Progress Notes (Signed)
Established Patient Office Visit  Subjective:  Patient ID: Cathy Robbins, female    DOB: [birth year]  Age: 65 y.o. MRN: [id number]  CC:  Chief Complaint  Patient presents with   Diabetes    Pt reports Gabapentin causes drowsy side effects pt wondered if there is an alternative    Leg Pain    Pt reports several years of pain in her Lt leg, denies injury, stays in her calf     HPI Cathy Robbins presents for t2dm  Last A1c:  Lab Results  Component Value Date   HGBA1C 8.2 (A) 02/27/2021    Currently taking: dulaglutide 1.5mg  subq weekly No new complications Reports poor compliance due to cost Diet has been improved over the past year Exercise habits have been steady  Hypertension: Patient Currently taking: lisinopril  po qd Good effect. No AEs. Denies CV symptoms including: chest pain, shob, doe, headache, visual changes, fatigue, claudication, and dependent edema.   Previous readings and labs: BP Readings from Last 3 Encounters:  05/29/21 122/74  02/27/21 130/70  11/22/20 128/60   Lab Results  Component Value Date   CREATININE 0.86 02/27/2021    Hld Taking atorvastatin  po qd Fair effect, no AE Lab Results  Component Value Date   CHOL 215 (H) 02/27/2021   HDL 39.50 02/27/2021   LDLCALC 104 (H) 07/12/2019   LDLDIRECT 49.0 02/27/2021   TRIG (H) 02/27/2021    449.0 Triglyceride is over 400; calculations on Lipids are invalid.   CHOLHDL 5 02/27/2021   L sciatic pain Taking gabapentin  po tid prn Notes sedation as AE despite good effect Interested in alternatives to this. Pain in calf ongoing when not using gabapentin Worse when walking   Past Medical History:  Diagnosis Date   Diabetes mellitus without complication (HCC)     History reviewed. No pertinent surgical history.  History reviewed. No pertinent family history.  Social History   Socioeconomic History   Marital status: Married    Spouse name: Not on file   Number of children: 4   Years  of education: Not on file   Highest education level: Not on file  Occupational History   Not on file  Tobacco Use   Smoking status: Never   Smokeless tobacco: Never  Vaping Use   Vaping Use: Never used  Substance and Sexual Activity   Alcohol use: No   Drug use: No   Sexual activity: Not Currently  Other Topics Concern   Not on file  Social History Narrative   Not on file   Social Determinants of Health   Financial Resource Strain: Not on file  Food Insecurity: Not on file  Transportation Needs: Not on file  Physical Activity: Not on file  Stress: Not on file  Social Connections: Not on file  Intimate Partner Violence: Not on file    Outpatient Medications Prior to Visit  Medication Sig Dispense Refill   Blood Glucose Calibration (CONTOUR NEXT CONTROL) Normal SOLN 1 drop by In Vitro route as needed. 1 each 2   gemfibrozil (LOPID) 600 MG tablet TAKE 1 TABLET(600 MG) BY MOUTH TWICE DAILY BEFORE A MEAL. 180 tablet 0   loratadine (CLARITIN) 10 MG tablet Take 1 tablet (10 mg total) by mouth daily. 30 tablet 11   traMADol-acetaminophen (ULTRACET) 37.5-325 MG tablet Take 1 tablet by mouth every 6 (six) hours as needed. 30 tablet 0   atorvastatin (LIPITOR) 40 MG tablet TAKE 1 TABLET(40 MG) BY MOUTH DAILY AT 6  PM 90 tablet 1   cyclobenzaprine (FLEXERIL) 5 MG tablet TAKE 1 TABLET(5 MG) BY MOUTH TWICE DAILY AS NEEDED FOR MUSCLE SPASMS 30 tablet 1   Dulaglutide (TRULICITY) 1.5 MG/0.5ML SOPN Inject 1.5 mg into the skin once a week. 6 mL 0   gabapentin (NEURONTIN) 100 MG capsule TAKE 1 CAPSULE(100 MG) BY MOUTH THREE TIMES DAILY 90 capsule 3   lisinopril (ZESTRIL) 20 MG tablet TAKE 1 TABLET BY MOUTH DAILY 90 tablet 1   No facility-administered medications prior to visit.    No Known Allergies  ROS Review of Systems  Constitutional: Negative.   HENT: Negative.    Eyes: Negative.   Respiratory: Negative.    Cardiovascular: Negative.   Gastrointestinal: Negative.   Genitourinary:  Negative.   Musculoskeletal: Negative.   Skin: Negative.   Neurological: Negative.   Psychiatric/Behavioral: Negative.    All other systems reviewed and are negative.    Objective:    Physical Exam Vitals and nursing note reviewed.  Constitutional:      General: She is not in acute distress.    Appearance: Normal appearance. She is normal weight. She is not ill-appearing, toxic-appearing or diaphoretic.  Cardiovascular:     Rate and Rhythm: Normal rate and regular rhythm.     Heart sounds: Normal heart sounds. No murmur heard.   No friction rub. No gallop.  Pulmonary:     Effort: Pulmonary effort is normal. No respiratory distress.     Breath sounds: Normal breath sounds. No stridor. No wheezing, rhonchi or rales.  Chest:     Chest wall: No tenderness.  Skin:    General: Skin is warm and dry.  Neurological:     General: No focal deficit present.     Mental Status: She is alert and oriented to person, place, and time. Mental status is at baseline.  Psychiatric:        Mood and Affect: Mood normal.        Behavior: Behavior normal.        Thought Content: Thought content normal.        Judgment: Judgment normal.    BP 122/74   Pulse 86   Temp 98.2 F (36.8 C) (Temporal)   Resp 16   Ht 5' (1.524 m)   Wt 104 lb 6.4 oz (47.4 kg)   SpO2 99%   BMI 20.39 kg/m  Wt Readings from Last 3 Encounters:  05/29/21 104 lb 6.4 oz (47.4 kg)  02/27/21 107 lb 3.2 oz (48.6 kg)  11/22/20 102 lb 8 oz (46.5 kg)     Health Maintenance Due  Topic Date Due   OPHTHALMOLOGY EXAM  Never done   TETANUS/TDAP  Never done   MAMMOGRAM  Never done   Zoster Vaccines- Shingrix (1 of 2) Never done   Pneumonia Vaccine 28+ Years old (2 - PCV) 01/11/2017   DEXA SCAN  Never done    There are no preventive care reminders to display for this patient.  Lab Results  Component Value Date   TSH 0.99 05/17/2016   Lab Results  Component Value Date   WBC 5.1 02/27/2021   HGB 10.9 (L) 02/27/2021    HCT 34.5 (L) 02/27/2021   MCV 74.9 (L) 02/27/2021   PLT 237.0 02/27/2021   Lab Results  Component Value Date   NA 135 02/27/2021   K 4.7 02/27/2021   CO2 27 02/27/2021   GLUCOSE 232 (H) 02/27/2021   BUN 16 02/27/2021   CREATININE 0.86 02/27/2021  BILITOT 0.8 02/27/2021   ALKPHOS 89 02/27/2021   AST 16 02/27/2021   ALT 15 02/27/2021   PROT 7.9 02/27/2021   ALBUMIN 4.1 02/27/2021   CALCIUM 9.7 02/27/2021   ANIONGAP 10 04/30/2015   GFR 70.02 02/27/2021   Lab Results  Component Value Date   CHOL 215 (H) 02/27/2021   Lab Results  Component Value Date   HDL 39.50 02/27/2021   Lab Results  Component Value Date   LDLCALC 104 (H) 07/12/2019   Lab Results  Component Value Date   TRIG (H) 02/27/2021    449.0 Triglyceride is over 400; calculations on Lipids are invalid.   Lab Results  Component Value Date   CHOLHDL 5 02/27/2021   Lab Results  Component Value Date   HGBA1C 8.2 (A) 02/27/2021      Assessment & Plan:   Problem List Items Addressed This Visit       Endocrine   Type 2 diabetes mellitus without complication, without long-term current use of insulin (HCC) - Primary   Relevant Medications   atorvastatin (LIPITOR) 40 MG tablet   lisinopril (ZESTRIL) 20 MG tablet   Semaglutide, 2 MG/DOSE, 8 MG/3ML SOPN   Other Relevant Orders   POCT HgB A1C   Other Visit Diagnoses     Elevated triglycerides with high cholesterol       Relevant Medications   atorvastatin (LIPITOR) 40 MG tablet   lisinopril (ZESTRIL) 20 MG tablet   Left sciatic nerve pain       Relevant Medications   pregabalin (LYRICA) 75 MG capsule   cyclobenzaprine (FLEXERIL) 5 MG tablet   Essential hypertension       Relevant Medications   atorvastatin (LIPITOR) 40 MG tablet   lisinopril (ZESTRIL) 20 MG tablet   Strain of left calf muscle       Relevant Medications   cyclobenzaprine (FLEXERIL) 5 MG tablet   Pain in left lower leg       Relevant Orders   VAS [place] LOWER EXTREMITY VENOUS  (DVT)       Meds ordered this encounter  Medications   DISCONTD: gabapentin (NEURONTIN) 100 MG capsule    Sig: Take 1 capsule (100 mg total) by mouth 3 (three) times daily.    Dispense:  270 capsule    Refill:  1    Order Specific Question:   Supervising Provider    Answer:   Neva Seat, JEFFREY R [[id number]]   DISCONTD: lisinopril (ZESTRIL) 20 MG tablet    Sig: Take 1 tablet (20 mg total) by mouth daily.    Dispense:  90 tablet    Refill:  1    Order Specific Question:   Supervising Provider    Answer:   Neva Seat, JEFFREY R [[id number]]   DISCONTD: atorvastatin (LIPITOR) 40 MG tablet    Sig: Take 1 tablet (40 mg total) by mouth daily at 6 PM.    Dispense:  90 tablet    Refill:  1    Order Specific Question:   Supervising Provider    Answer:   Neva Seat, JEFFREY R [[id number]]   DISCONTD: Dulaglutide 3 MG/0.5ML SOPN    Sig: Inject 3 mg into the skin once a week.    Dispense:  6 mL    Refill:  0    Order Specific Question:   Supervising Provider    Answer:   Neva Seat, JEFFREY R [[id number]]   DISCONTD: pregabalin (LYRICA) 75 MG capsule    Sig: Take 1 capsule (75 mg total)  by mouth 2 (two) times daily.    Dispense:  180 capsule    Refill:  0    Order Specific Question:   Supervising Provider    Answer:   Neva Seat, JEFFREY R [[id number]]   DISCONTD: cyclobenzaprine (FLEXERIL) 5 MG tablet    Sig: TAKE 1 TABLET(5 MG) BY MOUTH TWICE DAILY AS NEEDED FOR MUSCLE SPASMS    Dispense:  30 tablet    Refill:  1    Order Specific Question:   Supervising Provider    Answer:   Neva Seat, JEFFREY R [[id number]]   pregabalin (LYRICA) 75 MG capsule    Sig: Take 1 capsule (75 mg total) by mouth 2 (two) times daily.    Dispense:  180 capsule    Refill:  0    Order Specific Question:   Supervising Provider    Answer:   Neva Seat, JEFFREY R [[id number]]   atorvastatin (LIPITOR) 40 MG tablet    Sig: Take 1 tablet (40 mg total) by mouth daily at 6 PM.    Dispense:  90 tablet    Refill:  1    Order Specific Question:   Supervising Provider     Answer:   Neva Seat, JEFFREY R [[id number]]   lisinopril (ZESTRIL) 20 MG tablet    Sig: Take 1 tablet (20 mg total) by mouth daily.    Dispense:  90 tablet    Refill:  1    Order Specific Question:   Supervising Provider    Answer:   Neva Seat, JEFFREY R [[id number]]   cyclobenzaprine (FLEXERIL) 5 MG tablet    Sig: TAKE 1 TABLET(5 MG) BY MOUTH TWICE DAILY AS NEEDED FOR MUSCLE SPASMS    Dispense:  30 tablet    Refill:  1    Order Specific Question:   Supervising Provider    Answer:   Neva Seat, JEFFREY R [[id number]]   Semaglutide, 2 MG/DOSE, 8 MG/3ML SOPN    Sig: Inject 2 mg into the skin once a week.    Dispense:  9 mL    Refill:  0    Order Specific Question:   Supervising Provider    Answer:   Neva Seat, JEFFREY R [[id number]]    Follow-up: Return if symptoms worsen or fail to improve, for as scheduled with new provider.   PLAN A1c today at 11.3 Plan to switch to semaglutide 2mg  subq weekly. Return in 3 mo for check on A1c, repeat lipids and labs Patient encouraged to call clinic with any questions, comments, or concerns.  , NP

## 2021-05-29 NOTE — Patient Instructions (Addendum)
Ms. Lesueur -   It has been a pleasure working with you. I would recommend going to see one of my [place] colleagues for continuity of care:  Dr. Edwina Barth at [facility]  I have scheduled you an appt with him on Feb 16 at 8:00am.   Call me with any concerns in the mean time, I would be happy to help!   Thank you  Rich         C M -  Th?t Vinh ha?nh ????c lam vi?c v?i ba?n. Ti khuyn b?n nn ??n g?p m?t trong nh?ng ??ng nghi?p [place] c?a ti ?? ???c ch?m [place] lin t?c:  Ti?n s? Miguel Sagardia t?i  [place]  Ti ? ln l?ch cho b?n m?t cu?c h?n v?i anh ?y vo ngy 16 thng 2 lc 8:00 sng.  Hy g?i cho ti n?u c b?t k? m?i quan tm no trong lc ny, ti r?t s?n lng tr? gip!   C?m ?n b?n  Rich

## 2021-06-08 ENCOUNTER — Ambulatory Visit (HOSPITAL_COMMUNITY): Admission: RE | Admit: 2021-06-08 | Payer: Medicare Other | Source: Ambulatory Visit

## 2021-08-16 ENCOUNTER — Ambulatory Visit: Payer: Medicare Other | Admitting: Emergency Medicine

## 2021-10-25 ENCOUNTER — Ambulatory Visit: Payer: Medicare Other | Admitting: Emergency Medicine

## 2022-01-03 ENCOUNTER — Encounter: Payer: Self-pay | Admitting: Emergency Medicine

## 2022-01-03 ENCOUNTER — Ambulatory Visit (INDEPENDENT_AMBULATORY_CARE_PROVIDER_SITE_OTHER): Payer: Medicare Other | Admitting: Emergency Medicine

## 2022-01-03 VITALS — BP 128/74 | HR 64 | Temp 98.2°F | Ht 62.0 in | Wt 100.0 lb

## 2022-01-03 DIAGNOSIS — I152 Hypertension secondary to endocrine disorders: Secondary | ICD-10-CM

## 2022-01-03 DIAGNOSIS — E782 Mixed hyperlipidemia: Secondary | ICD-10-CM

## 2022-01-03 DIAGNOSIS — E119 Type 2 diabetes mellitus without complications: Secondary | ICD-10-CM

## 2022-01-03 DIAGNOSIS — Z7689 Persons encountering health services in other specified circumstances: Secondary | ICD-10-CM

## 2022-01-03 DIAGNOSIS — E1165 Type 2 diabetes mellitus with hyperglycemia: Secondary | ICD-10-CM | POA: Insufficient documentation

## 2022-01-03 DIAGNOSIS — E1169 Type 2 diabetes mellitus with other specified complication: Secondary | ICD-10-CM | POA: Diagnosis not present

## 2022-01-03 DIAGNOSIS — E1159 Type 2 diabetes mellitus with other circulatory complications: Secondary | ICD-10-CM | POA: Diagnosis not present

## 2022-01-03 DIAGNOSIS — I10 Essential (primary) hypertension: Secondary | ICD-10-CM

## 2022-01-03 DIAGNOSIS — E785 Hyperlipidemia, unspecified: Secondary | ICD-10-CM | POA: Diagnosis not present

## 2022-01-03 HISTORY — DX: Type 2 diabetes mellitus with other circulatory complications: E11.59

## 2022-01-03 LAB — COMPREHENSIVE METABOLIC PANEL WITH GFR
ALT: 11 U/L (ref 0–35)
AST: 14 U/L (ref 0–37)
Albumin: 4.7 g/dL (ref 3.5–5.2)
Alkaline Phosphatase: 101 U/L (ref 39–117)
BUN: 24 mg/dL — ABNORMAL HIGH (ref 6–23)
CO2: 25 meq/L (ref 19–32)
Calcium: 10 mg/dL (ref 8.4–10.5)
Chloride: 96 meq/L (ref 96–112)
Creatinine, Ser: 1.19 mg/dL (ref 0.40–1.20)
GFR: 47.14 mL/min — ABNORMAL LOW (ref >60.00)
Glucose, Bld: 377 mg/dL — ABNORMAL HIGH (ref 70–99)
Potassium: 4.7 meq/L (ref 3.5–5.1)
Sodium: 130 meq/L — ABNORMAL LOW (ref 135–145)
Total Bilirubin: 0.5 mg/dL (ref 0.2–1.2)
Total Protein: 8.7 g/dL — ABNORMAL HIGH (ref 6.0–8.3)

## 2022-01-03 LAB — LIPID PANEL
Cholesterol: 173 mg/dL (ref 0–200)
HDL: 44.9 mg/dL (ref >39.00)
NonHDL: 128.12
Total CHOL/HDL Ratio: 4
Triglycerides: 239 mg/dL — ABNORMAL HIGH (ref 0.0–149.0)
VLDL: 47.8 mg/dL — ABNORMAL HIGH (ref 0.0–40.0)

## 2022-01-03 LAB — POCT GLYCOSYLATED HEMOGLOBIN (HGB A1C): Hemoglobin A1C: 13.4 % — AB (ref 4.0–5.6)

## 2022-01-03 LAB — LDL CHOLESTEROL, DIRECT: Direct LDL: 91 mg/dL

## 2022-01-03 MED ORDER — SEMAGLUTIDE (2 MG/DOSE) 8 MG/3ML ~~LOC~~ SOPN: 2.0000 mg | PEN_INJECTOR | SUBCUTANEOUS | 3 refills | Status: DC

## 2022-01-03 MED ORDER — LISINOPRIL 20 MG PO TABS: 20.0000 mg | ORAL_TABLET | Freq: Every day | ORAL | 3 refills | Status: DC

## 2022-01-03 MED ORDER — DAPAGLIFLOZIN PROPANEDIOL 10 MG PO TABS: 10.0000 mg | ORAL_TABLET | Freq: Every day | ORAL | 3 refills | Status: DC

## 2022-01-03 MED ORDER — ATORVASTATIN CALCIUM 40 MG PO TABS: 40.0000 mg | ORAL_TABLET | Freq: Every day | ORAL | 3 refills | Status: DC

## 2022-01-03 NOTE — Assessment & Plan Note (Signed)
Stable.  Diet and nutrition discussed. Continue atorvastatin 40 mg daily. The 10-year ASCVD risk score (Arnett DK, et al., 2019) is: 18.9%   Values used to calculate the score:     Age: 66 years     Sex: Female     Is Non-Hispanic African American: No     Diabetic: Yes     Tobacco smoker: No     Systolic Blood Pressure: 128 mmHg     Is BP treated: Yes     HDL Cholesterol: 44.9 mg/dL     Total Cholesterol: 173 mg/dL

## 2022-01-03 NOTE — Patient Instructions (Signed)

## 2022-01-03 NOTE — Progress Notes (Signed)
Cathy Robbins 66 y.o.   Chief Complaint  Patient presents with   New Patient (Initial Visit)    Concerns about blood pressure     HISTORY OF PRESENT ILLNESS: This is a 66 y.o. female first visit to this office, here to establish care with me. Used to see Janeece Agee, NP. Here with family members and interpreter.  Speaks [place] History of diabetes, hypertension, and dyslipidemia, off medications for 6 months. Morning glucose average 300s. Blood pressure readings have been elevated at home. No other complaints or medical concerns today.  HPI   Prior to Admission medications   Medication Sig Start Date End Date Taking? Authorizing Provider  atorvastatin (LIPITOR) 40 MG tablet Take 1 tablet (40 mg total) by mouth daily at 6 PM. 05/29/21  Yes Janeece Agee, NP  Blood Glucose Calibration (CONTOUR NEXT CONTROL) Normal SOLN 1 drop by In Vitro route as needed. 12/19/17  Yes McVey, Madelaine Bhat, PA-C  gabapentin (NEURONTIN) 100 MG capsule Take 100 mg by mouth 3 (three) times daily.   Yes [provider]  gemfibrozil (LOPID) 600 MG tablet TAKE 1 TABLET(600 MG) BY MOUTH TWICE DAILY BEFORE A MEAL. 05/02/21  Yes Janeece Agee, NP  lisinopril (ZESTRIL) 20 MG tablet Take 1 tablet (20 mg total) by mouth daily. 05/29/21  Yes Janeece Agee, NP  cyclobenzaprine (FLEXERIL) 5 MG tablet TAKE 1 TABLET(5 MG) BY MOUTH TWICE DAILY AS NEEDED FOR MUSCLE SPASMS Patient not taking: Reported on 01/03/2022 05/29/21   Janeece Agee, NP  loratadine (CLARITIN) 10 MG tablet Take 1 tablet (10 mg total) by mouth daily. Patient not taking: Reported on 01/03/2022 12/11/20   Zola Button, Grayling Congress, DO  pregabalin (LYRICA) 75 MG capsule Take 1 capsule (75 mg total) by mouth 2 (two) times daily. Patient not taking: Reported on 01/03/2022 05/29/21   Janeece Agee, NP  Semaglutide, 2 MG/DOSE, 8 MG/3ML SOPN Inject 2 mg into the skin once a week. Patient not taking: Reported on 01/03/2022 05/29/21   Janeece Agee, NP  traMADol-acetaminophen (ULTRACET) 37.5-325 MG tablet Take 1 tablet by mouth every 6 (six) hours as needed. Patient not taking: Reported on 01/03/2022 02/03/17   McVey, Madelaine Bhat, PA-C    No Known Allergies  Patient Active Problem List   Diagnosis Date Noted   Chronic pain of both hips 07/14/2019   Positive ANA (antinuclear antibody) 07/14/2019   Type 2 diabetes mellitus without complication, without long-term current use of insulin (HCC) 11/20/2015   Hyperlipidemia 11/20/2015    Past Medical History:  Diagnosis Date   Diabetes mellitus without complication (HCC)     History reviewed. No pertinent surgical history.  Social History   Socioeconomic History   Marital status: Married    Spouse name: Not on file   Number of children: 4   Years of education: Not on file   Highest education level: Not on file  Occupational History   Not on file  Tobacco Use   Smoking status: Never   Smokeless tobacco: Never  Vaping Use   Vaping Use: Never used  Substance and Sexual Activity   Alcohol use: No   Drug use: No   Sexual activity: Not Currently  Other Topics Concern   Not on file  Social History Narrative   Not on file   Social Determinants of Health   Financial Resource Strain: Low Risk  (01/12/2019)   Overall Financial Resource Strain (CARDIA)    Difficulty of Paying Living Expenses: Not hard at all  Food  Insecurity: No Food Insecurity (01/12/2019)   Hunger Vital Sign    Worried About Running Out of Food in the Last Year: Never true    Ran Out of Food in the Last Year: Never true  Transportation Needs: No Transportation Needs (01/12/2019)   PRAPARE - Administrator, Civil Service (Medical): No    Lack of Transportation (Non-Medical): No  Physical Activity: Sufficiently Active (01/12/2019)   Exercise Vital Sign    Days of Exercise per Week: 5 days    Minutes of Exercise per Session: 30 min  Stress: No Stress Concern Present (01/12/2019)    [employer] of Occupational Health - Occupational Stress Questionnaire    Feeling of Stress : Not at all  Social Connections: Moderately Isolated (01/12/2019)   Social Connection and Isolation Panel [NHANES]    Frequency of Communication with Friends and Family: Twice a week    Frequency of Social Gatherings with Friends and Family: Once a week    Attends Religious Services: Never    Database administrator or Organizations: No    Attends Banker Meetings: Never    Marital Status: Married  Catering manager Violence: Not At Risk (01/12/2019)   Humiliation, Afraid, Rape, and Kick questionnaire    Fear of Current or Ex-Partner: No    Emotionally Abused: No    Physically Abused: No    Sexually Abused: No    History reviewed. No pertinent family history.   Review of Systems  Constitutional: Negative.  Negative for chills and fever.  HENT: Negative.  Negative for congestion and sore throat.   Respiratory: Negative.  Negative for cough and shortness of breath.   Cardiovascular: Negative.  Negative for chest pain and palpitations.  Gastrointestinal: Negative.  Negative for abdominal pain, diarrhea, nausea and vomiting.  Genitourinary: Negative.   Skin: Negative.  Negative for rash.  Neurological:  Negative for dizziness and headaches.  All other systems reviewed and are negative.   Today's Vitals   01/03/22 1551  BP: 128/74  Pulse: 64  Temp: 98.2 F (36.8 C)  TempSrc: Oral  SpO2: 98%  Weight: 100 lb (45.4 kg)  Height: 5\' 2"  (1.575 m)   Body mass index is 18.29 kg/m. Wt Readings from Last 3 Encounters:  01/03/22 100 lb (45.4 kg)  05/29/21 104 lb 6.4 oz (47.4 kg)  02/27/21 107 lb 3.2 oz (48.6 kg)    Physical Exam Vitals reviewed.  Constitutional:      Appearance: Normal appearance.  HENT:     Head: Normocephalic.     Mouth/Throat:     Mouth: Mucous membranes are moist.     Pharynx: Oropharynx is clear.  Eyes:     Extraocular Movements: Extraocular  movements intact.     Conjunctiva/sclera: Conjunctivae normal.     Pupils: Pupils are equal, round, and reactive to light.  Cardiovascular:     Rate and Rhythm: Normal rate and regular rhythm.     Pulses: Normal pulses.     Heart sounds: Normal heart sounds.  Pulmonary:     Effort: Pulmonary effort is normal.     Breath sounds: Normal breath sounds.  Abdominal:     Palpations: Abdomen is soft.     Tenderness: There is no abdominal tenderness.  Musculoskeletal:     Cervical back: No tenderness.     Right lower leg: No edema.     Left lower leg: No edema.  Lymphadenopathy:     Cervical: No cervical adenopathy.  Skin:  General: Skin is warm and dry.     Capillary Refill: Capillary refill takes less than 2 seconds.  Neurological:     General: No focal deficit present.     Mental Status: She is alert and oriented to person, place, and time.  Psychiatric:        Mood and Affect: Mood normal.        Behavior: Behavior normal.     Results for orders placed or performed in visit on 01/03/22 (from the past 24 hour(s))  POCT HgB A1C     Status: Abnormal   Collection Time: 01/03/22  4:26 PM  Result Value Ref Range   Hemoglobin A1C 13.4 (A) 4.0 - 5.6 %   HbA1c POC (<> result, manual entry)     HbA1c, POC (prediabetic range)     HbA1c, POC (controlled diabetic range)      ASSESSMENT & PLAN: A total of 50 minutes was spent with the patient and counseling/coordination of care regarding preparing for this visit, review of most recent office visit notes, review of most recent blood work results including today's hemoglobin A1c, review of all medications and changes made, cardiovascular risks associated with uncontrolled diabetes and hypertension, education on nutrition, prognosis, documentation, and need for follow-up.  Problem List Items Addressed This Visit       Cardiovascular and Mediastinum   Hypertension associated with diabetes (HCC)    Well-controlled hypertension. Continue  lisinopril 20 mg daily. BP Readings from Last 3 Encounters:  01/03/22 128/74  05/29/21 122/74  02/27/21 130/70  Uncontrolled diabetes.  See below. Cardiovascular risk associated with uncontrolled diabetes and hypertension discussed. Diet and nutrition discussed. Follow-up in 4 weeks.       Relevant Medications   atorvastatin (LIPITOR) 40 MG tablet   lisinopril (ZESTRIL) 20 MG tablet   Semaglutide, 2 MG/DOSE, 8 MG/3ML SOPN   dapagliflozin propanediol (FARXIGA) 10 MG TABS tablet   Other Relevant Orders   Urine Microalbumin w/creat. ratio   Comprehensive metabolic panel (Completed)   Lipid panel (Completed)     Endocrine   Dyslipidemia associated with type 2 diabetes mellitus (HCC)    Stable.  Diet and nutrition discussed. Continue atorvastatin 40 mg daily. The 10-year ASCVD risk score (Arnett DK, et al., 2019) is: 18.9%   Values used to calculate the score:     Age: [age] years     Sex: Female     Is Non-Hispanic African American: No     Diabetic: Yes     Tobacco smoker: No     Systolic Blood Pressure: 128 mmHg     Is BP treated: Yes     HDL Cholesterol: 44.9 mg/dL     Total Cholesterol: 173 mg/dL       Relevant Medications   atorvastatin (LIPITOR) 40 MG tablet   lisinopril (ZESTRIL) 20 MG tablet   Semaglutide, 2 MG/DOSE, 8 MG/3ML SOPN   dapagliflozin propanediol (FARXIGA) 10 MG TABS tablet   Uncontrolled type 2 diabetes mellitus with hyperglycemia (HCC) - Primary    Hemoglobin A1c of 13.4.  Patient off medications for 6 months. We will restart previous medications of semaglutide 2 mg weekly and Farxiga 10 mg daily. Diet and nutrition discussed. Follow-up in 4 weeks.      Relevant Medications   atorvastatin (LIPITOR) 40 MG tablet   lisinopril (ZESTRIL) 20 MG tablet   Semaglutide, 2 MG/DOSE, 8 MG/3ML SOPN   dapagliflozin propanediol (FARXIGA) 10 MG TABS tablet   Other Visit Diagnoses  Encounter to establish care       Elevated triglycerides with high  cholesterol       Relevant Medications   atorvastatin (LIPITOR) 40 MG tablet   lisinopril (ZESTRIL) 20 MG tablet   Essential hypertension       Relevant Medications   atorvastatin (LIPITOR) 40 MG tablet   lisinopril (ZESTRIL) 20 MG tablet      Patient Instructions  Diabetes Mellitus and Nutrition, Adult When you have diabetes, or diabetes mellitus, it is very important to have healthy eating habits because your blood sugar (glucose) levels are greatly affected by what you eat and drink. Eating healthy foods in the right amounts, at about the same times every day, can help you: Manage your blood glucose. Lower your risk of heart disease. Improve your blood pressure. Reach or maintain a healthy weight. What can affect my meal plan? Every person with diabetes is different, and each person has different needs for a meal plan. Your health care provider may recommend that you work with a dietitian to make a meal plan that is best for you. Your meal plan may vary depending on factors such as: The calories you need. The medicines you take. Your weight. Your blood glucose, blood pressure, and cholesterol levels. Your activity level. Other health conditions you have, such as heart or kidney disease. How do carbohydrates affect me? Carbohydrates, also called carbs, affect your blood glucose level more than any other type of food. Eating carbs raises the amount of glucose in your blood. It is important to know how many carbs you can safely have in each meal. This is different for every person. Your dietitian can help you calculate how many carbs you should have at each meal and for each snack. How does alcohol affect me? Alcohol can cause a decrease in blood glucose (hypoglycemia), especially if you use insulin or take certain diabetes medicines by mouth. Hypoglycemia can be a life-threatening condition. Symptoms of hypoglycemia, such as sleepiness, dizziness, and confusion, are similar to symptoms  of having too much alcohol. Do not drink alcohol if: Your health care provider tells you not to drink. You are pregnant, may be pregnant, or are planning to become pregnant. If you drink alcohol: Limit how much you have to: 0-1 drink a day for women. 0-2 drinks a day for men. Know how much alcohol is in your drink. In the [place] one drink equals one 12 oz bottle of beer (355 mL), one 5 oz glass of wine (148 mL), or one 1 oz glass of hard liquor (44 mL). Keep yourself hydrated with water, diet soda, or unsweetened iced tea. Keep in mind that regular soda, juice, and other mixers may contain a lot of sugar and must be counted as carbs. What are tips for following this plan?  Reading food labels Start by checking the serving size on the Nutrition Facts label of packaged foods and drinks. The number of calories and the amount of carbs, fats, and other nutrients listed on the label are based on one serving of the item. Many items contain more than one serving per package. Check the total grams (g) of carbs in one serving. Check the number of grams of saturated fats and trans fats in one serving. Choose foods that have a low amount or none of these fats. Check the number of milligrams (mg) of salt (sodium) in one serving. Most people should limit total sodium intake to less than 2,300 mg per day. Always check the  nutrition information of foods labeled as "low-fat" or "nonfat." These foods may be higher in added sugar or refined carbs and should be avoided. Talk to your dietitian to identify your daily goals for nutrients listed on the label. Shopping Avoid buying canned, pre-made, or processed foods. These foods tend to be high in fat, sodium, and added sugar. Shop around the outside edge of the grocery store. This is where you will most often find fresh fruits and vegetables, bulk grains, fresh meats, and fresh dairy products. Cooking Use low-heat cooking methods, such as baking, instead of  high-heat cooking methods, such as deep frying. Cook using healthy oils, such as olive, canola, or sunflower oil. Avoid cooking with butter, cream, or high-fat meats. Meal planning Eat meals and snacks regularly, preferably at the same times every day. Avoid going long periods of time without eating. Eat foods that are high in fiber, such as fresh fruits, vegetables, beans, and whole grains. Eat 4-6 oz (112-168 g) of lean protein each day, such as lean meat, chicken, fish, eggs, or tofu. One ounce (oz) (28 g) of lean protein is equal to: 1 oz (28 g) of meat, chicken, or fish. 1 egg.  cup (62 g) of tofu. Eat some foods each day that contain healthy fats, such as avocado, nuts, seeds, and fish. What foods should I eat? Fruits Berries. Apples. Oranges. Peaches. Apricots. Plums. Grapes. Mangoes. Papayas. Pomegranates. Kiwi. Cherries. Vegetables Leafy greens, including lettuce, spinach, kale, chard, collard greens, mustard greens, and cabbage. Beets. Cauliflower. Broccoli. Carrots. Green beans. Tomatoes. Peppers. Onions. Cucumbers. Brussels sprouts. Grains Whole grains, such as whole-wheat or whole-grain bread, crackers, tortillas, cereal, and pasta. Unsweetened oatmeal. Quinoa. Brown or wild rice. Meats and other proteins Seafood. Poultry without skin. Lean cuts of poultry and beef. Tofu. Nuts. Seeds. Dairy Low-fat or fat-free dairy products such as milk, yogurt, and cheese. The items listed above may not be a complete list of foods and beverages you can eat and drink. Contact a dietitian for more information. What foods should I avoid? Fruits Fruits canned with syrup. Vegetables Canned vegetables. Frozen vegetables with butter or cream sauce. Grains Refined white flour and flour products such as bread, pasta, snack foods, and cereals. Avoid all processed foods. Meats and other proteins Fatty cuts of meat. Poultry with skin. Breaded or fried meats. Processed meat. Avoid saturated  fats. Dairy Full-fat yogurt, cheese, or milk. Beverages Sweetened drinks, such as soda or iced tea. The items listed above may not be a complete list of foods and beverages you should avoid. Contact a dietitian for more information. Questions to ask a health care provider Do I need to meet with a certified diabetes care and education specialist? Do I need to meet with a dietitian? What number can I call if I have questions? When are the best times to check my blood glucose? Where to find more information: American Diabetes Association: diabetes.org Academy of Nutrition and Dietetics: eatright.[employer] of Diabetes and Digestive and Kidney Diseases: StageSync.si Association of Diabetes Care & Education Specialists: diabeteseducator.org Summary It is important to have healthy eating habits because your blood sugar (glucose) levels are greatly affected by what you eat and drink. It is important to use alcohol carefully. A healthy meal plan will help you manage your blood glucose and lower your risk of heart disease. Your health care provider may recommend that you work with a dietitian to make a meal plan that is best for you. This information is not intended to  replace advice given to you by your health care provider. Make sure you discuss any questions you have with your health care provider. Document Revised: 01/19/2020 Document Reviewed: 01/19/2020 Elsevier Patient Education  2023 Elsevier Inc.    Edwina Barth, MD [facility]

## 2022-01-03 NOTE — Assessment & Plan Note (Signed)
Hemoglobin A1c of 13.4.  Patient off medications for 6 months. We will restart previous medications of semaglutide 2 mg weekly and Farxiga 10 mg daily. Diet and nutrition discussed. Follow-up in 4 weeks.

## 2022-01-03 NOTE — Assessment & Plan Note (Signed)
Well-controlled hypertension. Continue lisinopril 20 mg daily. BP Readings from Last 3 Encounters:  01/03/22 128/74  05/29/21 122/74  02/27/21 130/70  Uncontrolled diabetes.  See below. Cardiovascular risk associated with uncontrolled diabetes and hypertension discussed. Diet and nutrition discussed. Follow-up in 4 weeks.

## 2022-01-04 ENCOUNTER — Telehealth: Payer: Self-pay | Admitting: *Deleted

## 2022-01-04 ENCOUNTER — Other Ambulatory Visit: Payer: Self-pay | Admitting: Emergency Medicine

## 2022-01-04 DIAGNOSIS — E1121 Type 2 diabetes mellitus with diabetic nephropathy: Secondary | ICD-10-CM

## 2022-01-04 LAB — MICROALBUMIN / CREATININE URINE RATIO
Creatinine,U: 55.7 mg/dL
Microalb Creat Ratio: 6.4 mg/g (ref 0.0–30.0)
Microalb, Ur: 3.5 mg/dL — ABNORMAL HIGH (ref 0.0–1.9)

## 2022-01-04 MED ORDER — KERENDIA 10 MG PO TABS: 10.0000 mg | ORAL_TABLET | Freq: Every day | ORAL | 1 refills | Status: AC

## 2022-01-04 NOTE — Telephone Encounter (Signed)
Pt was on cover-my-meds need PA on Kerendia 10 mg. Submitted PA w/  (Key: [id number]). Rec'd msg Your information has been sent to [facility]...Raechel Chute

## 2022-01-04 NOTE — Telephone Encounter (Signed)
Rec'd determination back med was APPROVED. It states This request has received a Favorable outcome. Effective 01/04/22 through 06/30/2022. Faxing approval to [email or web address]

## 2022-01-04 NOTE — Telephone Encounter (Signed)
Thanks

## 2022-01-29 ENCOUNTER — Ambulatory Visit (INDEPENDENT_AMBULATORY_CARE_PROVIDER_SITE_OTHER): Payer: Medicare Other | Admitting: Emergency Medicine

## 2022-01-29 ENCOUNTER — Encounter: Payer: Self-pay | Admitting: Emergency Medicine

## 2022-01-29 VITALS — BP 128/80 | HR 61 | Temp 97.7°F | Ht 62.0 in | Wt 100.1 lb

## 2022-01-29 DIAGNOSIS — Z23 Encounter for immunization: Secondary | ICD-10-CM | POA: Diagnosis not present

## 2022-01-29 DIAGNOSIS — E782 Mixed hyperlipidemia: Secondary | ICD-10-CM

## 2022-01-29 DIAGNOSIS — I10 Essential (primary) hypertension: Secondary | ICD-10-CM

## 2022-01-29 DIAGNOSIS — E1169 Type 2 diabetes mellitus with other specified complication: Secondary | ICD-10-CM | POA: Diagnosis not present

## 2022-01-29 DIAGNOSIS — Z1211 Encounter for screening for malignant neoplasm of colon: Secondary | ICD-10-CM

## 2022-01-29 DIAGNOSIS — E1159 Type 2 diabetes mellitus with other circulatory complications: Secondary | ICD-10-CM

## 2022-01-29 DIAGNOSIS — E785 Hyperlipidemia, unspecified: Secondary | ICD-10-CM | POA: Diagnosis not present

## 2022-01-29 DIAGNOSIS — I152 Hypertension secondary to endocrine disorders: Secondary | ICD-10-CM

## 2022-01-29 MED ORDER — ATORVASTATIN CALCIUM 40 MG PO TABS: 40.0000 mg | ORAL_TABLET | Freq: Every day | ORAL | 3 refills | Status: DC

## 2022-01-29 MED ORDER — RYBELSUS 7 MG PO TABS: 7.0000 mg | ORAL_TABLET | Freq: Every day | ORAL | 5 refills | Status: DC

## 2022-01-29 MED ORDER — LISINOPRIL 20 MG PO TABS: 20.0000 mg | ORAL_TABLET | Freq: Every day | ORAL | 3 refills | Status: DC

## 2022-01-29 MED ORDER — DAPAGLIFLOZIN PROPANEDIOL 10 MG PO TABS: 10.0000 mg | ORAL_TABLET | Freq: Every day | ORAL | 3 refills | Status: DC

## 2022-01-29 NOTE — Assessment & Plan Note (Addendum)
Well-controlled hypertension.  Continue lisinopril 20 mg daily. BP Readings from Last 3 Encounters:  01/29/22 128/80  01/03/22 128/74  05/29/21 122/74  Uncontrolled diabetes but better than before. Injectable semaglutide too expensive. Continue Farxiga 10 mg daily. Start Rybelsus 7 mg daily. Cardiovascular risks associated with diabetes and hypertension discussed. Diet and nutrition discussed. Follow-up in 4 weeks.

## 2022-01-29 NOTE — Assessment & Plan Note (Signed)
Stable.  Continue atorvastatin 40 mg daily. Diet and nutrition discussed. Cardiovascular risk associated with dyslipidemia discussed. The 10-year ASCVD risk score (Arnett DK, et al., 2019) is: 18.9%   Values used to calculate the score:     Age: 66 years     Sex: Female     Is Non-Hispanic African American: No     Diabetic: Yes     Tobacco smoker: No     Systolic Blood Pressure: 128 mmHg     Is BP treated: Yes     HDL Cholesterol: 44.9 mg/dL     Total Cholesterol: 173 mg/dL

## 2022-01-29 NOTE — Progress Notes (Signed)
Briza Boyett 66 y.o.   Chief Complaint  Patient presents with   Follow-up    4 week f/u appt     HISTORY OF PRESENT ILLNESS: This is a 66 y.o. female here for 4-week follow-up of uncontrolled diabetes. Only taking Farxiga 10 mg daily.  Semaglutide injections too expensive. Blood sugar this morning at home 201. Taking atorvastatin and lisinopril. No other complaints or medical concerns today.  HPI   Prior to Admission medications   Medication Sig Start Date End Date Taking? Authorizing Provider  atorvastatin (LIPITOR) 40 MG tablet Take 1 tablet (40 mg total) by mouth daily at 6 PM. 01/03/22  Yes Staley Budzinski, Ines Bloomer, MD  Blood Glucose Calibration (CONTOUR NEXT CONTROL) Normal SOLN 1 drop by In Vitro route as needed. 12/19/17  Yes McVey, Gelene Mink, PA-C  dapagliflozin propanediol (FARXIGA) 10 MG TABS tablet Take 1 tablet (10 mg total) by mouth daily before breakfast. 01/03/22 12/29/22 Yes Taiyana Kissler, Ines Bloomer, MD  gemfibrozil (LOPID) 600 MG tablet Take 600 mg by mouth 2 (two) times daily before a meal.   Yes [provider]  lisinopril (ZESTRIL) 20 MG tablet Take 1 tablet (20 mg total) by mouth daily. 01/03/22  Yes Shreeya Recendiz, Ines Bloomer, MD  Finerenone (KERENDIA) 10 MG TABS Take 10 mg by mouth daily. Patient not taking: Reported on 01/29/2022 01/04/22 04/04/22  Horald Pollen, MD  gabapentin (NEURONTIN) 100 MG capsule Take 100 mg by mouth 3 (three) times daily. Patient not taking: Reported on 01/29/2022    [provider]  Semaglutide, 2 MG/DOSE, 8 MG/3ML SOPN Inject 2 mg into the skin once a week. Patient not taking: Reported on 01/29/2022 01/03/22   Horald Pollen, MD    No Known Allergies  Patient Active Problem List   Diagnosis Date Noted   Uncontrolled type 2 diabetes mellitus with hyperglycemia ([place]) 01/03/2022   Hypertension associated with diabetes ([place]) 01/03/2022   Chronic pain of both hips 07/14/2019   Positive ANA (antinuclear antibody) 07/14/2019    Dyslipidemia associated with type 2 diabetes mellitus ([place]) 11/20/2015   Hyperlipidemia 11/20/2015    Past Medical History:  Diagnosis Date   Diabetes mellitus without complication ([place])     No past surgical history on file.  Social History   Socioeconomic History   Marital status: Married    Spouse name: Not on file   Number of children: 4   Years of education: Not on file   Highest education level: Not on file  Occupational History   Not on file  Tobacco Use   Smoking status: Never   Smokeless tobacco: Never  Vaping Use   Vaping Use: Never used  Substance and Sexual Activity   Alcohol use: No   Drug use: No   Sexual activity: Not Currently  Other Topics Concern   Not on file  Social History Narrative   Not on file   Social Determinants of Health   Financial Resource Strain: Low Risk  (01/12/2019)   Overall Financial Resource Strain (CARDIA)    Difficulty of Paying Living Expenses: Not hard at all  Food Insecurity: No Food Insecurity (01/12/2019)   Hunger Vital Sign    Worried About Running Out of Food in the Last Year: Never true    [place] in the Last Year: Never true  Transportation Needs: No Transportation Needs (01/12/2019)   PRAPARE - Hydrologist (Medical): No    Lack of Transportation (Non-Medical): No  Physical  Activity: Sufficiently Active (01/12/2019)   Exercise Vital Sign    Days of Exercise per Week: 5 days    Minutes of Exercise per Session: 30 min  Stress: No Stress Concern Present (01/12/2019)   [employer] of Occupational Health - Occupational Stress Questionnaire    Feeling of Stress : Not at all  Social Connections: Moderately Isolated (01/12/2019)   Social Connection and Isolation Panel [NHANES]    Frequency of Communication with Friends and Family: Twice a week    Frequency of Social Gatherings with Friends and Family: Once a week    Attends Religious Services: Never    Database administrator or  Organizations: No    Attends Banker Meetings: Never    Marital Status: Married  Catering manager Violence: Not At Risk (01/12/2019)   Humiliation, Afraid, Rape, and Kick questionnaire    Fear of Current or Ex-Partner: No    Emotionally Abused: No    Physically Abused: No    Sexually Abused: No    No family history on file.   Review of Systems  Constitutional: Negative.  Negative for chills and fever.  HENT: Negative.    Respiratory: Negative.  Negative for cough and shortness of breath.   Cardiovascular: Negative.  Negative for chest pain and palpitations.  Gastrointestinal:  Negative for abdominal pain, nausea and vomiting.  Genitourinary: Negative.  Negative for dysuria and hematuria.  Musculoskeletal: Negative.   Skin: Negative.  Negative for rash.  Neurological:  Negative for dizziness and headaches.  All other systems reviewed and are negative.  Today's Vitals   01/29/22 0934  BP: 128/80  Pulse: 61  Temp: 97.7 F (36.5 C)  TempSrc: Oral  SpO2: 97%  Weight: 100 lb 2 oz (45.4 kg)  Height: 5\' 2"  (1.575 m)   Body mass index is 18.31 kg/m. Wt Readings from Last 3 Encounters:  01/29/22 100 lb 2 oz (45.4 kg)  01/03/22 100 lb (45.4 kg)  05/29/21 104 lb 6.4 oz (47.4 kg)     Physical Exam Vitals reviewed.  Constitutional:      Appearance: Normal appearance.  HENT:     Head: Normocephalic.  Eyes:     Extraocular Movements: Extraocular movements intact.     Conjunctiva/sclera: Conjunctivae normal.     Pupils: Pupils are equal, round, and reactive to light.  Cardiovascular:     Rate and Rhythm: Normal rate and regular rhythm.     Pulses: Normal pulses.     Heart sounds: Normal heart sounds.  Pulmonary:     Effort: Pulmonary effort is normal.     Breath sounds: Normal breath sounds.  Musculoskeletal:     Cervical back: No tenderness.     Right lower leg: No edema.     Left lower leg: No edema.  Lymphadenopathy:     Cervical: No cervical  adenopathy.  Skin:    General: Skin is warm.     Capillary Refill: Capillary refill takes less than 2 seconds.  Neurological:     General: No focal deficit present.     Mental Status: She is alert and oriented to person, place, and time.  Psychiatric:        Mood and Affect: Mood normal.        Behavior: Behavior normal.      ASSESSMENT & PLAN: A total of 50 minutes was spent with the patient and counseling/coordination of care regarding preparing for this visit, review of most recent office visit notes, review of most  recent blood work results, review of all medications and changes made, cardiovascular risk associated with diabetes, education on nutrition, prognosis, documentation and need for follow-up in 4 weeks.  Problem List Items Addressed This Visit       Cardiovascular and Mediastinum   Hypertension associated with diabetes ([place]) - Primary    Well-controlled hypertension.  Continue lisinopril 20 mg daily. BP Readings from Last 3 Encounters:  01/29/22 128/80  01/03/22 128/74  05/29/21 122/74  Uncontrolled diabetes but better than before. Injectable semaglutide too expensive. Continue Farxiga 10 mg daily. Start Rybelsus 7 mg daily. Cardiovascular risks associated with diabetes and hypertension discussed. Diet and nutrition discussed. Follow-up in 4 weeks.       Relevant Medications   atorvastatin (LIPITOR) 40 MG tablet   dapagliflozin propanediol (FARXIGA) 10 MG TABS tablet   lisinopril (ZESTRIL) 20 MG tablet   Semaglutide (RYBELSUS) 7 MG TABS     Endocrine   Dyslipidemia associated with type 2 diabetes mellitus ([facility])    Stable.  Continue atorvastatin 40 mg daily. Diet and nutrition discussed. Cardiovascular risk associated with dyslipidemia discussed. The 10-year ASCVD risk score (Arnett DK, et al., 2019) is: 18.9%   Values used to calculate the score:     Age: 21 years     Sex: Female     Is Non-Hispanic African American: No     Diabetic: Yes     Tobacco  smoker: No     Systolic Blood Pressure: 0000000 mmHg     Is BP treated: Yes     HDL Cholesterol: 44.9 mg/dL     Total Cholesterol: 173 mg/dL       Relevant Medications   atorvastatin (LIPITOR) 40 MG tablet   dapagliflozin propanediol (FARXIGA) 10 MG TABS tablet   lisinopril (ZESTRIL) 20 MG tablet   Semaglutide (RYBELSUS) 7 MG TABS   Other Visit Diagnoses     Need for vaccination       Relevant Orders   Pneumococcal conjugate vaccine 20-valent (Prevnar 20) (Completed)   Elevated triglycerides with high cholesterol       Relevant Medications   atorvastatin (LIPITOR) 40 MG tablet   lisinopril (ZESTRIL) 20 MG tablet   Essential hypertension       Relevant Medications   atorvastatin (LIPITOR) 40 MG tablet   lisinopril (ZESTRIL) 20 MG tablet   Colon cancer screening       Relevant Orders   Cologuard        Patient Instructions  B?nh ti?u ???ng v dinh d??ng, ng??i l?n Diabetes Mellitus and Nutrition, Adult Khi qu v? b? ti?u ???ng hay b?nh ti?u ???ng, ?i?u r?t quan tr?ng l ph?i c thi quen ?n u?ng t?t cho s?c kh?e b?i v nh?ng g qu v? ?n v u?ng ?nh h??ng l?n ??n n?ng ?? ???ng huy?t (glucose) c?a qu v?. ?n th?c ph?m t?t cho s?c kh?e v?i s? l??ng v?a ph?i, vo cng cc th?i ?i?m m?i ngy, c th? gip qu v?: Qu?n l ???ng huy?t. Gi?m nguy c? b? b?nh tim. C?i thi?n huy?t p. ??t ???c ho?c duy tr m??c cn n?ng c l?i cho s?c kh?e. ?i?u g c th? ?nh h??ng ??n k? ho?ch ?n u?ng c?a ti? M?i ng??i b? ti?u ???ng khc nhau v m?i ng??i ??u c nhu c?u v? ch??ng trnh ?n u?ng khc nhau. Chuyn gia ch?m [place] s?c kh?e c?a qu v? c th? [place] qu v? trao ??i v?i chuyn gia dinh d??ng ?? xy d?ng ch??ng trnh ?n u?ng t?t nh?t  cho qu v?. Ch??ng trnh ?n u?ng c?a qu v? c th? thay ??i ty thu?c vo cc y?u t? nh?: L??ng calo quy? vi? c?n. Cc lo?i thu?c m quy? vi? du?ng. Cn n??ng cu?a quy? vi?. N?ng ?? ???ng huy?t, huy?t p v cholesterol c?a qu v?. M?c ?? ho?t ??ng c?a qu v?. Cc  tnh tr?ng s?c kh?e khc m qu v? c, ch?ng h?n nh? b?nh tim ho?c b?nh th?n. Carbohydrate ?nh h??ng nh? th? no ??n ti? Carbohydrate, hay cn g?i l carb, ?nh h??ng ??n l??ng ???ng huy?t c?a quy? vi? nhi?u h?n b?t k? lo?i th?c ph?m no khc. ?n carb lm t?ng l??ng glucose trong mu. Bi?t l??ng carb m qu v? c th? ?n m?t cch an ton trong m?i b?a ?n c vai tr quan tr?ng. L??ng carbohydrate ny khc nhau v?i m?i ng??i. Chuyn gia dinh d??ng c?a qu v? c th? gip tnh ton l??ng carb m qu v? c?n ph?i ?n vo m?i b?a ?n chnh v m?i b?a ?n nh?. R??u ?nh h??ng ??n ti nh? th? no? R??u c th? gy gi?m ???ng huy?t (h? ???ng huy?t), ??c bi?t l n?u qu v? s? d?ng insulin ho?c u?ng m?t s? lo?i thu?c nh?t ??nh ?i?u tr? ti?u ???ng. H? ???ng huy?t c th? l m?t tnh tr?ng ?e d?a tnh [place]?ng. Cc tri?u ch?ng c?a h? ???ng huy?t, ch?ng h?n bu?n ng?, chng m?t v l l?n, t??ng t? cc tri?u ch?ng c?a vi?c u?ng qu nhi?u r??u. Khng u?ng r??u n?u: Chuyn gia ch?m [place] s?c kh?e khuyn qu v? khng u?ng r??u. Qu v? c [place], c th? c [place], ho?c c k? ho?ch c [place]. N?u qu v? u?ng r??u: Gi?i h?n l??ng r??u qu v? u?ng ? m?c: 0-1 ly/ngy ??i v?i n? gi?i. 0-2 ly/ngy ??i v?i nam gi?i. Bi?t m?t ly c bao nhiu r??u. ? M?, m?t ly t??ng ???ng v?i m?t chai bia 12 ao-x? (355 mL), m?t ly r??u vang 5 ao-x? (148 mL), ho?c m?t ly r??u m?nh 1 ao-x? (44 mL). Lun b ?? n??c b?ng n??c, soda dnh cho ng??i ?n king, ho?c tr ? khng ???ng. Orlene Plum nh? r?ng soda thng th???ng, n??c tri cy v ca?c ?? u?ng h?n h??p khc c th? ch?a nhi?u ???ng v ph?i ???c tnh l carb. C nh?ng l?i khuyn no ?? tun th? k? ho?ch ny?  ??c nhn th?c ph?m B?t ??u b?ng vi?c ki?m tra kch c? kh?u ph?n trn nhn Thng tin dinh d??ng c?a th?c ph?m v ?? u?ng ?ng gi. S? calo v l??ng carb, ch?t bo v cc ch?t dinh d??ng khc ghi trn nhn d?a trn m?t kh?u ph?n c?a mn ?. Nhi?u mn ch?a nhi?u h?n m?t kh?u ph?n trn m?i bao b. Ki?m tra  t?ng s? gram (g) carb trong m?t kh?u ph?n. Ki?m tra s? gram ch?t bo bo ha v ch?t bo chuy?n ha trong m?t kh?u ph?n. Ch?n th?c ph?m c t ho?c khng c nh?ng ch?t bo ny. Ki?m tra s? miligam (mg) mu?i (natri) trong m?t kh?u ph?n. H?u h?t m?i ng??i ??u nn gi?i h?n t?ng natri tiu thu? ?? m??c d??i 2.300 mg m?i ngy. Lun ki?m tra thng tin dinh d??ng c?a th?c ph?m ghi trn nhn l "t bo" hay "khng bo". Nh?ng th?c ph?m ny c th? c b? sung ???ng ho?c carb tinh luy?n cao h?n v c?n ph?i trnh. Hy trao ??i v?i chuyn gia dinh d??ng c?a qu v? ?? xc ??nh m?c tiu hng ngy c?a qu v? ??i  v?i cc ch?t dinh d??ng li?t k trn nhn. Mua s?m Trnh mua nh?ng th?c ph?m ?ng h?p, lm s?n ho?c ch? bi?n s?n. Nh?ng th?c ph?m ny c xu h??ng c nhi?u ch?t bo, natri, b? sung ???ng. Mua s?m quanh ra ngoi c?a c?a hng t?p ha. ?y l n?i qu v? s? d? tm th?y tri cy v rau c? t??i, nhi?u lo?i h?t, th?t t??i v s?n ph?m s?a t??i. N?u n??ng S? d?ng ca?c ph??ng php n?u ?n nhi?t ?? th?p, ch?ng h?n nh? n??ng, thay v ph??ng php n?u nhi?t ?? cao nh? chin ng?p d?u. N?u ?n b?ng cch s? d?ng cc lo?i d?u t?t cho s?c kh?e, ch?ng h?n nh? d?u  liu, d?u h?t c?i d?u ho?c d?u h??ng d??ng. [place] n?u v?i b?, kem, ho?c th?t nhi?u m?. Ln k? ho?ch cho b?a ?n ?n cc b?a ?n chnh v cc b?a ?n nh? ??u ??n, t?t nh?t l vo cng m?t th?i ?i?m m?i ngy. Trnh nhi?n ?n trong th?i [employer]. ?n th?c ?n giu ch?t x? nh? cc lo?i tri cy t??i, rau c?, ??u v ng? c?c nguyn cm. ?n 4-6 ao-x? (oz) (112-168 g) protein n?c m?i ngy, ch?ng h?n nh? th?t n?c, th?t g, c, tr?ng, ho?c ??u ph?. M?t ao-x? (oz) (28 g) protein n?c b?ng: 1 ao-x? (oz) (28 g) th?t, th?t g ho?c c. 1 qu? tr?ng. 1?4 c?c (62 g) ??u ph?. ?n m?t s? th?c ph?m ch?a cc ch?t bo lnh m?nh m?i ngy, ch?ng h?n nh? qu? b?, qu? h?ch, cc lo?i h?t v c. Ti nn ?n nh?ng th?c ph?m no? Tri cy Qu? m?ng. To. [place]? ?o. Qu? m?. Qu? m?n. Nho. Xoi. Qu? ?u  ??[place]? l?u. Qu? kiwi. Qu? anh ?o. Derrek Gu c? Ferne Coe, bao g?m c?i xo?n, rau bina, kale, c?i c?u v?ng, c?i r? c?i b? xanh v c?i b?p. C? c?i ???ng. Sp l?. Bng c?i xanh. C r?t. ??u xanh. C chua. ?t ng?t. Hnh ty. D?a chu?t. C?i bruxen. Ng? c?c Ng? c?c nguyn ca?m nh? bnh m nguyn ca?m ho??c nguyn h?t, bnh quy gin, bnh m ng, ng? c?c v m ?ng. B?t y?n m?ch khng ???ng. H?t dim m?ch (quinoa). G?o l?c ho?c g?o khng xt. Th?t v cc protein khc H?i s?n. Th?t gia c?m khng da. Mi?ng n?c th?t gia c?m v th?t b. ??u ph?. Qu? h?ch. Cc lo?i h?t. S?a Cc s?n ph?m s??a khng co? ho??c co? i?t ch?t be?o, ch?ng h?n nh? s?a, s??a chua va? pho mt. Nh?ng th?c ph?m li?t k ? trn c th? khng ph?i l m?t danh m?c ??y ?? cc th?c ph?m v ?? u?ng m qu v? c th? ?n v u?ng. Lin h? v?i chuyn gia dinh d??ng ?? c thm thng tin. Ti nn trnh nh?ng th?c ph?m no? Tri cy Tri cy ?ng h?p xi-r. Derrek Gu c? Derrek Gu c? ?ng h?p. Rau ?ng l?nh v?i b? ho?c n??c s?t kem. Ng? c?c Cc s?n ph?m lm t? b?t m tr?ng v b?t m ???c tinh ch? nh? bnh m, m ?ng, ?? ?n nh? v ng? c?c. Trnh t?t c? cc th?c ph?m ch? bi?n s?n. Th?t v cc protein khc Cc la?t thi?t m??. Th?t gia c?m c da. Th?t chin/rn ho?c t?m b?t rn. Th?t ch? bi?n s?n. Trnh cc ch?t bo bo ha. S?a S?a chua nguyn ch?t bo, pho mt ho?c s?a. ?? u?ng ?? u?ng c ???ng, ch?ng h?n nh? soda ho?c tr ?Marland Kitchen Nh?ng th?c ph?m li?t k ? trn c  th? khng ph?i l m?t danh m?c ??y ?? cc lo?i th?c ph?m v ?? u?ng m qu v? nn trnh. Lin h? v?i chuyn gia dinh d??ng ?? c thm thng tin. Ca?c cu h?i c?n ??a ra v?i chuyn gia ch?m [place] s?c kh?e Ti c c?n g?p chuyn gia gio d?c v ch?m [place] b?nh ti?u ???ng c ch?ng nh?n khng? Ti c c?n g?p chuyn gia dinh d??ng khng? Ti co? th? go?i cho s? na?o n?u ti co? th?c m?c? Khi no l th?i ?i?m ki?m tra ???ng huy?t t?t nh?t? N?i ?? tm thm thng tin: American Diabetes Association (Hi?p h?i Ti?u  ????ng [place] K?): diabetes.org Academy of Nutrition and Dietetics ([place] d??ng v ?n u?ng): [employer]right[employer] of Diabetes and Digestive and Kidney Diseases (Vi?n Ti?u ???ng v cc B?nh Tiu ha v B?nh Th?n Qu?c gia): [email or web address]is Association of Diabetes Care & Education Specialists (Hi?p h?i Chuyn gia Gio d?c v Ch?m [place] B?nh ti?u ???ng): [email or web address] Tm t?t ?i?u r?t quan tr?ng l ph?i c thi quen ?n u?ng t?t cho s?c kh?e b?i v nh?ng g qu v? ?n v u?ng ?nh h??ng l?n ??n n?ng ?? ???ng huy?t (glucose) c?a qu v?. ?i?u quan tr?ng l s? d?ng m?t r??u cch th?n tr?ng. M?t k? ho?ch ?n u?ng t?t cho s?c kh?e s? gip qu v? ki?m sot ???ng huy?t v gi?m nguy c? m?c b?nh tim. Chuyn gia ch?m [place] s?c kh?e c?a qu v? c th? [place] qu v? trao ??i v?i chuyn gia dinh d??ng ?? xy d?ng ch??ng trnh ?n u?ng t?t nh?t cho qu v?. Thng tin ny khng nh?m m?c ?ch thay th? cho l?i khuyn m chuyn gia ch?m [place] s?c kh?e ni v?i qu v?. Hy b?o ??m qu v? ph?i th?o lu?n b?t k? v?n ?? g m qu v? c v?i chuyn gia ch?m [place] s?c kh?e c?a qu v?. Document Revised: 11/16/2020 Document Reviewed: 11/16/2020 Elsevier Patient Education  [place], MD Standard [facility]

## 2022-01-29 NOTE — Patient Instructions (Signed)
B?nh ti?u ???ng v dinh d??ng, ng??i l?n Diabetes Mellitus and Nutrition, Adult Khi qu v? b? ti?u ???ng hay b?nh ti?u ???ng, ?i?u r?t quan tr?ng l ph?i c thi quen ?n u?ng t?t cho s?c kh?e b?i v nh?ng g qu v? ?n v u?ng ?nh h??ng l?n ??n n?ng ?? ???ng huy?t (glucose) c?a qu v?. ?n th?c ph?m t?t cho s?c kh?e v?i s? l??ng v?a ph?i, vo cng cc th?i ?i?m m?i ngy, c th? gip qu v?: Qu?n l ???ng huy?t. Gi?m nguy c? b? b?nh tim. C?i thi?n huy?t p. ??t ???c ho?c duy tr m??c cn n?ng c l?i cho s?c kh?e. ?i?u g c th? ?nh h??ng ??n k? ho?ch ?n u?ng c?a ti? M?i ng??i b? ti?u ???ng khc nhau v m?i ng??i ??u c nhu c?u v? ch??ng trnh ?n u?ng khc nhau. Chuyn gia ch?m sc s?c kh?e c?a qu v? c th? khuyn qu v? trao ??i v?i chuyn gia dinh d??ng ?? xy d?ng ch??ng trnh ?n u?ng t?t nh?t cho qu v?. Ch??ng trnh ?n u?ng c?a qu v? c th? thay ??i ty thu?c vo cc y?u t? nh?: L??ng calo quy? vi? c?n. Cc lo?i thu?c m quy? vi? du?ng. Cn n??ng cu?a quy? vi?. N?ng ?? ???ng huy?t, huy?t p v cholesterol c?a qu v?. M?c ?? ho?t ??ng c?a qu v?. Cc tnh tr?ng s?c kh?e khc m qu v? c, ch?ng h?n nh? b?nh tim ho?c b?nh th?n. Carbohydrate ?nh h??ng nh? th? no ??n ti? Carbohydrate, hay cn g?i l carb, ?nh h??ng ??n l??ng ???ng huy?t c?a quy? vi? nhi?u h?n b?t k? lo?i th?c ph?m no khc. ?n carb lm t?ng l??ng glucose trong mu. Bi?t l??ng carb m qu v? c th? ?n m?t cch an ton trong m?i b?a ?n c vai tr quan tr?ng. L??ng carbohydrate ny khc nhau v?i m?i ng??i. Chuyn gia dinh d??ng c?a qu v? c th? gip tnh ton l??ng carb m qu v? c?n ph?i ?n vo m?i b?a ?n chnh v m?i b?a ?n nh?. R??u ?nh h??ng ??n ti nh? th? no? R??u c th? gy gi?m ???ng huy?t (h? ???ng huy?t), ??c bi?t l n?u qu v? s? d?ng insulin ho?c u?ng m?t s? lo?i thu?c nh?t ??nh ?i?u tr? ti?u ???ng. H? ???ng huy?t c th? l m?t tnh tr?ng ?e d?a tnh [place]?ng. Cc tri?u ch?ng c?a h? ???ng huy?t, ch?ng h?n bu?n ng?, chng  m?t v l l?n, t??ng t? cc tri?u ch?ng c?a vi?c u?ng qu nhi?u r??u. Khng u?ng r??u n?u: Chuyn gia ch?m sc s?c kh?e khuyn qu v? khng u?ng r??u. Qu v? c thai, c th? c thai, ho?c c k? ho?ch c thai. N?u qu v? u?ng r??u: Gi?i h?n l??ng r??u qu v? u?ng ? m?c: 0-1 ly/ngy ??i v?i n? gi?i. 0-2 ly/ngy ??i v?i nam gi?i. Bi?t m?t ly c bao nhiu r??u. ? M?, m?t ly t??ng ???ng v?i m?t chai bia 12 ao-x? (355 mL), m?t ly r??u vang 5 ao-x? (148 mL), ho?c m?t ly r??u m?nh 1 ao-x? (44 mL). Lun b ?? n??c b?ng n??c, soda dnh cho ng??i ?n king, ho?c tr ? khng ???ng. Lun nh? r?ng soda thng th???ng, n??c tri cy v ca?c ?? u?ng h?n h??p khc c th? ch?a nhi?u ???ng v ph?i ???c tnh l carb. C nh?ng l?i khuyn no ?? tun th? k? ho?ch ny?  ??c nhn th?c ph?m B?t ??u b?ng vi?c ki?m tra kch c? kh?u ph?n trn nhn Thng tin dinh d??ng c?a th?c   ph?m v ?? u?ng ?ng gi. S? calo v l??ng carb, ch?t bo v cc ch?t dinh d??ng khc ghi trn nhn d?a trn m?t kh?u ph?n c?a mn ?. Nhi?u mn ch?a nhi?u h?n m?t kh?u ph?n trn m?i bao b. Ki?m tra t?ng s? gram (g) carb trong m?t kh?u ph?n. Ki?m tra s? gram ch?t bo bo ha v ch?t bo chuy?n ha trong m?t kh?u ph?n. Ch?n th?c ph?m c t ho?c khng c nh?ng ch?t bo ny. Ki?m tra s? miligam (mg) mu?i (natri) trong m?t kh?u ph?n. H?u h?t m?i ng??i ??u nn gi?i h?n t?ng natri tiu thu? ?? m??c d??i 2.300 mg m?i ngy. Lun ki?m tra thng tin dinh d??ng c?a th?c ph?m ghi trn nhn l "t bo" hay "khng bo". Nh?ng th?c ph?m ny c th? c b? sung ???ng ho?c carb tinh luy?n cao h?n v c?n ph?i trnh. Hy trao ??i v?i chuyn gia dinh d??ng c?a qu v? ?? xc ??nh m?c tiu hng ngy c?a qu v? ??i v?i cc ch?t dinh d??ng li?t k trn nhn. Mua s?m Trnh mua nh?ng th?c ph?m ?ng h?p, lm s?n ho?c ch? bi?n s?n. Nh?ng th?c ph?m ny c xu h??ng c nhi?u ch?t bo, natri, b? sung ???ng. Mua s?m quanh ra ngoi c?a c?a hng t?p ha. ?y l n?i qu v? s? d?  tm th?y tri cy v rau c? t??i, nhi?u lo?i h?t, th?t t??i v s?n ph?m s?a t??i. N?u n??ng S? d?ng ca?c ph??ng php n?u ?n nhi?t ?? th?p, ch?ng h?n nh? n??ng, thay v ph??ng php n?u nhi?t ?? cao nh? chin ng?p d?u. N?u ?n b?ng cch s? d?ng cc lo?i d?u t?t cho s?c kh?e, ch?ng h?n nh? d?u  liu, d?u h?t c?i d?u ho?c d?u h??ng d??ng. Trnh n?u v?i b?, kem, ho?c th?t nhi?u m?. Ln k? ho?ch cho b?a ?n ?n cc b?a ?n chnh v cc b?a ?n nh? ??u ??n, t?t nh?t l vo cng m?t th?i ?i?m m?i ngy. Trnh nhi?n ?n trong th?i gian di. ?n th?c ?n giu ch?t x? nh? cc lo?i tri cy t??i, rau c?, ??u v ng? c?c nguyn cm. ?n 4-6 ao-x? (oz) (112-168 g) protein n?c m?i ngy, ch?ng h?n nh? th?t n?c, th?t g, c, tr?ng, ho?c ??u ph?. M?t ao-x? (oz) (28 g) protein n?c b?ng: 1 ao-x? (oz) (28 g) th?t, th?t g ho?c c. 1 qu? tr?ng. 1?4 c?c (62 g) ??u ph?. ?n m?t s? th?c ph?m ch?a cc ch?t bo lnh m?nh m?i ngy, ch?ng h?n nh? qu? b?, qu? h?ch, cc lo?i h?t v c. Ti nn ?n nh?ng th?c ph?m no? Tri cy Qu? m?ng. To. Cam. Qu? ?o. Qu? m?. Qu? m?n. Nho. Xoi. Qu? ?u ??. Qu? l?u. Qu? kiwi. Qu? anh ?o. Rau c? Rau xanh, bao g?m c?i xo?n, rau bina, kale, c?i c?u v?ng, c?i r? c?i b? xanh v c?i b?p. C? c?i ???ng. Sp l?. Bng c?i xanh. C r?t. ??u xanh. C chua. ?t ng?t. Hnh ty. D?a chu?t. C?i bruxen. Ng? c?c Ng? c?c nguyn ca?m nh? bnh m nguyn ca?m ho??c nguyn h?t, bnh quy gin, bnh m ng, ng? c?c v m ?ng. B?t y?n m?ch khng ???ng. H?t dim m?ch (quinoa). G?o l?c ho?c g?o khng xt. Th?t v cc protein khc H?i s?n. Th?t gia c?m khng da. Mi?ng n?c th?t gia c?m v th?t b. ??u ph?. Qu? h?ch. Cc lo?i h?t. S?a Cc s?n ph?m s??a khng co? ho??c co? i?t ch?t be?o, ch?ng h?n nh? s?a, s??a   chua va? pho mt. Nh?ng th?c ph?m li?t k ? trn c th? khng ph?i l m?t danh m?c ??y ?? cc th?c ph?m v ?? u?ng m qu v? c th? ?n v u?ng. Lin h? v?i chuyn gia dinh d??ng ?? c thm thng tin. Ti nn trnh  nh?ng th?c ph?m no? Tri cy Tri cy ?ng h?p xi-r. Rau c? Rau c? ?ng h?p. Rau ?ng l?nh v?i b? ho?c n??c s?t kem. Ng? c?c Cc s?n ph?m lm t? b?t m tr?ng v b?t m ???c tinh ch? nh? bnh m, m ?ng, ?? ?n nh? v ng? c?c. Trnh t?t c? cc th?c ph?m ch? bi?n s?n. Th?t v cc protein khc Cc la?t thi?t m??. Th?t gia c?m c da. Th?t chin/rn ho?c t?m b?t rn. Th?t ch? bi?n s?n. Trnh cc ch?t bo bo ha. S?a S?a chua nguyn ch?t bo, pho mt ho?c s?a. ?? u?ng ?? u?ng c ???ng, ch?ng h?n nh? soda ho?c tr ?. Nh?ng th?c ph?m li?t k ? trn c th? khng ph?i l m?t danh m?c ??y ?? cc lo?i th?c ph?m v ?? u?ng m qu v? nn trnh. Lin h? v?i chuyn gia dinh d??ng ?? c thm thng tin. Ca?c cu h?i c?n ??a ra v?i chuyn gia ch?m sc s?c kh?e Ti c c?n g?p chuyn gia gio d?c v ch?m sc b?nh ti?u ???ng c ch?ng nh?n khng? Ti c c?n g?p chuyn gia dinh d??ng khng? Ti co? th? go?i cho s? na?o n?u ti co? th?c m?c? Khi no l th?i ?i?m ki?m tra ???ng huy?t t?t nh?t? N?i ?? tm thm thng tin: American Diabetes Association (Hi?p h?i Ti?u ????ng Hoa K?): [email or web address].org Academy of Nutrition and Dietetics (H?c vi?n Dinh d??ng v ?n u?ng): [email or web address] National Institute of Diabetes and Digestive and Kidney Diseases (Vi?n Ti?u ???ng v cc B?nh Tiu ha v B?nh Th?n Qu?c gia): [email or web address].gov Association of [employer] Education [employer] (Hi?p h?i Chuyn gia Gio d?c v Ch?m sc B?nh ti?u ???ng): [email or web address] Tm t?t ?i?u r?t quan tr?ng l ph?i c thi quen ?n u?ng t?t cho s?c kh?e b?i v nh?ng g qu v? ?n v u?ng ?nh h??ng l?n ??n n?ng ?? ???ng huy?t (glucose) c?a qu v?. ?i?u quan tr?ng l s? d?ng m?t r??u cch th?n tr?ng. M?t k? ho?ch ?n u?ng t?t cho s?c kh?e s? gip qu v? ki?m sot ???ng huy?t v gi?m nguy c? m?c b?nh tim. Chuyn gia ch?m sc s?c kh?e c?a qu v? c th? khuyn qu v? trao ??i v?i chuyn gia dinh d??ng ?? xy d?ng ch??ng trnh ?n u?ng t?t nh?t cho qu  v?. Thng tin ny khng nh?m m?c ?ch thay th? cho l?i khuyn m chuyn gia ch?m sc s?c kh?e ni v?i qu v?. Hy b?o ??m qu v? ph?i th?o lu?n b?t k? v?n ?? g m qu v? c v?i chuyn gia ch?m sc s?c kh?e c?a qu v?. Document Revised: 11/16/2020 Document Reviewed: 11/16/2020 [employer] Patient Education  2023 [employer]  

## 2022-02-08 DIAGNOSIS — Z1211 Encounter for screening for malignant neoplasm of colon: Secondary | ICD-10-CM | POA: Diagnosis not present

## 2022-02-21 ENCOUNTER — Encounter (HOSPITAL_COMMUNITY): Payer: Self-pay | Admitting: Emergency Medicine

## 2022-02-21 ENCOUNTER — Emergency Department (HOSPITAL_COMMUNITY)
Admission: EM | Admit: 2022-02-21 | Discharge: 2022-02-22 | Disposition: A | Discharge status: Home / Self Care | Payer: Medicare Other | Attending: Emergency Medicine | Admitting: Emergency Medicine

## 2022-02-21 ENCOUNTER — Other Ambulatory Visit: Payer: Self-pay

## 2022-02-21 DIAGNOSIS — Z23 Encounter for immunization: Secondary | ICD-10-CM | POA: Diagnosis not present

## 2022-02-21 DIAGNOSIS — E119 Type 2 diabetes mellitus without complications: Secondary | ICD-10-CM | POA: Insufficient documentation

## 2022-02-21 DIAGNOSIS — T63441A Toxic effect of venom of bees, accidental (unintentional), initial encounter: Secondary | ICD-10-CM | POA: Diagnosis not present

## 2022-02-21 DIAGNOSIS — Z20822 Contact with and (suspected) exposure to covid-19: Secondary | ICD-10-CM | POA: Insufficient documentation

## 2022-02-21 DIAGNOSIS — Z79899 Other long term (current) drug therapy: Secondary | ICD-10-CM | POA: Insufficient documentation

## 2022-02-21 DIAGNOSIS — I10 Essential (primary) hypertension: Secondary | ICD-10-CM | POA: Diagnosis not present

## 2022-02-21 NOTE — ED Triage Notes (Addendum)
Pt brought to ED for evaluation of possible bee sting to rt 3rd finger at approximately 11 am today. Pt has since felt body aches and chills and headache. Denies any difficulty breathing, shortness of breath or hives.

## 2022-02-22 LAB — SARS CORONAVIRUS 2 BY RT PCR: SARS Coronavirus 2 by RT PCR: NEGATIVE

## 2022-02-22 MED ORDER — TETANUS-DIPHTH-ACELL PERTUSSIS 5-2.5-18.5 LF-MCG/0.5 IM SUSY
0.5000 mL | PREFILLED_SYRINGE | Freq: Once | INTRAMUSCULAR | Status: AC
  Administered 2022-02-22: 0.5 mL via INTRAMUSCULAR
  Filled 2022-02-22: qty 0.5

## 2022-02-22 MED ORDER — IBUPROFEN 400 MG PO TABS: 600.0000 mg | ORAL_TABLET | Freq: Once | ORAL | Status: DC

## 2022-02-22 NOTE — ED Provider Notes (Signed)
[facility] EMERGENCY DEPARTMENT Provider Note  CSN: [id number] Arrival date & time: 02/21/22 2337  Chief Complaint(s) Insect Bite  HPI Cathy Robbins is a 66 y.o. female with a past medical history listed below including hypertension, hyperlipidemia, diabetes who presents to the emergency department for bee sting that occurred around 11 AM (approximately 12 hours prior to arrival).  She noted some local swelling and pain which she has been treating with ice and Tylenol.  The pain got worse prompting a visit tonight.  There is no associated urticaria, shortness of breath, edema/swelling, headache or lightheadedness.  Unsure of tetanus status.  HPI  Past Medical History Past Medical History:  Diagnosis Date   Diabetes mellitus without complication [facility])    Patient Active Problem List   Diagnosis Date Noted   Uncontrolled type 2 diabetes mellitus with hyperglycemia (HCC) 01/03/2022   Hypertension associated with diabetes (HCC) 01/03/2022   Chronic pain of both hips 07/14/2019   Positive ANA (antinuclear antibody) 07/14/2019   Dyslipidemia associated with type 2 diabetes mellitus (HCC) 11/20/2015   Hyperlipidemia 11/20/2015   Home Medication(s) Prior to Admission medications   Medication Sig Start Date End Date Taking? Authorizing Provider  atorvastatin (LIPITOR) 40 MG tablet Take 1 tablet (40 mg total) by mouth daily at 6 PM. 01/29/22   Sagardia, Eilleen Kempf, MD  Blood Glucose Calibration (CONTOUR NEXT CONTROL) Normal SOLN 1 drop by In Vitro route as needed. 12/19/17   McVey, Madelaine Bhat, PA-C  dapagliflozin propanediol (FARXIGA) 10 MG TABS tablet Take 1 tablet (10 mg total) by mouth daily before breakfast. 01/29/22 01/24/23  Sagardia, Eilleen Kempf, MD  Finerenone (KERENDIA) 10 MG TABS Take 10 mg by mouth daily. Patient not taking: Reported on 01/29/2022 01/04/22 04/04/22  Georgina Quint, MD  gabapentin (NEURONTIN) 100 MG capsule Take 100 mg by mouth 3 (three) times  daily. Patient not taking: Reported on 01/29/2022    [provider]  lisinopril (ZESTRIL) 20 MG tablet Take 1 tablet (20 mg total) by mouth daily. 01/29/22   Georgina Quint, MD  Semaglutide (RYBELSUS) 7 MG TABS Take 7 mg by mouth daily. 01/29/22   Georgina Quint, MD                                                                                                                                    Allergies Patient has no known allergies.  Review of Systems Review of Systems As noted in HPI  Physical Exam Vital Signs  I have reviewed the triage vital signs BP (!) 145/70   Pulse 67   Temp 98.1 F (36.7 C) (Oral)   Resp (!) 8   SpO2 99%   Physical Exam Vitals reviewed.  Constitutional:      General: She is not in acute distress.    Appearance: She is well-developed. She is not diaphoretic.  HENT:     Head: Normocephalic and atraumatic.  Right Ear: External ear normal.     Left Ear: External ear normal.     Nose: Nose normal.  Eyes:     General: No scleral icterus.    Conjunctiva/sclera: Conjunctivae normal.  Neck:     Trachea: Phonation normal.  Cardiovascular:     Rate and Rhythm: Normal rate and regular rhythm.  Pulmonary:     Effort: Pulmonary effort is normal. No respiratory distress.     Breath sounds: No stridor.  Abdominal:     General: There is no distension.  Musculoskeletal:        General: Normal range of motion.     Right hand: Swelling and tenderness (mild) present.       Hands:     Cervical back: Normal range of motion.  Skin:    Findings: No rash.  Neurological:     Mental Status: She is alert and oriented to person, place, and time.  Psychiatric:        Behavior: Behavior normal.     ED Results and Treatments Labs (all labs ordered are listed, but only abnormal results are displayed) Labs Reviewed  SARS CORONAVIRUS 2 BY RT PCR                                                                                                                          EKG  EKG Interpretation  Date/Time:    Ventricular Rate:    PR Interval:    QRS Duration:   QT Interval:    QTC Calculation:   R Axis:     Text Interpretation:         Radiology No results found.  Medications Ordered in ED Medications  Tdap (BOOSTRIX) injection 0.5 mL (has no administration in time range)  ibuprofen (ADVIL) tablet 600 mg (has no administration in time range)                                                                                                                                     Procedures Procedures  (including critical care time)  Medical Decision Making / ED Course   Medical Decision Making Amount and/or Complexity of Data Reviewed Independent Historian: guardian  Bee sting to the right hand with local inflammation.  No evidence of superimposed infection.  No systemic reaction. recoommend continue supportive management. Tetanus updated Provided with Motrin  Final Clinical Impression(s) / ED Diagnoses Final diagnoses:  Bee sting, accidental or unintentional, initial encounter   The patient appears reasonably screened and/or stabilized for discharge and I doubt any other medical condition or other [facility] requiring further screening, evaluation, or treatment in the ED at this time. I have discussed the findings, Dx and Tx plan with the patient/family who expressed understanding and agree(s) with the plan. Discharge instructions discussed at length. The patient/family was given strict return precautions who verbalized understanding of the instructions. No further questions at time of discharge.  Disposition: Discharge  Condition: Good  ED Discharge Orders     None         Follow Up: Georgina Quint, MD [address] [place] [address] [phone]  Call  to schedule an appointment for close follow up           This chart was dictated using voice recognition software.   Despite best efforts to proofread,  errors can occur which can change the documentation meaning.    Nira Conn, MD 02/22/22 [phone]

## 2022-02-26 ENCOUNTER — Ambulatory Visit (INDEPENDENT_AMBULATORY_CARE_PROVIDER_SITE_OTHER): Payer: Medicare Other | Admitting: Emergency Medicine

## 2022-02-26 ENCOUNTER — Encounter: Payer: Self-pay | Admitting: Emergency Medicine

## 2022-02-26 VITALS — BP 136/72 | HR 67 | Temp 97.6°F | Ht 62.0 in | Wt 98.5 lb

## 2022-02-26 DIAGNOSIS — E785 Hyperlipidemia, unspecified: Secondary | ICD-10-CM

## 2022-02-26 DIAGNOSIS — I152 Hypertension secondary to endocrine disorders: Secondary | ICD-10-CM

## 2022-02-26 DIAGNOSIS — E1169 Type 2 diabetes mellitus with other specified complication: Secondary | ICD-10-CM | POA: Diagnosis not present

## 2022-02-26 DIAGNOSIS — E1159 Type 2 diabetes mellitus with other circulatory complications: Secondary | ICD-10-CM

## 2022-02-26 LAB — COMPREHENSIVE METABOLIC PANEL
ALT: 16 U/L (ref 0–35)
AST: 20 U/L (ref 0–37)
Albumin: 4 g/dL (ref 3.5–5.2)
Alkaline Phosphatase: 87 U/L (ref 39–117)
BUN: 21 mg/dL (ref 6–23)
CO2: 25 mEq/L (ref 19–32)
Calcium: 9.5 mg/dL (ref 8.4–10.5)
Chloride: 102 mEq/L (ref 96–112)
Creatinine, Ser: 0.99 mg/dL (ref 0.40–1.20)
GFR: 58.72 mL/min — ABNORMAL LOW (ref >60.00)
Glucose, Bld: 232 mg/dL — ABNORMAL HIGH (ref 70–99)
Potassium: 4.4 mEq/L (ref 3.5–5.1)
Sodium: 136 mEq/L (ref 135–145)
Total Bilirubin: 0.6 mg/dL (ref 0.2–1.2)
Total Protein: 8.2 g/dL (ref 6.0–8.3)

## 2022-02-26 LAB — COLOGUARD: COLOGUARD: NEGATIVE

## 2022-02-26 MED ORDER — RYBELSUS 7 MG PO TABS: 7.0000 mg | ORAL_TABLET | Freq: Every day | ORAL | 3 refills | Status: DC

## 2022-02-26 MED ORDER — DAPAGLIFLOZIN PROPANEDIOL 10 MG PO TABS: 10.0000 mg | ORAL_TABLET | Freq: Every day | ORAL | 3 refills | Status: DC

## 2022-02-26 NOTE — Progress Notes (Signed)
Cathy Robbins 66 y.o.   Chief Complaint  Patient presents with  . Follow-up    4 wk f/u DM , no concerns     HISTORY OF PRESENT ILLNESS: This is a 66 y.o. female here for 4-week follow-up of uncontrolled diabetes. Presently taking Farxiga 10 mg and Rybelsus 7 mg daily. Accompanied by granddaughter who is helping with translation. Feeling better.  No complaints or medical concerns today.  HPI   Prior to Admission medications   Medication Sig Start Date End Date Taking? Authorizing Provider  atorvastatin (LIPITOR) 40 MG tablet Take 1 tablet (40 mg total) by mouth daily at 6 PM. 01/29/22  Yes Jaira Canady, Eilleen KempfMiguel Jose, MD  Blood Glucose Calibration (CONTOUR NEXT CONTROL) Normal SOLN 1 drop by In Vitro route as needed. 12/19/17  Yes McVey, Madelaine BhatElizabeth Whitney, PA-C  dapagliflozin propanediol (FARXIGA) 10 MG TABS tablet Take 1 tablet (10 mg total) by mouth daily before breakfast. 01/29/22 01/24/23 Yes Kainon Varady, Eilleen KempfMiguel Jose, MD  Finerenone (KERENDIA) 10 MG TABS Take 10 mg by mouth daily. 01/04/22 04/04/22 Yes Gerene Nedd, Eilleen KempfMiguel Jose, MD  gabapentin (NEURONTIN) 100 MG capsule Take 100 mg by mouth 3 (three) times daily.   Yes [provider]  lisinopril (ZESTRIL) 20 MG tablet Take 1 tablet (20 mg total) by mouth daily. 01/29/22  Yes Jazmynn Pho, Eilleen KempfMiguel Jose, MD  Semaglutide (RYBELSUS) 7 MG TABS Take 7 mg by mouth daily. 01/29/22  Yes SagardiaEilleen Kempf, Chayil Gantt Jose, MD    No Known Allergies  Patient Active Problem List   Diagnosis Date Noted  . Uncontrolled type 2 diabetes mellitus with hyperglycemia (HCC) 01/03/2022  . Hypertension associated with diabetes (HCC) 01/03/2022  . Chronic pain of both hips 07/14/2019  . Positive ANA (antinuclear antibody) 07/14/2019  . Dyslipidemia associated with type 2 diabetes mellitus (HCC) 11/20/2015  . Hyperlipidemia 11/20/2015    Past Medical History:  Diagnosis Date  . Diabetes mellitus without complication (HCC)     No past surgical history on file.  Social History    Socioeconomic History  . Marital status: Married    Spouse name: Not on file  . Number of children: 4  . Years of education: Not on file  . Highest education level: Not on file  Occupational History  . Not on file  Tobacco Use  . Smoking status: Never  . Smokeless tobacco: Never  Vaping Use  . Vaping Use: Never used  Substance and Sexual Activity  . Alcohol use: No  . Drug use: No  . Sexual activity: Not Currently  Other Topics Concern  . Not on file  Social History Narrative  . Not on file   Social Determinants of Health   Financial Resource Strain: Low Risk  (01/12/2019)   Overall Financial Resource Strain (CARDIA)   . Difficulty of Paying Living Expenses: Not hard at all  Food Insecurity: No Food Insecurity (01/12/2019)   Hunger Vital Sign   . Worried About Programme researcher, broadcasting/film/videounning Out of Food in the Last Year: Never true   . Ran Out of Food in the Last Year: Never true  Transportation Needs: No Transportation Needs (01/12/2019)   PRAPARE - Transportation   . Lack of Transportation (Medical): No   . Lack of Transportation (Non-Medical): No  Physical Activity: Sufficiently Active (01/12/2019)   Exercise Vital Sign   . Days of Exercise per Week: 5 days   . Minutes of Exercise per Session: 30 min  Stress: No Stress Concern Present (01/12/2019)   [employer] - Occupational  Stress Questionnaire   . Feeling of Stress : Not at all  Social Connections: Moderately Isolated (01/12/2019)   Social Connection and Isolation Panel [NHANES]   . Frequency of Communication with Friends and Family: Twice a week   . Frequency of Social Gatherings with Friends and Family: Once a week   . Attends Religious Services: Never   . Active Member of Clubs or Organizations: No   . Attends Banker Meetings: Never   . Marital Status: Married  Catering manager Violence: Not At Risk (01/12/2019)   Humiliation, Afraid, Rape, and Kick questionnaire   . Fear of Current or  Ex-Partner: No   . Emotionally Abused: No   . Physically Abused: No   . Sexually Abused: No    No family history on file.   Review of Systems  Constitutional: Negative.   HENT: Negative.  Negative for congestion and sore throat.   Respiratory: Negative.  Negative for cough and shortness of breath.   Cardiovascular:  Negative for chest pain and palpitations.  Gastrointestinal:  Negative for abdominal pain, diarrhea, nausea and vomiting.  Genitourinary: Negative.   Skin: Negative.  Negative for rash.  Neurological:  Negative for dizziness and headaches.  All other systems reviewed and are negative.  Today's Vitals   02/26/22 0925  BP: 136/72  Pulse: 67  Temp: 97.6 F (36.4 C)  TempSrc: Oral  SpO2: 97%  Weight: 98 lb 8 oz (44.7 kg)  Height:  (1.575 m)   Body mass index is 18.02 kg/m. Wt Readings from Last 3 Encounters:  02/26/22 98 lb 8 oz (44.7 kg)  01/29/22 100 lb 2 oz (45.4 kg)  01/03/22 100 lb (45.4 kg)     Physical Exam Vitals reviewed.  Constitutional:      Appearance: Normal appearance.  HENT:     Head: Normocephalic.     Mouth/Throat:     Mouth: Mucous membranes are moist.     Pharynx: Oropharynx is clear.  Eyes:     Extraocular Movements: Extraocular movements intact.     Pupils: Pupils are equal, round, and reactive to light.  Cardiovascular:     Rate and Rhythm: Normal rate and regular rhythm.     Pulses: Normal pulses.     Heart sounds: Normal heart sounds.  Pulmonary:     Effort: Pulmonary effort is normal.     Breath sounds: Normal breath sounds.  Abdominal:     Palpations: Abdomen is soft.     Tenderness: There is no abdominal tenderness.  Musculoskeletal:     Cervical back: No tenderness.  Lymphadenopathy:     Cervical: No cervical adenopathy.  Skin:    General: Skin is warm and dry.     Capillary Refill: Capillary refill takes less than 2 seconds.  Neurological:     General: No focal deficit present.     Mental Status: She is  alert and oriented to person, place, and time.  Psychiatric:        Mood and Affect: Mood normal.        Behavior: Behavior normal.     ASSESSMENT & PLAN: A total of 44 minutes was spent with the patient and counseling/coordination of care regarding preparing for this visit, review of most recent office visit notes, review of most recent blood work results, cardiovascular risks associated with uncontrolled diabetes, education on nutrition, review of all medications, need for repeat blood work today, prognosis, documentation, and need for follow-up.  Problem List Items Addressed This  Visit       Cardiovascular and Mediastinum   Hypertension associated with diabetes (HCC) - Primary    Well-controlled hypertension Continue lisinopril 20 mg daily BP Readings from Last 3 Encounters:  02/26/22 136/72  02/22/22 (!) 145/70  01/29/22 128/80  Clinically stable diabetes.  Tolerating medications well. Continue Farxiga 10 mg daily and Rybelsus 7 mg daily. Diet and nutrition discussed. Cardiovascular risk associated with uncontrolled diabetes discussed. Follow-up in 3 months.       Relevant Medications   dapagliflozin propanediol (FARXIGA) 10 MG TABS tablet   Semaglutide (RYBELSUS) 7 MG TABS   Other Relevant Orders   Comprehensive metabolic panel     Endocrine   Dyslipidemia associated with type 2 diabetes mellitus (HCC)    Stable.  Diet and nutrition discussed. Continue atorvastatin 40 mg daily. The 10-year ASCVD risk score (Arnett DK, et al., 2019) is: 21%   Values used to calculate the score:     Age: 62 years     Sex: Female     Is Non-Hispanic African American: No     Diabetic: Yes     Tobacco smoker: No     Systolic Blood Pressure: 136 mmHg     Is BP treated: Yes     HDL Cholesterol: 44.9 mg/dL     Total Cholesterol: 173 mg/dL       Relevant Medications   dapagliflozin propanediol (FARXIGA) 10 MG TABS tablet   Semaglutide (RYBELSUS) 7 MG TABS   Patient Instructions   B?nh ti?u du?ng v dinh du?ng, ngu?i l?n Diabetes Mellitus and Nutrition, Adult Khi qu v? b? ti?u du?ng hay b?nh ti?u du?ng, di?u r?t quan tr?ng l ph?i c thi quen an u?ng t?t cho s?c kh?e b?i v nh?ng g qu v? an v u?ng ?nh hu?ng l?n d?n n?ng d? du?ng huy?t (glucose) c?a qu v?. An th?c ph?m t?t cho s?c kh?e v?i s? lu?ng v?a ph?i, vo cng cc th?i di?m m?i ngy, c th? gip qu v?: Qu?n l du?ng huy?t. Gi?m nguy co b? b?nh tim. C?i thi?n huy?t p. ?t du?c ho?c duy tr muc cn n?ng c l?i cho s?c kh?e. i?u g c th? ?nh hu?ng d?n k? ho?ch an u?ng c?a ti? M?i ngu?i b? ti?u du?ng khc nhau v m?i ngu?i d?u c nhu c?u v? chuong trnh an u?ng khc nhau. Chuyn gia cham [place] s?c kh?e c?a qu v? c th? [place] qu v? trao d?i v?i chuyn gia dinh du?ng d? xy d?ng chuong trnh an u?ng t?t nh?t cho qu v?. Chuong trnh an u?ng c?a qu v? c th? thay d?i ty thu?c vo cc y?u t? nhu: Lu?ng calo quy vi? c?n. Cc lo?i thu?c m quy vi? du`ng. Cn na?ng cu?a quy vi?. N?ng d? du?ng huy?t, huy?t p v cholesterol c?a qu v?. M?c d? ho?t d?ng c?a qu v?. Cc tnh tr?ng s?c kh?e khc m qu v? c, ch?ng h?n nhu b?nh tim ho?c b?nh th?n. Carbohydrate ?nh hu?ng nhu th? no d?n ti? Carbohydrate, hay cn g?i l carb, ?nh hu?ng d?n lu?ng du?ng huy?t c?a quy vi? nhi?u hon b?t k? lo?i th?c ph?m no khc. An carb lm tang lu?ng glucose trong mu. Bi?t lu?ng carb m qu v? c th? an m?t cch an ton trong m?i b?a an c vai tr quan tr?ng. Lu?ng carbohydrate ny khc nhau v?i m?i ngu?i. Chuyn gia dinh du?ng c?a qu v? c th? gip tnh ton lu?ng carb m qu v? c?n ph?i an vo m?i b?a  an chnh v m?i b?a an nh?. Ru?u ?nh hu?ng d?n ti nhu th? no? Ru?u c th? gy gi?m du?ng huy?t (h? du?ng huy?t), d?c bi?t l n?u qu v? s? d?ng insulin ho?c u?ng m?t s? lo?i thu?c nh?t d?nh di?u tr? ti?u du?ng. H? du?ng huy?t c th? l m?t tnh tr?ng de d?a tnh [place]?ng. Cc tri?u ch?ng c?a h? du?ng huy?t, ch?ng h?n bu?n ng?,  chng m?t v l l?n, tuong t? cc tri?u ch?ng c?a vi?c ung qu nhi?u ru?u. Khng u?ng ru?u n?u: Chuyn gia cham [place] s?c kh?e khuyn qu v? khng u?ng ru?u. Qu v? c [place], c th? c [place], ho?c c k? ho?ch c [place]. N?u qu v? u?ng ru?u: Gi?i h?n lu?ng ru?u qu v? u?ng ? m?c: 0-1 ly/ngy d?i v?i n? gi?i. 0-2 ly/ngy d?i v?i nam gi?i. Bi?t m?t ly c bao nhiu ru?u. ? M?, m?t ly tuong duong v?i m?t chai bia 12 ao-xo (355 mL), m?t ly ru?u vang 5 ao-xo (148 mL), ho?c m?t ly ru?u m?nh 1 ao-xo (44 mL). Lun b d? nu?c b?ng nu?c, soda dnh cho ngu?i an [place], ho?c tr d khng du?ng. Lun nh? r?ng soda thng thuo`ng, nu?c tri cy v cac d` ung h~n ho?p khc c th? ch?a nhi?u du?ng v ph?i du?c tnh l carb. C nh?ng l?i khuyn no d? tun th? k? ho?ch ny?  ?c nhn th?c ph?m B?t d?u b?ng vi?c ki?m tra kch c? kh?u ph?n trn nhn Thng tin dinh du?ng c?a th?c ph?m v d? u?ng dng gi. S? calo v lu?ng carb, ch?t bo v cc ch?t dinh du?ng khc ghi trn nhn d?a trn m?t kh?u ph?n c?a mn d. Nhi?u mn ch?a nhi?u hon m?t kh?u ph?n trn m?i bao b. Ki?m tra t?ng s? gram (g) carb trong m?t kh?u ph?n. Ki?m tra s? gram ch?t bo bo ha v ch?t bo chuy?n ha trong m?t kh?u ph?n. Ch?n th?c ph?m c t ho?c khng c nh?ng ch?t bo ny. Ki?m tra s? miligam (mg) mu?i (natri) trong m?t kh?u ph?n. H?u h?t m?i ngu?i d?u nn gi?i h?n t?ng natri tiu thu? o? muc du?i 2.300 mg m?i ngy. Lun ki?m tra thng tin dinh du?ng c?a th?c ph?m ghi trn nhn l "t bo" hay "khng bo". Nh?ng th?c ph?m ny c th? c b? sung du?ng ho?c carb tinh luy?n cao hon v c?n ph?i trnh. Hy trao d?i v?i chuyn gia dinh du?ng c?a qu v? d? xc d?nh m?c tiu hng ngy c?a qu v? d?i v?i cc ch?t dinh du?ng li?t k trn nhn. Mua s?m Trnh mua nh?ng th?c ph?m dng h?p, lm s?n ho?c ch? bi?n s?n. Nh?ng th?c ph?m ny c xu hu?ng c nhi?u ch?t bo, natri, b? sung du?ng. Mua s?m quanh ra ngoi c?a c?a hng t?p ha. y l noi qu v? s?  d? tm th?y tri cy v rau c? tuoi, nhi?u lo?i h?t, th?t tuoi v s?n ph?m s?a tuoi. N?u nu?ng S? d?ng cac phuong php n?u an nhi?t d? th?p, ch?ng h?n nhu nu?ng, thay v phuong php n?u nhi?t d? cao nhu chin ng?p d`u. N?u an b?ng cch s? d?ng cc lo?i d?u t?t cho s?c kh?e, ch?ng h?n nhu d?u  liu, d?u h?t c?i d?u ho?c d?u hu?ng duong. [place] n?u v?i bo, kem, ho?c th?t nhi?u m?. Ln k? ho?ch cho b?a an An cc b?a an chnh v cc b?a an nh? d?u d?n, t?t nh?t l vo cng m?t th?i di?m m?i ngy. [place]  nhi?n an trong th?i [employer]. An th?c an giu ch?t xo nhu cc lo?i tri cy tuoi, rau c?, d?u v ngu c?c nguyn cm. An 4-6 ao-xo (oz) (112-168 g) protein n?c m?i ngy, ch?ng h?n nhu th?t n?c, th?t g, c, tr?ng, ho?c d?u ph?. M?t ao-xo (oz) (28 g) protein n?c b?ng: 1 ao-xo (oz) (28 g) th?t, th?t g ho?c c. 1 qu? tr?ng. 1/4 c?c (62 g) d?u ph?. An m?t s? th?c ph?m ch?a cc ch?t bo lnh m?nh m?i ngy, ch?ng h?n nhu qu? bo, qu? h?ch, cc lo?i h?t v c. Ti nn an nh?ng th?c ph?m no? Tri cy Qu? m?ng. To. [place]? do. Qu? mo. Qu? m?n. Nho. Xoi. Qu? du d?[place]? l?u. Qu? kiwi. Qu? anh do. Derrek Gu c? Ferne Coe, bao g?m c?i xoan, rau bina, kale, c?i c?u v?ng, c?i r? c?i b? xanh v c?i b?p. C? c?i du?ng. Sp lo. Bng c?i xanh. C r?t. ?u xanh. C chua. ?t ng?t. Hnh ty. [place] chu?t. C?i bruxen. Ngu c?c Ngu c?c nguyn cam nhu bnh m nguyn cam hoa?c nguyn h?t, bnh quy gin, bnh m ng, ngu c?c v m ?ng. B?t y?n m?ch khng du?ng. H?t dim m?ch (quinoa). G?o l?c ho?c g?o khng xt. Th?t v cc protein khc H?i s?n. Th?t gia c?m khng da. Mi?ng n?c th?t gia c?m v th?t b. ?u ph?. Qu? h?ch. Cc lo?i h?t. S?a Cc s?n ph?m su~a khng co hoa?c co it cht beo, ch?ng h?n nhu s?a, su~a chua va` pho mt. Nh?ng th?c ph?m li?t k ? trn c th? khng ph?i l m?t danh m?c d?y d? cc th?c ph?m v d? u?ng m qu v? c th? an v u?ng. Lin h? v?i chuyn gia dinh du?ng d? c thm thng tin. Ti nn trnh  nh?ng th?c ph?m no? Tri cy Tri cy dng h?p xi-r. Derrek Gu c? Derrek Gu c? dng h?p. Rau dng l?nh v?i bo ho?c nu?c s?t kem. Ngu c?c Cc s?n ph?m lm t? b?t m tr?ng v b?t m du?c tinh ch? nhu bnh m, m ?ng, d? an nh? v ngu c?c. Trnh t?t c? cc th?c ph?m ch? bi?n s?n. Th?t v cc protein khc Cc lat thi?t mo~. Th?t gia c?m c da. Th?t chin/rn ho?c t?m b?t rn. Th?t ch? bi?n s?n. Trnh cc ch?t bo bo ha. S?a S?a chua nguyn ch?t bo, pho mt ho?c s?a. ? u?ng ? u?ng c du?ng, ch?ng h?n nhu soda ho?c tr d. Nh?ng th?c ph?m li?t k ? trn c th? khng ph?i l m?t danh m?c d?y d? cc lo?i th?c ph?m v d? u?ng m qu v? nn trnh. Lin h? v?i chuyn gia dinh du?ng d? c thm thng tin. Cac cu h?i c?n dua ra v?i chuyn gia cham [place] s?c kh?e Ti c c?n g?p chuyn gia gio d?c v cham [place] b?nh ti?u du?ng c ch?ng nh?n khng? Ti c c?n g?p chuyn gia dinh du?ng khng? Ti co th? go?i cho s na`o nu ti co th?c m?c? Khi no l th?i di?m ki?m tra du?ng huy?t t?t nh?t? Noi d? tm thm thng tin: American Diabetes Association (Hi?p h?i Ti?u [employer] K?): diabetes.org Academy of Nutrition and Dietetics (H?c vi?n Dinh du?ng v An u?ng): eatright.[employer] of Diabetes and Digestive and Kidney Diseases (Vi?n Ti?u du?ng v cc B?nh Tiu ha v B?nh Th?n Qu?c gia): StageSync.si Association of Diabetes Care & Education Specialists (Hi?p h?i Chuyn gia Gio d?c v Cham  B?nh ti?u du?ng):  diabeteseducator.org Tm t?t i?u r?t quan tr?ng l ph?i c thi quen an u?ng t?t cho s?c kh?e b?i v nh?ng g qu v? an v u?ng ?nh hu?ng l?n d?n n?ng d? du?ng huy?t (glucose) c?a qu v?. i?u quan tr?ng l s? d?ng m?t ru?u cch th?n tr?ng. M?t k? ho?ch an u?ng t?t cho s?c kh?e s? gip qu v? ki?m sot du?ng huy?t v gi?m nguy co m?c b?nh tim. Chuyn gia cham [place] s?c kh?e c?a qu v? c th? [place] qu v? trao d?i v?i chuyn gia dinh du?ng d? xy d?ng chuong trnh an u?ng t?t nh?t cho qu  v?. Thng tin ny khng nh?m m?c dch thay th? cho l?i khuyn m chuyn gia cham [place] s?c kh?e ni v?i qu v?. Hy b?o d?m qu v? ph?i th?o lu?n b?t k? v?n d? g m qu v? c v?i chuyn gia cham [place] s?c kh?e c?a qu v?. Document Revised: 11/16/2020 Document Reviewed: 11/16/2020 Elsevier Patient Education  2023 Elsevier Inc.    Edwina Barth, MD [facility]

## 2022-02-26 NOTE — Patient Instructions (Signed)
B?nh ti?u ???ng v dinh d??ng, ng??i l?n Diabetes Mellitus and Nutrition, Adult Khi qu v? b? ti?u ???ng hay b?nh ti?u ???ng, ?i?u r?t quan tr?ng l ph?i c thi quen ?n u?ng t?t cho s?c kh?e b?i v nh?ng g qu v? ?n v u?ng ?nh h??ng l?n ??n n?ng ?? ???ng huy?t (glucose) c?a qu v?. ?n th?c ph?m t?t cho s?c kh?e v?i s? l??ng v?a ph?i, vo cng cc th?i ?i?m m?i ngy, c th? gip qu v?: Qu?n l ???ng huy?t. Gi?m nguy c? b? b?nh tim. C?i thi?n huy?t p. ??t ???c ho?c duy tr m??c cn n?ng c l?i cho s?c kh?e. ?i?u g c th? ?nh h??ng ??n k? ho?ch ?n u?ng c?a ti? M?i ng??i b? ti?u ???ng khc nhau v m?i ng??i ??u c nhu c?u v? ch??ng trnh ?n u?ng khc nhau. Chuyn gia ch?m sc s?c kh?e c?a qu v? c th? khuyn qu v? trao ??i v?i chuyn gia dinh d??ng ?? xy d?ng ch??ng trnh ?n u?ng t?t nh?t cho qu v?. Ch??ng trnh ?n u?ng c?a qu v? c th? thay ??i ty thu?c vo cc y?u t? nh?: L??ng calo quy? vi? c?n. Cc lo?i thu?c m quy? vi? du?ng. Cn n??ng cu?a quy? vi?. N?ng ?? ???ng huy?t, huy?t p v cholesterol c?a qu v?. M?c ?? ho?t ??ng c?a qu v?. Cc tnh tr?ng s?c kh?e khc m qu v? c, ch?ng h?n nh? b?nh tim ho?c b?nh th?n. Carbohydrate ?nh h??ng nh? th? no ??n ti? Carbohydrate, hay cn g?i l carb, ?nh h??ng ??n l??ng ???ng huy?t c?a quy? vi? nhi?u h?n b?t k? lo?i th?c ph?m no khc. ?n carb lm t?ng l??ng glucose trong mu. Bi?t l??ng carb m qu v? c th? ?n m?t cch an ton trong m?i b?a ?n c vai tr quan tr?ng. L??ng carbohydrate ny khc nhau v?i m?i ng??i. Chuyn gia dinh d??ng c?a qu v? c th? gip tnh ton l??ng carb m qu v? c?n ph?i ?n vo m?i b?a ?n chnh v m?i b?a ?n nh?. R??u ?nh h??ng ??n ti nh? th? no? R??u c th? gy gi?m ???ng huy?t (h? ???ng huy?t), ??c bi?t l n?u qu v? s? d?ng insulin ho?c u?ng m?t s? lo?i thu?c nh?t ??nh ?i?u tr? ti?u ???ng. H? ???ng huy?t c th? l m?t tnh tr?ng ?e d?a tnh Prue?ng. Cc tri?u ch?ng c?a h? ???ng huy?t, ch?ng h?n bu?n ng?, chng  m?t v l l?n, t??ng t? cc tri?u ch?ng c?a vi?c u?ng qu nhi?u r??u. Khng u?ng r??u n?u: Chuyn gia ch?m sc s?c kh?e khuyn qu v? khng u?ng r??u. Qu v? c thai, c th? c thai, ho?c c k? ho?ch c thai. N?u qu v? u?ng r??u: Gi?i h?n l??ng r??u qu v? u?ng ? m?c: 0-1 ly/ngy ??i v?i n? gi?i. 0-2 ly/ngy ??i v?i nam gi?i. Bi?t m?t ly c bao nhiu r??u. ? M?, m?t ly t??ng ???ng v?i m?t chai bia 12 ao-x? (355 mL), m?t ly r??u vang 5 ao-x? (148 mL), ho?c m?t ly r??u m?nh 1 ao-x? (44 mL). Lun b ?? n??c b?ng n??c, soda dnh cho ng??i ?n king, ho?c tr ? khng ???ng. Lun nh? r?ng soda thng th???ng, n??c tri cy v ca?c ?? u?ng h?n h??p khc c th? ch?a nhi?u ???ng v ph?i ???c tnh l carb. C nh?ng l?i khuyn no ?? tun th? k? ho?ch ny?  ??c nhn th?c ph?m B?t ??u b?ng vi?c ki?m tra kch c? kh?u ph?n trn nhn Thng tin dinh d??ng c?a th?c   ph?m v ?? u?ng ?ng gi. S? calo v l??ng carb, ch?t bo v cc ch?t dinh d??ng khc ghi trn nhn d?a trn m?t kh?u ph?n c?a mn ?. Nhi?u mn ch?a nhi?u h?n m?t kh?u ph?n trn m?i bao b. Ki?m tra t?ng s? gram (g) carb trong m?t kh?u ph?n. Ki?m tra s? gram ch?t bo bo ha v ch?t bo chuy?n ha trong m?t kh?u ph?n. Ch?n th?c ph?m c t ho?c khng c nh?ng ch?t bo ny. Ki?m tra s? miligam (mg) mu?i (natri) trong m?t kh?u ph?n. H?u h?t m?i ng??i ??u nn gi?i h?n t?ng natri tiu thu? ?? m??c d??i 2.300 mg m?i ngy. Lun ki?m tra thng tin dinh d??ng c?a th?c ph?m ghi trn nhn l "t bo" hay "khng bo". Nh?ng th?c ph?m ny c th? c b? sung ???ng ho?c carb tinh luy?n cao h?n v c?n ph?i trnh. Hy trao ??i v?i chuyn gia dinh d??ng c?a qu v? ?? xc ??nh m?c tiu hng ngy c?a qu v? ??i v?i cc ch?t dinh d??ng li?t k trn nhn. Mua s?m Trnh mua nh?ng th?c ph?m ?ng h?p, lm s?n ho?c ch? bi?n s?n. Nh?ng th?c ph?m ny c xu h??ng c nhi?u ch?t bo, natri, b? sung ???ng. Mua s?m quanh ra ngoi c?a c?a hng t?p ha. ?y l n?i qu v? s? d?  tm th?y tri cy v rau c? t??i, nhi?u lo?i h?t, th?t t??i v s?n ph?m s?a t??i. N?u n??ng S? d?ng ca?c ph??ng php n?u ?n nhi?t ?? th?p, ch?ng h?n nh? n??ng, thay v ph??ng php n?u nhi?t ?? cao nh? chin ng?p d?u. N?u ?n b?ng cch s? d?ng cc lo?i d?u t?t cho s?c kh?e, ch?ng h?n nh? d?u  liu, d?u h?t c?i d?u ho?c d?u h??ng d??ng. Trnh n?u v?i b?, kem, ho?c th?t nhi?u m?. Ln k? ho?ch cho b?a ?n ?n cc b?a ?n chnh v cc b?a ?n nh? ??u ??n, t?t nh?t l vo cng m?t th?i ?i?m m?i ngy. Trnh nhi?n ?n trong th?i gian di. ?n th?c ?n giu ch?t x? nh? cc lo?i tri cy t??i, rau c?, ??u v ng? c?c nguyn cm. ?n 4-6 ao-x? (oz) (112-168 g) protein n?c m?i ngy, ch?ng h?n nh? th?t n?c, th?t g, c, tr?ng, ho?c ??u ph?. M?t ao-x? (oz) (28 g) protein n?c b?ng: 1 ao-x? (oz) (28 g) th?t, th?t g ho?c c. 1 qu? tr?ng. 1?4 c?c (62 g) ??u ph?. ?n m?t s? th?c ph?m ch?a cc ch?t bo lnh m?nh m?i ngy, ch?ng h?n nh? qu? b?, qu? h?ch, cc lo?i h?t v c. Ti nn ?n nh?ng th?c ph?m no? Tri cy Qu? m?ng. To. Cam. Qu? ?o. Qu? m?. Qu? m?n. Nho. Xoi. Qu? ?u ??. Qu? l?u. Qu? kiwi. Qu? anh ?o. Rau c? Rau xanh, bao g?m c?i xo?n, rau bina, kale, c?i c?u v?ng, c?i r? c?i b? xanh v c?i b?p. C? c?i ???ng. Sp l?. Bng c?i xanh. C r?t. ??u xanh. C chua. ?t ng?t. Hnh ty. D?a chu?t. C?i bruxen. Ng? c?c Ng? c?c nguyn ca?m nh? bnh m nguyn ca?m ho??c nguyn h?t, bnh quy gin, bnh m ng, ng? c?c v m ?ng. B?t y?n m?ch khng ???ng. H?t dim m?ch (quinoa). G?o l?c ho?c g?o khng xt. Th?t v cc protein khc H?i s?n. Th?t gia c?m khng da. Mi?ng n?c th?t gia c?m v th?t b. ??u ph?. Qu? h?ch. Cc lo?i h?t. S?a Cc s?n ph?m s??a khng co? ho??c co? i?t ch?t be?o, ch?ng h?n nh? s?a, s??a   chua va? pho mt. Nh?ng th?c ph?m li?t k ? trn c th? khng ph?i l m?t danh m?c ??y ?? cc th?c ph?m v ?? u?ng m qu v? c th? ?n v u?ng. Lin h? v?i chuyn gia dinh d??ng ?? c thm thng tin. Ti nn trnh  nh?ng th?c ph?m no? Tri cy Tri cy ?ng h?p xi-r. Rau c? Rau c? ?ng h?p. Rau ?ng l?nh v?i b? ho?c n??c s?t kem. Ng? c?c Cc s?n ph?m lm t? b?t m tr?ng v b?t m ???c tinh ch? nh? bnh m, m ?ng, ?? ?n nh? v ng? c?c. Trnh t?t c? cc th?c ph?m ch? bi?n s?n. Th?t v cc protein khc Cc la?t thi?t m??. Th?t gia c?m c da. Th?t chin/rn ho?c t?m b?t rn. Th?t ch? bi?n s?n. Trnh cc ch?t bo bo ha. S?a S?a chua nguyn ch?t bo, pho mt ho?c s?a. ?? u?ng ?? u?ng c ???ng, ch?ng h?n nh? soda ho?c tr ?. Nh?ng th?c ph?m li?t k ? trn c th? khng ph?i l m?t danh m?c ??y ?? cc lo?i th?c ph?m v ?? u?ng m qu v? nn trnh. Lin h? v?i chuyn gia dinh d??ng ?? c thm thng tin. Ca?c cu h?i c?n ??a ra v?i chuyn gia ch?m sc s?c kh?e Ti c c?n g?p chuyn gia gio d?c v ch?m sc b?nh ti?u ???ng c ch?ng nh?n khng? Ti c c?n g?p chuyn gia dinh d??ng khng? Ti co? th? go?i cho s? na?o n?u ti co? th?c m?c? Khi no l th?i ?i?m ki?m tra ???ng huy?t t?t nh?t? N?i ?? tm thm thng tin: American Diabetes Association (Hi?p h?i Ti?u ????ng Hoa K?): diabetes.org Academy of Nutrition and Dietetics (H?c vi?n Dinh d??ng v ?n u?ng): [email or web address] National Institute of Diabetes and Digestive and Kidney Diseases (Vi?n Ti?u ???ng v cc B?nh Tiu ha v B?nh Th?n Qu?c gia): [email or web address] Association of Diabetes Care & Education Specialists (Hi?p h?i Chuyn gia Gio d?c v Ch?m sc B?nh ti?u ???ng): [email or web address] Tm t?t ?i?u r?t quan tr?ng l ph?i c thi quen ?n u?ng t?t cho s?c kh?e b?i v nh?ng g qu v? ?n v u?ng ?nh h??ng l?n ??n n?ng ?? ???ng huy?t (glucose) c?a qu v?. ?i?u quan tr?ng l s? d?ng m?t r??u cch th?n tr?ng. M?t k? ho?ch ?n u?ng t?t cho s?c kh?e s? gip qu v? ki?m sot ???ng huy?t v gi?m nguy c? m?c b?nh tim. Chuyn gia ch?m sc s?c kh?e c?a qu v? c th? khuyn qu v? trao ??i v?i chuyn gia dinh d??ng ?? xy d?ng ch??ng trnh ?n u?ng t?t nh?t cho qu  v?. Thng tin ny khng nh?m m?c ?ch thay th? cho l?i khuyn m chuyn gia ch?m sc s?c kh?e ni v?i qu v?. Hy b?o ??m qu v? ph?i th?o lu?n b?t k? v?n ?? g m qu v? c v?i chuyn gia ch?m sc s?c kh?e c?a qu v?. Document Revised: 11/16/2020 Document Reviewed: 11/16/2020 Elsevier Patient Education  2023 [employer].  

## 2022-02-26 NOTE — Assessment & Plan Note (Signed)
Well-controlled hypertension Continue lisinopril 20 mg daily BP Readings from Last 3 Encounters:  02/26/22 136/72  02/22/22 (!) 145/70  01/29/22 128/80  Clinically stable diabetes.  Tolerating medications well. Continue Farxiga 10 mg daily and Rybelsus 7 mg daily. Diet and nutrition discussed. Cardiovascular risk associated with uncontrolled diabetes discussed. Follow-up in 3 months.

## 2022-02-26 NOTE — Assessment & Plan Note (Signed)
Stable.  Diet and nutrition discussed. Continue atorvastatin 40 mg daily. The 10-year ASCVD risk score (Arnett DK, et al., 2019) is: 21%   Values used to calculate the score:     Age: 66 years     Sex: Female     Is Non-Hispanic African American: No     Diabetic: Yes     Tobacco smoker: No     Systolic Blood Pressure: 136 mmHg     Is BP treated: Yes     HDL Cholesterol: 44.9 mg/dL     Total Cholesterol: 173 mg/dL

## 2022-04-18 ENCOUNTER — Other Ambulatory Visit: Payer: Self-pay

## 2022-04-18 DIAGNOSIS — E119 Type 2 diabetes mellitus without complications: Secondary | ICD-10-CM

## 2022-04-18 MED ORDER — GABAPENTIN 100 MG PO CAPS: 100.0000 mg | ORAL_CAPSULE | Freq: Three times a day (TID) | ORAL | 1 refills | Status: DC

## 2022-06-04 ENCOUNTER — Ambulatory Visit (INDEPENDENT_AMBULATORY_CARE_PROVIDER_SITE_OTHER): Payer: Medicare Other | Admitting: Emergency Medicine

## 2022-06-04 ENCOUNTER — Encounter: Payer: Self-pay | Admitting: Emergency Medicine

## 2022-06-04 VITALS — BP 136/80 | HR 70 | Temp 98.4°F | Ht 62.0 in | Wt 101.2 lb

## 2022-06-04 DIAGNOSIS — N1831 Chronic kidney disease, stage 3a: Secondary | ICD-10-CM

## 2022-06-04 DIAGNOSIS — I152 Hypertension secondary to endocrine disorders: Secondary | ICD-10-CM

## 2022-06-04 DIAGNOSIS — E1169 Type 2 diabetes mellitus with other specified complication: Secondary | ICD-10-CM

## 2022-06-04 DIAGNOSIS — E785 Hyperlipidemia, unspecified: Secondary | ICD-10-CM

## 2022-06-04 DIAGNOSIS — Z1231 Encounter for screening mammogram for malignant neoplasm of breast: Secondary | ICD-10-CM | POA: Diagnosis not present

## 2022-06-04 DIAGNOSIS — E1159 Type 2 diabetes mellitus with other circulatory complications: Secondary | ICD-10-CM | POA: Diagnosis not present

## 2022-06-04 DIAGNOSIS — E1165 Type 2 diabetes mellitus with hyperglycemia: Secondary | ICD-10-CM

## 2022-06-04 HISTORY — DX: Chronic kidney disease, stage 3a: N18.31

## 2022-06-04 LAB — POCT GLYCOSYLATED HEMOGLOBIN (HGB A1C): Hemoglobin A1C: 10.9 % — AB (ref 4.0–5.6)

## 2022-06-04 MED ORDER — GLIPIZIDE 5 MG PO TABS: 5.0000 mg | ORAL_TABLET | Freq: Two times a day (BID) | ORAL | 3 refills | Status: DC

## 2022-06-04 NOTE — Patient Instructions (Signed)
B?nh ti?u ???ng v dinh d??ng, ng??i l?n Diabetes Mellitus and Nutrition, Adult Khi qu v? b? ti?u ???ng hay b?nh ti?u ???ng, ?i?u r?t quan tr?ng l ph?i c thi quen ?n u?ng t?t cho s?c kh?e b?i v nh?ng g qu v? ?n v u?ng ?nh h??ng l?n ??n n?ng ?? ???ng huy?t (glucose) c?a qu v?. ?n th?c ph?m t?t cho s?c kh?e v?i s? l??ng v?a ph?i, vo cng cc th?i ?i?m m?i ngy, c th? gip qu v?: Qu?n l ???ng huy?t. Gi?m nguy c? b? b?nh tim. C?i thi?n huy?t p. ??t ???c ho?c duy tr m??c cn n?ng c l?i cho s?c kh?e. ?i?u g c th? ?nh h??ng ??n k? ho?ch ?n u?ng c?a ti? M?i ng??i b? ti?u ???ng khc nhau v m?i ng??i ??u c nhu c?u v? ch??ng trnh ?n u?ng khc nhau. Chuyn gia ch?m sc s?c kh?e c?a qu v? c th? khuyn qu v? trao ??i v?i chuyn gia dinh d??ng ?? xy d?ng ch??ng trnh ?n u?ng t?t nh?t cho qu v?. Ch??ng trnh ?n u?ng c?a qu v? c th? thay ??i ty thu?c vo cc y?u t? nh?: L??ng calo quy? vi? c?n. Cc lo?i thu?c m quy? vi? du?ng. Cn n??ng cu?a quy? vi?. N?ng ?? ???ng huy?t, huy?t p v cholesterol c?a qu v?. M?c ?? ho?t ??ng c?a qu v?. Cc tnh tr?ng s?c kh?e khc m qu v? c, ch?ng h?n nh? b?nh tim ho?c b?nh th?n. Carbohydrate ?nh h??ng nh? th? no ??n ti? Carbohydrate, hay cn g?i l carb, ?nh h??ng ??n l??ng ???ng huy?t c?a quy? vi? nhi?u h?n b?t k? lo?i th?c ph?m no khc. ?n carb lm t?ng l??ng glucose trong mu. Bi?t l??ng carb m qu v? c th? ?n m?t cch an ton trong m?i b?a ?n c vai tr quan tr?ng. L??ng carbohydrate ny khc nhau v?i m?i ng??i. Chuyn gia dinh d??ng c?a qu v? c th? gip tnh ton l??ng carb m qu v? c?n ph?i ?n vo m?i b?a ?n chnh v m?i b?a ?n nh?. R??u ?nh h??ng ??n ti nh? th? no? R??u c th? gy gi?m ???ng huy?t (h? ???ng huy?t), ??c bi?t l n?u qu v? s? d?ng insulin ho?c u?ng m?t s? lo?i thu?c nh?t ??nh ?i?u tr? ti?u ???ng. H? ???ng huy?t c th? l m?t tnh tr?ng ?e d?a tnh Loden?ng. Cc tri?u ch?ng c?a h? ???ng huy?t, ch?ng h?n bu?n ng?, chng  m?t v l l?n, t??ng t? cc tri?u ch?ng c?a vi?c u?ng qu nhi?u r??u. Khng u?ng r??u n?u: Chuyn gia ch?m sc s?c kh?e khuyn qu v? khng u?ng r??u. Qu v? c thai, c th? c thai, ho?c c k? ho?ch c thai. N?u qu v? u?ng r??u: Gi?i h?n l??ng r??u qu v? u?ng ? m?c: 0-1 ly/ngy ??i v?i n? gi?i. 0-2 ly/ngy ??i v?i nam gi?i. Bi?t m?t ly c bao nhiu r??u. ? M?, m?t ly t??ng ???ng v?i m?t chai bia 12 ao-x? (355 mL), m?t ly r??u vang 5 ao-x? (148 mL), ho?c m?t ly r??u m?nh 1 ao-x? (44 mL). Lun b ?? n??c b?ng n??c, soda dnh cho ng??i ?n king, ho?c tr ? khng ???ng. Lun nh? r?ng soda thng th???ng, n??c tri cy v ca?c ?? u?ng h?n h??p khc c th? ch?a nhi?u ???ng v ph?i ???c tnh l carb. C nh?ng l?i khuyn no ?? tun th? k? ho?ch ny?  ??c nhn th?c ph?m B?t ??u b?ng vi?c ki?m tra kch c? kh?u ph?n trn nhn Thng tin dinh d??ng c?a th?c   ph?m v ?? u?ng ?ng gi. S? calo v l??ng carb, ch?t bo v cc ch?t dinh d??ng khc ghi trn nhn d?a trn m?t kh?u ph?n c?a mn ?. Nhi?u mn ch?a nhi?u h?n m?t kh?u ph?n trn m?i bao b. Ki?m tra t?ng s? gram (g) carb trong m?t kh?u ph?n. Ki?m tra s? gram ch?t bo bo ha v ch?t bo chuy?n ha trong m?t kh?u ph?n. Ch?n th?c ph?m c t ho?c khng c nh?ng ch?t bo ny. Ki?m tra s? miligam (mg) mu?i (natri) trong m?t kh?u ph?n. H?u h?t m?i ng??i ??u nn gi?i h?n t?ng natri tiu thu? ?? m??c d??i 2.300 mg m?i ngy. Lun ki?m tra thng tin dinh d??ng c?a th?c ph?m ghi trn nhn l "t bo" hay "khng bo". Nh?ng th?c ph?m ny c th? c b? sung ???ng ho?c carb tinh luy?n cao h?n v c?n ph?i trnh. Hy trao ??i v?i chuyn gia dinh d??ng c?a qu v? ?? xc ??nh m?c tiu hng ngy c?a qu v? ??i v?i cc ch?t dinh d??ng li?t k trn nhn. Mua s?m Trnh mua nh?ng th?c ph?m ?ng h?p, lm s?n ho?c ch? bi?n s?n. Nh?ng th?c ph?m ny c xu h??ng c nhi?u ch?t bo, natri, b? sung ???ng. Mua s?m quanh ra ngoi c?a c?a hng t?p ha. ?y l n?i qu v? s? d?  tm th?y tri cy v rau c? t??i, nhi?u lo?i h?t, th?t t??i v s?n ph?m s?a t??i. N?u n??ng S? d?ng ca?c ph??ng php n?u ?n nhi?t ?? th?p, ch?ng h?n nh? n??ng, thay v ph??ng php n?u nhi?t ?? cao nh? chin ng?p d?u. N?u ?n b?ng cch s? d?ng cc lo?i d?u t?t cho s?c kh?e, ch?ng h?n nh? d?u  liu, d?u h?t c?i d?u ho?c d?u h??ng d??ng. Trnh n?u v?i b?, kem, ho?c th?t nhi?u m?. Ln k? ho?ch cho b?a ?n ?n cc b?a ?n chnh v cc b?a ?n nh? ??u ??n, t?t nh?t l vo cng m?t th?i ?i?m m?i ngy. Trnh nhi?n ?n trong th?i gian di. ?n th?c ?n giu ch?t x? nh? cc lo?i tri cy t??i, rau c?, ??u v ng? c?c nguyn cm. ?n 4-6 ao-x? (oz) (112-168 g) protein n?c m?i ngy, ch?ng h?n nh? th?t n?c, th?t g, c, tr?ng, ho?c ??u ph?. M?t ao-x? (oz) (28 g) protein n?c b?ng: 1 ao-x? (oz) (28 g) th?t, th?t g ho?c c. 1 qu? tr?ng. 1?4 c?c (62 g) ??u ph?. ?n m?t s? th?c ph?m ch?a cc ch?t bo lnh m?nh m?i ngy, ch?ng h?n nh? qu? b?, qu? h?ch, cc lo?i h?t v c. Ti nn ?n nh?ng th?c ph?m no? Tri cy Qu? m?ng. To. Cam. Qu? ?o. Qu? m?. Qu? m?n. Nho. Xoi. Qu? ?u ??. Qu? l?u. Qu? kiwi. Qu? anh ?o. Rau c? Rau xanh, bao g?m c?i xo?n, rau bina, kale, c?i c?u v?ng, c?i r? c?i b? xanh v c?i b?p. C? c?i ???ng. Sp l?. Bng c?i xanh. C r?t. ??u xanh. C chua. ?t ng?t. Hnh ty. D?a chu?t. C?i bruxen. Ng? c?c Ng? c?c nguyn ca?m nh? bnh m nguyn ca?m ho??c nguyn h?t, bnh quy gin, bnh m ng, ng? c?c v m ?ng. B?t y?n m?ch khng ???ng. H?t dim m?ch (quinoa). G?o l?c ho?c g?o khng xt. Th?t v cc protein khc H?i s?n. Th?t gia c?m khng da. Mi?ng n?c th?t gia c?m v th?t b. ??u ph?. Qu? h?ch. Cc lo?i h?t. S?a Cc s?n ph?m s??a khng co? ho??c co? i?t ch?t be?o, ch?ng h?n nh? s?a, s??a   chua va? pho mt. Nh?ng th?c ph?m li?t k ? trn c th? khng ph?i l m?t danh m?c ??y ?? cc th?c ph?m v ?? u?ng m qu v? c th? ?n v u?ng. Lin h? v?i chuyn gia dinh d??ng ?? c thm thng tin. Ti nn trnh  nh?ng th?c ph?m no? Tri cy Tri cy ?ng h?p xi-r. Rau c? Rau c? ?ng h?p. Rau ?ng l?nh v?i b? ho?c n??c s?t kem. Ng? c?c Cc s?n ph?m lm t? b?t m tr?ng v b?t m ???c tinh ch? nh? bnh m, m ?ng, ?? ?n nh? v ng? c?c. Trnh t?t c? cc th?c ph?m ch? bi?n s?n. Th?t v cc protein khc Cc la?t thi?t m??. Th?t gia c?m c da. Th?t chin/rn ho?c t?m b?t rn. Th?t ch? bi?n s?n. Trnh cc ch?t bo bo ha. S?a S?a chua nguyn ch?t bo, pho mt ho?c s?a. ?? u?ng ?? u?ng c ???ng, ch?ng h?n nh? soda ho?c tr ?. Nh?ng th?c ph?m li?t k ? trn c th? khng ph?i l m?t danh m?c ??y ?? cc lo?i th?c ph?m v ?? u?ng m qu v? nn trnh. Lin h? v?i chuyn gia dinh d??ng ?? c thm thng tin. Ca?c cu h?i c?n ??a ra v?i chuyn gia ch?m sc s?c kh?e Ti c c?n g?p chuyn gia gio d?c v ch?m sc b?nh ti?u ???ng c ch?ng nh?n khng? Ti c c?n g?p chuyn gia dinh d??ng khng? Ti co? th? go?i cho s? na?o n?u ti co? th?c m?c? Khi no l th?i ?i?m ki?m tra ???ng huy?t t?t nh?t? N?i ?? tm thm thng tin: American Diabetes Association (Hi?p h?i Ti?u ????ng Hoa K?): diabetes.org Academy of Nutrition and Dietetics (H?c vi?n Dinh d??ng v ?n u?ng): [email or web address].org National Institute of Diabetes and Digestive and Kidney Diseases (Vi?n Ti?u ???ng v cc B?nh Tiu ha v B?nh Th?n Qu?c gia): [email or web address] Association of Diabetes Care & Education Specialists (Hi?p h?i Chuyn gia Gio d?c v Ch?m sc B?nh ti?u ???ng): [email or web address] Tm t?t ?i?u r?t quan tr?ng l ph?i c thi quen ?n u?ng t?t cho s?c kh?e b?i v nh?ng g qu v? ?n v u?ng ?nh h??ng l?n ??n n?ng ?? ???ng huy?t (glucose) c?a qu v?. ?i?u quan tr?ng l s? d?ng m?t r??u cch th?n tr?ng. M?t k? ho?ch ?n u?ng t?t cho s?c kh?e s? gip qu v? ki?m sot ???ng huy?t v gi?m nguy c? m?c b?nh tim. Chuyn gia ch?m sc s?c kh?e c?a qu v? c th? khuyn qu v? trao ??i v?i chuyn gia dinh d??ng ?? xy d?ng ch??ng trnh ?n u?ng t?t nh?t cho qu  v?. Thng tin ny khng nh?m m?c ?ch thay th? cho l?i khuyn m chuyn gia ch?m sc s?c kh?e ni v?i qu v?. Hy b?o ??m qu v? ph?i th?o lu?n b?t k? v?n ?? g m qu v? c v?i chuyn gia ch?m sc s?c kh?e c?a qu v?. Document Revised: 11/16/2020 Document Reviewed: 11/16/2020 [employer] Patient Education  2023 [employer]  

## 2022-06-04 NOTE — Assessment & Plan Note (Signed)
Stable.  Diet and nutrition discussed. Continue atorvastatin 40 mg daily. 

## 2022-06-04 NOTE — Assessment & Plan Note (Signed)
Well-controlled hypertension. Continue lisinopril 20 mg daily. Uncontrolled diabetes with hemoglobin A1c at 10.9. Continue Farxiga 10 mg daily Start glipizide 5 mg twice a day with food Hypoglycemia precautions given Cardiovascular risks associated with hypertension and uncontrolled diabetes discussed. Diet and nutrition discussed. Follow-up in 3 months.

## 2022-06-04 NOTE — Progress Notes (Signed)
Cathy Robbins 66 y.o.   Chief Complaint  Patient presents with   Follow-up    f/u appt, no concerns     HISTORY OF PRESENT ILLNESS: This is a 66 y.o. female here for 33-month follow-up of hypertension and diabetes. Not taking Rybelsus.  Never started it.  Only taking Farxiga 10 mg daily for diabetes Also taking atorvastatin and lisinopril Has occasional morning headaches Blood sugar this morning 350 Lab Results  Component Value Date   HGBA1C 13.4 (A) 01/03/2022   BP Readings from Last 3 Encounters:  06/04/22 136/80  02/26/22 136/72  02/22/22 (!) 145/70     HPI   Prior to Admission medications   Medication Sig Start Date End Date Taking? Authorizing Provider  atorvastatin (LIPITOR) 40 MG tablet Take 1 tablet (40 mg total) by mouth daily at 6 PM. 01/29/22   Tahra Hitzeman, Eilleen Kempf, MD  Blood Glucose Calibration (CONTOUR NEXT CONTROL) Normal SOLN 1 drop by In Vitro route as needed. 12/19/17   McVey, Madelaine Bhat, PA-C  dapagliflozin propanediol (FARXIGA) 10 MG TABS tablet Take 1 tablet (10 mg total) by mouth daily before breakfast. 02/26/22 02/21/23  Georgina Quint, MD  gabapentin (NEURONTIN) 100 MG capsule Take 1 capsule (100 mg total) by mouth 3 (three) times daily. 04/18/22   Georgina Quint, MD  lisinopril (ZESTRIL) 20 MG tablet Take 1 tablet (20 mg total) by mouth daily. 01/29/22   Georgina Quint, MD  Semaglutide (RYBELSUS) 7 MG TABS Take 7 mg by mouth daily. 02/26/22   Georgina Quint, MD    No Known Allergies  Patient Active Problem List   Diagnosis Date Noted   Uncontrolled type 2 diabetes mellitus with hyperglycemia (HCC) 01/03/2022   Hypertension associated with diabetes (HCC) 01/03/2022   Chronic pain of both hips 07/14/2019   Positive ANA (antinuclear antibody) 07/14/2019   Dyslipidemia associated with type 2 diabetes mellitus (HCC) 11/20/2015   Hyperlipidemia 11/20/2015    Past Medical History:  Diagnosis Date   Diabetes mellitus  without complication (HCC)     No past surgical history on file.  Social History   Socioeconomic History   Marital status: Married    Spouse name: Not on file   Number of children: 4   Years of education: Not on file   Highest education level: Not on file  Occupational History   Not on file  Tobacco Use   Smoking status: Never   Smokeless tobacco: Never  Vaping Use   Vaping Use: Never used  Substance and Sexual Activity   Alcohol use: No   Drug use: No   Sexual activity: Not Currently  Other Topics Concern   Not on file  Social History Narrative   Not on file   Social Determinants of Health   Financial Resource Strain: Low Risk  (01/12/2019)   Overall Financial Resource Strain (CARDIA)    Difficulty of Paying Living Expenses: Not hard at all  Food Insecurity: No Food Insecurity (01/12/2019)   Hunger Vital Sign    Worried About Running Out of Food in the Last Year: Never true    Ran Out of Food in the Last Year: Never true  Transportation Needs: No Transportation Needs (01/12/2019)   PRAPARE - Administrator, Civil Service (Medical): No    Lack of Transportation (Non-Medical): No  Physical Activity: Sufficiently Active (01/12/2019)   Exercise Vital Sign    Days of Exercise per Week: 5 days    Minutes of Exercise  per Session: 30 min  Stress: No Stress Concern Present (01/12/2019)   [employer] of Occupational Health - Occupational Stress Questionnaire    Feeling of Stress : Not at all  Social Connections: Moderately Isolated (01/12/2019)   Social Connection and Isolation Panel [NHANES]    Frequency of Communication with Friends and Family: Twice a week    Frequency of Social Gatherings with Friends and Family: Once a week    Attends Religious Services: Never    Database administrator or Organizations: No    Attends Banker Meetings: Never    Marital Status: Married  Catering manager Violence: Not At Risk (01/12/2019)   Humiliation,  Afraid, Rape, and Kick questionnaire    Fear of Current or Ex-Partner: No    Emotionally Abused: No    Physically Abused: No    Sexually Abused: No    No family history on file.   Review of Systems  Constitutional: Negative.  Negative for chills and fever.  HENT: Negative.  Negative for congestion and sore throat.   Respiratory: Negative.  Negative for cough and shortness of breath.   Cardiovascular: Negative.  Negative for chest pain and palpitations.  Gastrointestinal:  Negative for abdominal pain, diarrhea, nausea and vomiting.  Genitourinary: Negative.   Skin: Negative.  Negative for rash.  Neurological:  Positive for headaches.  All other systems reviewed and are negative.   Today's Vitals   06/04/22 0932  BP: 136/80  Pulse: 70  Temp: 98.4 F (36.9 C)  TempSrc: Oral  SpO2: 93%  Weight: 101 lb 4 oz (45.9 kg)  Height: 5\' 2"  (1.575 m)   Body mass index is 18.52 kg/m. Wt Readings from Last 3 Encounters:  06/04/22 101 lb 4 oz (45.9 kg)  02/26/22 98 lb 8 oz (44.7 kg)  01/29/22 100 lb 2 oz (45.4 kg)    Physical Exam Vitals reviewed.  Constitutional:      Appearance: Normal appearance.  HENT:     Head: Normocephalic.     Mouth/Throat:     Mouth: Mucous membranes are moist.     Pharynx: Oropharynx is clear.  Eyes:     Extraocular Movements: Extraocular movements intact.     Pupils: Pupils are equal, round, and reactive to light.  Cardiovascular:     Rate and Rhythm: Normal rate and regular rhythm.     Pulses: Normal pulses.     Heart sounds: Normal heart sounds.  Pulmonary:     Effort: Pulmonary effort is normal.     Breath sounds: Normal breath sounds.  Musculoskeletal:     Cervical back: No tenderness.  Lymphadenopathy:     Cervical: No cervical adenopathy.  Skin:    General: Skin is warm and dry.  Neurological:     General: No focal deficit present.     Mental Status: She is alert and oriented to person, place, and time.  Psychiatric:        Mood  and Affect: Mood normal.        Behavior: Behavior normal.    Results for orders placed or performed in visit on 06/04/22 (from the past 24 hour(s))  POCT HgB A1C     Status: Abnormal   Collection Time: 06/04/22 10:00 AM  Result Value Ref Range   Hemoglobin A1C 10.9 (A) 4.0 - 5.6 %   HbA1c POC (<> result, manual entry)     HbA1c, POC (prediabetic range)     HbA1c, POC (controlled diabetic range)  ASSESSMENT & PLAN: A total of 43 minutes was spent with the patient and counseling/coordination of care regarding preparing for this visit, review of most recent office visit notes, review of most recent blood work results including interpretation of today's hemoglobin A1c, review of multiple chronic medical conditions and their management, review of all medications and changes made, education on nutrition, cardiovascular risks associated with uncontrolled diabetes, prognosis, documentation and need for follow-up in 3 months.  Problem List Items Addressed This Visit       Cardiovascular and Mediastinum   Hypertension associated with diabetes (HCC) - Primary    Well-controlled hypertension. Continue lisinopril 20 mg daily. Uncontrolled diabetes with hemoglobin A1c at 10.9. Continue Farxiga 10 mg daily Start glipizide 5 mg twice a day with food Hypoglycemia precautions given Cardiovascular risks associated with hypertension and uncontrolled diabetes discussed. Diet and nutrition discussed. Follow-up in 3 months.      Relevant Medications   glipiZIDE (GLUCOTROL) 5 MG tablet     Endocrine   Dyslipidemia associated with type 2 diabetes mellitus (HCC)    Stable.  Diet and nutrition discussed. Continue atorvastatin 40 mg daily.      Relevant Medications   glipiZIDE (GLUCOTROL) 5 MG tablet   Uncontrolled type 2 diabetes mellitus with hyperglycemia (HCC)   Relevant Medications   glipiZIDE (GLUCOTROL) 5 MG tablet   Other Relevant Orders   POCT HgB A1C (Completed)      Genitourinary   Stage 3a chronic kidney disease (HCC)    Advised to stay well-hydrated and avoid NSAIDs Continue Farxiga 10 mg daily.      Other Visit Diagnoses     Encounter for screening mammogram for malignant neoplasm of breast       Relevant Orders   MM Digital Screening      Patient Instructions  B?nh ti?u ???ng v dinh d??ng, ng??i l?n Diabetes Mellitus and Nutrition, Adult Khi qu v? b? ti?u ???ng hay b?nh ti?u ???ng, ?i?u r?t quan tr?ng l ph?i c thi quen ?n u?ng t?t cho s?c kh?e b?i v nh?ng g qu v? ?n v u?ng ?nh h??ng l?n ??n n?ng ?? ???ng huy?t (glucose) c?a qu v?. ?n th?c ph?m t?t cho s?c kh?e v?i s? l??ng v?a ph?i, vo cng cc th?i ?i?m m?i ngy, c th? gip qu v?: Qu?n l ???ng huy?t. Gi?m nguy c? b? b?nh tim. C?i thi?n huy?t p. ??t ???c ho?c duy tr m??c cn n?ng c l?i cho s?c kh?e. ?i?u g c th? ?nh h??ng ??n k? ho?ch ?n u?ng c?a ti? M?i ng??i b? ti?u ???ng khc nhau v m?i ng??i ??u c nhu c?u v? ch??ng trnh ?n u?ng khc nhau. Chuyn gia ch?m [place] s?c kh?e c?a qu v? c th? [place] qu v? trao ??i v?i chuyn gia dinh d??ng ?? xy d?ng ch??ng trnh ?n u?ng t?t nh?t cho qu v?. Ch??ng trnh ?n u?ng c?a qu v? c th? thay ??i ty thu?c vo cc y?u t? nh?: L??ng calo quy? vi? c?n. Cc lo?i thu?c m quy? vi? du?ng. Cn n??ng cu?a quy? vi?. N?ng ?? ???ng huy?t, huy?t p v cholesterol c?a qu v?. M?c ?? ho?t ??ng c?a qu v?. Cc tnh tr?ng s?c kh?e khc m qu v? c, ch?ng h?n nh? b?nh tim ho?c b?nh th?n. Carbohydrate ?nh h??ng nh? th? no ??n ti? Carbohydrate, hay cn g?i l carb, ?nh h??ng ??n l??ng ???ng huy?t c?a quy? vi? nhi?u h?n b?t k? lo?i th?c ph?m no khc. ?n carb lm t?ng l??ng glucose trong mu.  Bi?t l??ng carb m qu v? c th? ?n m?t cch an ton trong m?i b?a ?n c vai tr quan tr?ng. L??ng carbohydrate ny khc nhau v?i m?i ng??i. Chuyn gia dinh d??ng c?a qu v? c th? gip tnh ton l??ng carb m qu v? c?n ph?i ?n vo m?i b?a ?n chnh v m?i  b?a ?n nh?. R??u ?nh h??ng ??n ti nh? th? no? R??u c th? gy gi?m ???ng huy?t (h? ???ng huy?t), ??c bi?t l n?u qu v? s? d?ng insulin ho?c u?ng m?t s? lo?i thu?c nh?t ??nh ?i?u tr? ti?u ???ng. H? ???ng huy?t c th? l m?t tnh tr?ng ?e d?a tnh [place]?ng. Cc tri?u ch?ng c?a h? ???ng huy?t, ch?ng h?n bu?n ng?, chng m?t v l l?n, t??ng t? cc tri?u ch?ng c?a vi?c u?ng qu nhi?u r??u. Khng u?ng r??u n?u: Chuyn gia ch?m [place] s?c kh?e khuyn qu v? khng u?ng r??u. Qu v? c [place], c th? c [place], ho?c c k? ho?ch c [place]. N?u qu v? u?ng r??u: Gi?i h?n l??ng r??u qu v? u?ng ? m?c: 0-1 ly/ngy ??i v?i n? gi?i. 0-2 ly/ngy ??i v?i nam gi?i. Bi?t m?t ly c bao nhiu r??u. ? M?, m?t ly t??ng ???ng v?i m?t chai bia 12 ao-x? (355 mL), m?t ly r??u vang 5 ao-x? (148 mL), ho?c m?t ly r??u m?nh 1 ao-x? (44 mL). Lun b ?? n??c b?ng n??c, soda dnh cho ng??i ?n king, ho?c tr ? khng ???ng. Tamera Punt nh? r?ng soda thng th???ng, n??c tri cy v ca?c ?? u?ng h?n h??p khc c th? ch?a nhi?u ???ng v ph?i ???c tnh l carb. C nh?ng l?i khuyn no ?? tun th? k? ho?ch ny?  ??c nhn th?c ph?m B?t ??u b?ng vi?c ki?m tra kch c? kh?u ph?n trn nhn Thng tin dinh d??ng c?a th?c ph?m v ?? u?ng ?ng gi. S? calo v l??ng carb, ch?t bo v cc ch?t dinh d??ng khc ghi trn nhn d?a trn m?t kh?u ph?n c?a mn ?. Nhi?u mn ch?a nhi?u h?n m?t kh?u ph?n trn m?i bao b. Ki?m tra t?ng s? gram (g) carb trong m?t kh?u ph?n. Ki?m tra s? gram ch?t bo bo ha v ch?t bo chuy?n ha trong m?t kh?u ph?n. Ch?n th?c ph?m c t ho?c khng c nh?ng ch?t bo ny. Ki?m tra s? miligam (mg) mu?i (natri) trong m?t kh?u ph?n. H?u h?t m?i ng??i ??u nn gi?i h?n t?ng natri tiu thu? ?? m??c d??i 2.300 mg m?i ngy. Lun ki?m tra thng tin dinh d??ng c?a th?c ph?m ghi trn nhn l "t bo" hay "khng bo". Nh?ng th?c ph?m ny c th? c b? sung ???ng ho?c carb tinh luy?n cao h?n v c?n ph?i trnh. Hy trao ??i v?i chuyn gia dinh d??ng  c?a qu v? ?? xc ??nh m?c tiu hng ngy c?a qu v? ??i v?i cc ch?t dinh d??ng li?t k trn nhn. Mua s?m Trnh mua nh?ng th?c ph?m ?ng h?p, lm s?n ho?c ch? bi?n s?n. Nh?ng th?c ph?m ny c xu h??ng c nhi?u ch?t bo, natri, b? sung ???ng. Mua s?m quanh ra ngoi c?a c?a hng t?p ha. ?y l n?i qu v? s? d? tm th?y tri cy v rau c? t??i, nhi?u lo?i h?t, th?t t??i v s?n ph?m s?a t??i. N?u n??ng S? d?ng ca?c ph??ng php n?u ?n nhi?t ?? th?p, ch?ng h?n nh? n??ng, thay v ph??ng php n?u nhi?t ?? cao nh? chin ng?p d?u. N?u ?n b?ng cch s? d?ng cc lo?i d?u t?t cho s?c  kh?e, ch?ng h?n nh? d?u  liu, d?u h?t c?i d?u ho?c d?u h??ng d??ng. [place] n?u v?i b?, kem, ho?c th?t nhi?u m?. Ln k? ho?ch cho b?a ?n ?n cc b?a ?n chnh v cc b?a ?n nh? ??u ??n, t?t nh?t l vo cng m?t th?i ?i?m m?i ngy. Trnh nhi?n ?n trong th?i [employer]. ?n th?c ?n giu ch?t x? nh? cc lo?i tri cy t??i, rau c?, ??u v ng? c?c nguyn cm. ?n 4-6 ao-x? (oz) (112-168 g) protein n?c m?i ngy, ch?ng h?n nh? th?t n?c, th?t g, c, tr?ng, ho?c ??u ph?. M?t ao-x? (oz) (28 g) protein n?c b?ng: 1 ao-x? (oz) (28 g) th?t, th?t g ho?c c. 1 qu? tr?ng. 1?4 c?c (62 g) ??u ph?. ?n m?t s? th?c ph?m ch?a cc ch?t bo lnh m?nh m?i ngy, ch?ng h?n nh? qu? b?, qu? h?ch, cc lo?i h?t v c. Ti nn ?n nh?ng th?c ph?m no? Tri cy Qu? m?ng. To. [place]? ?o. Qu? m?. Qu? m?n. Nho. Xoi. Qu? ?u ??[place]? l?u. Qu? kiwi. Qu? anh ?o. Derrek Gu c? Ferne Coe, bao g?m c?i xo?n, rau bina, kale, c?i c?u v?ng, c?i r? c?i b? xanh v c?i b?p. C? c?i ???ng. Sp l?. Bng c?i xanh. C r?t. ??u xanh. C chua. ?t ng?t. Hnh ty. D?a chu?t. C?i bruxen. Ng? c?c Ng? c?c nguyn ca?m nh? bnh m nguyn ca?m ho??c nguyn h?t, bnh quy gin, bnh m ng, ng? c?c v m ?ng. B?t y?n m?ch khng ???ng. H?t dim m?ch (quinoa). G?o l?c ho?c g?o khng xt. Th?t v cc protein khc H?i s?n. Th?t gia c?m khng da. Mi?ng n?c th?t gia c?m v th?t b. ??u ph?. Qu? h?ch.  Cc lo?i h?t. S?a Cc s?n ph?m s??a khng co? ho??c co? i?t ch?t be?o, ch?ng h?n nh? s?a, s??a chua va? pho mt. Nh?ng th?c ph?m li?t k ? trn c th? khng ph?i l m?t danh m?c ??y ?? cc th?c ph?m v ?? u?ng m qu v? c th? ?n v u?ng. Lin h? v?i chuyn gia dinh d??ng ?? c thm thng tin. Ti nn trnh nh?ng th?c ph?m no? Tri cy Tri cy ?ng h?p xi-r. Derrek Gu c? Derrek Gu c? ?ng h?p. Rau ?ng l?nh v?i b? ho?c n??c s?t kem. Ng? c?c Cc s?n ph?m lm t? b?t m tr?ng v b?t m ???c tinh ch? nh? bnh m, m ?ng, ?? ?n nh? v ng? c?c. Trnh t?t c? cc th?c ph?m ch? bi?n s?n. Th?t v cc protein khc Cc la?t thi?t m??. Th?t gia c?m c da. Th?t chin/rn ho?c t?m b?t rn. Th?t ch? bi?n s?n. Trnh cc ch?t bo bo ha. S?a S?a chua nguyn ch?t bo, pho mt ho?c s?a. ?? u?ng ?? u?ng c ???ng, ch?ng h?n nh? soda ho?c tr ?Marland Kitchen Nh?ng th?c ph?m li?t k ? trn c th? khng ph?i l m?t danh m?c ??y ?? cc lo?i th?c ph?m v ?? u?ng m qu v? nn trnh. Lin h? v?i chuyn gia dinh d??ng ?? c thm thng tin. Ca?c cu h?i c?n ??a ra v?i chuyn gia ch?m [place] s?c kh?e Ti c c?n g?p chuyn gia gio d?c v ch?m [place] b?nh ti?u ???ng c ch?ng nh?n khng? Ti c c?n g?p chuyn gia dinh d??ng khng? Ti co? th? go?i cho s? na?o n?u ti co? th?c m?c? Khi no l th?i ?i?m ki?m tra ???ng huy?t t?t nh?t? N?i ?? tm thm thng tin: American Diabetes Association (Hi?p h?i Ti?u ????ng [place]?): diabetes.org Academy of  Nutrition and Dietetics (H?c vi?n Dinh d??ng v ?n u?ng): eatright.[employer]org National Institute of Diabetes and Digestive and Kidney Diseases (Vi?n Ti?u ???ng v cc B?nh Tiu ha v B?nh Th?n Qu?c gia): [email or web address].siniddk.nih.gov Association of Diabetes Care & Education Specialists (Hi?p h?i Chuyn gia Gio d?c v Ch?m [place] B?nh ti?u ???ng): diabeteseducator.org Tm t?t ?i?u r?t quan tr?ng l ph?i c thi quen ?n u?ng t?t cho s?c kh?e b?i v nh?ng g qu v? ?n v u?ng ?nh h??ng l?n ??n n?ng ?? ???ng huy?t (glucose) c?a  qu v?. ?i?u quan tr?ng l s? d?ng m?t r??u cch th?n tr?ng. M?t k? ho?ch ?n u?ng t?t cho s?c kh?e s? gip qu v? ki?m sot ???ng huy?t v gi?m nguy c? m?c b?nh tim. Chuyn gia ch?m [place] s?c kh?e c?a qu v? c th? [place]khuyn qu v? trao ??i v?i chuyn gia dinh d??ng ?? xy d?ng ch??ng trnh ?n u?ng t?t nh?t cho qu v?. Thng tin ny khng nh?m m?c ?ch thay th? cho l?i khuyn m chuyn gia ch?m [place] s?c kh?e ni v?i qu v?. Hy b?o ??m qu v? ph?i th?o lu?n b?t k? v?n ?? g m qu v? c v?i chuyn gia ch?m  s?c kh?e c?a qu v?. Document Revised: 11/16/2020 Document Reviewed: 11/16/2020 Elsevier Patient Education  2023 Elsevier Inc.    Edwina BarthMiguel Habeeb Puertas, MD [facility]

## 2022-06-04 NOTE — Assessment & Plan Note (Signed)
Advised to stay well-hydrated and avoid NSAIDs. Continue Farxiga 10 mg daily. 

## 2022-09-03 ENCOUNTER — Encounter: Payer: Self-pay | Admitting: Emergency Medicine

## 2022-09-03 ENCOUNTER — Telehealth: Payer: Self-pay | Admitting: Emergency Medicine

## 2022-09-03 ENCOUNTER — Ambulatory Visit (INDEPENDENT_AMBULATORY_CARE_PROVIDER_SITE_OTHER): Payer: Medicare Other | Admitting: Emergency Medicine

## 2022-09-03 VITALS — BP 122/68 | HR 66 | Temp 98.1°F | Ht 62.0 in | Wt 106.5 lb

## 2022-09-03 DIAGNOSIS — G8929 Other chronic pain: Secondary | ICD-10-CM

## 2022-09-03 DIAGNOSIS — M79605 Pain in left leg: Secondary | ICD-10-CM

## 2022-09-03 DIAGNOSIS — N1831 Chronic kidney disease, stage 3a: Secondary | ICD-10-CM | POA: Diagnosis not present

## 2022-09-03 DIAGNOSIS — E1169 Type 2 diabetes mellitus with other specified complication: Secondary | ICD-10-CM | POA: Diagnosis not present

## 2022-09-03 DIAGNOSIS — E785 Hyperlipidemia, unspecified: Secondary | ICD-10-CM | POA: Diagnosis not present

## 2022-09-03 DIAGNOSIS — I152 Hypertension secondary to endocrine disorders: Secondary | ICD-10-CM | POA: Diagnosis not present

## 2022-09-03 DIAGNOSIS — E1159 Type 2 diabetes mellitus with other circulatory complications: Secondary | ICD-10-CM

## 2022-09-03 LAB — POCT GLYCOSYLATED HEMOGLOBIN (HGB A1C): Hemoglobin A1C: 8 % — AB (ref 4.0–5.6)

## 2022-09-03 NOTE — Progress Notes (Signed)
Cathy Robbins 67 y.o.   Chief Complaint  Patient presents with   Follow-up    43mth f/u appt , patient states she is having left leg pain     HISTORY OF PRESENT ILLNESS: This is a 67y.o. female here for 345-monthollow-up of diabetes, hypertension and dyslipidemia Overall doing well. Has chronic pain to left calf area since 2017 No other complaints or medical concerns today. Lab Results  Component Value Date   HGBA1C 10.9 (A) 06/04/2022   BP Readings from Last 3 Encounters:  09/03/22 122/68  06/04/22 136/80  02/26/22 136/72   Wt Readings from Last 3 Encounters:  09/03/22 106 lb 8 oz (48.3 kg)  06/04/22 101 lb 4 oz (45.9 kg)  02/26/22 98 lb 8 oz (44.7 kg)     HPI   Prior to Admission medications   Medication Sig Start Date End Date Taking? Authorizing Provider  atorvastatin (LIPITOR) 40 MG tablet Take 1 tablet (40 mg total) by mouth daily at 6 PM. 01/29/22  Yes Nyela Cortinas, MiInes BloomerMD  Blood Glucose Calibration (CONTOUR NEXT CONTROL) Normal SOLN 1 drop by In Vitro route as needed. 12/19/17  Yes McVey, ElGelene MinkPA-C  dapagliflozin propanediol (FARXIGA) 10 MG TABS tablet Take 1 tablet (10 mg total) by mouth daily before breakfast. 02/26/22 02/21/23 Yes Leonidas Boateng, MiInes BloomerMD  glipiZIDE (GLUCOTROL) 5 MG tablet Take 1 tablet (5 mg total) by mouth 2 (two) times daily before a meal. 06/04/22 05/30/23 Yes Krystena Reitter, MiInes BloomerMD  lisinopril (ZESTRIL) 20 MG tablet Take 1 tablet (20 mg total) by mouth daily. 01/29/22  Yes Owens Hara, MiInes BloomerMD  gabapentin (NEURONTIN) 100 MG capsule Take 1 capsule (100 mg total) by mouth 3 (three) times daily. Patient not taking: Reported on 09/03/2022 04/18/22   SaHorald PollenMD    No Known Allergies  Patient Active Problem List   Diagnosis Date Noted   Stage 3a chronic kidney disease ([facility]12/10/2021   Uncontrolled type 2 diabetes mellitus with hyperglycemia ([facility]07/11/2021   Hypertension associated with diabetes ([facility] 01/03/2022   Chronic pain of both hips 07/14/2019   Positive ANA (antinuclear antibody) 07/14/2019   Dyslipidemia associated with type 2 diabetes mellitus ([facility]05/22/2017   Hyperlipidemia 11/20/2015    Past Medical History:  Diagnosis Date   Diabetes mellitus without complication ([facility]    No past surgical history on file.  Social History   Socioeconomic History   Marital status: Married    Spouse name: Not on file   Number of children: 4   Years of education: Not on file   Highest education level: Not on file  Occupational History   Not on file  Tobacco Use   Smoking status: Never   Smokeless tobacco: Never  Vaping Use   Vaping Use: Never used  Substance and Sexual Activity   Alcohol use: No   Drug use: No   Sexual activity: Not Currently  Other Topics Concern   Not on file  Social History Narrative   Not on file   Social Determinants of Health   Financial Resource Strain: Low Risk  (01/12/2019)   Overall Financial Resource Strain (CARDIA)    Difficulty of Paying Living Expenses: Not hard at all  Food Insecurity: No Food Insecurity (01/12/2019)   Hunger Vital Sign    Worried About Running Out of Food in the Last Year: Never true    [place] the Last Year: Never true  Transportation Needs:  No Transportation Needs (01/12/2019)   PRAPARE - Hydrologist (Medical): No    Lack of Transportation (Non-Medical): No  Physical Activity: Sufficiently Active (01/12/2019)   Exercise Vital Sign    Days of Exercise per Week: 5 days    Minutes of Exercise per Session: 30 min  Stress: No Stress Concern Present (01/12/2019)   [place]    Feeling of Stress : Not at all  Social Connections: Moderately Isolated (01/12/2019)   Social Connection and Isolation Panel [NHANES]    Frequency of Communication with Friends and Family: Twice a week    Frequency of Social Gatherings with  Friends and Family: Once a week    Attends Religious Services: Never    Marine scientist or Organizations: No    Attends Archivist Meetings: Never    Marital Status: Married  Human resources officer Violence: Not At Risk (01/12/2019)   Humiliation, Afraid, Rape, and Kick questionnaire    Fear of Current or Ex-Partner: No    Emotionally Abused: No    Physically Abused: No    Sexually Abused: No    No family history on file.   Review of Systems  Constitutional: Negative.  Negative for chills and fever.  HENT: Negative.  Negative for congestion and sore throat.   Respiratory: Negative.  Negative for cough and shortness of breath.   Cardiovascular: Negative.  Negative for chest pain and palpitations.  Gastrointestinal:  Negative for abdominal pain, diarrhea, nausea and vomiting.  Genitourinary: Negative.  Negative for dysuria and hematuria.  Skin: Negative.  Negative for rash.  Neurological: Negative.  Negative for dizziness and headaches.  All other systems reviewed and are negative.  Today's Vitals   09/03/22 0931  BP: 122/68  Pulse: 66  Temp: 98.1 F (36.7 C)  TempSrc: Oral  SpO2: 96%  Weight: 106 lb 8 oz (48.3 kg)  Height: '5\' 2"'$  (1.575 m)   Body mass index is 19.48 kg/m.   Physical Exam Vitals reviewed.  Constitutional:      Appearance: Normal appearance.  HENT:     Head: Normocephalic.     Mouth/Throat:     Mouth: Mucous membranes are moist.     Pharynx: Oropharynx is clear.  Eyes:     Extraocular Movements: Extraocular movements intact.     Conjunctiva/sclera: Conjunctivae normal.     Pupils: Pupils are equal, round, and reactive to light.  Cardiovascular:     Rate and Rhythm: Normal rate and regular rhythm.     Pulses: Normal pulses.     Heart sounds: Normal heart sounds.  Pulmonary:     Effort: Pulmonary effort is normal.     Breath sounds: Normal breath sounds.  Abdominal:     Palpations: Abdomen is soft.     Tenderness: There is no  abdominal tenderness.  Musculoskeletal:     Cervical back: No tenderness.     Comments: Left lower extremity: Neurovascularly intact.  Good peripheral circulation.  No erythema or swelling.  No localized tenderness.  No calf swelling.  No muscle tenderness.  Unremarkable physical findings.  Lymphadenopathy:     Cervical: No cervical adenopathy.  Skin:    General: Skin is warm and dry.     Capillary Refill: Capillary refill takes less than 2 seconds.  Neurological:     General: No focal deficit present.     Mental Status: She is alert and oriented to person, place, and  time.  Psychiatric:        Mood and Affect: Mood normal.        Behavior: Behavior normal.    Results for orders placed or performed in visit on 09/03/22 (from the past 24 hour(s))  POCT HgB A1C     Status: Abnormal   Collection Time: 09/03/22  9:59 AM  Result Value Ref Range   Hemoglobin A1C 8.0 (A) 4.0 - 5.6 %   HbA1c POC (<> result, manual entry)     HbA1c, POC (prediabetic range)     HbA1c, POC (controlled diabetic range)       ASSESSMENT & PLAN: A total of 48 minutes was spent with the patient and counseling/coordination of care regarding preparing for this visit, review of most recent office visit notes, review of multiple chronic medical conditions under management, review of most recent blood work results including interpretation of today's hemoglobin A1c, cardiovascular risks associated with uncontrolled diabetes, review of all medications, education on nutrition, review of health maintenance items, prognosis, documentation, and need for follow-up.  Problem List Items Addressed This Visit       Cardiovascular and Mediastinum   Hypertension associated with diabetes ([facility]) - Primary    Well-controlled hypertension.  Continue lisinopril 20 mg daily. Better controlled diabetes with hemoglobin A1c less than before at 8.0. Recommend to continue glipizide 5 mg twice a day and Farxiga 10 mg daily Cardiovascular  risks associated with hypertension and diabetes discussed Diet and nutrition discussed. Follow-up in 3 months.      Relevant Orders   POCT HgB A1C (Completed)     Endocrine   Dyslipidemia associated with type 2 diabetes mellitus ([place])    Diet and nutrition discussed. Continue atorvastatin 40 mg daily. The 10-year ASCVD risk score (Arnett DK, et al., 2019) is: 17.3%   Values used to calculate the score:     Age: 29 years     Sex: Female     Is Non-Hispanic African American: No     Diabetic: Yes     Tobacco smoker: No     Systolic Blood Pressure: 123XX123 mmHg     Is BP treated: Yes     HDL Cholesterol: 44.9 mg/dL     Total Cholesterol: 173 mg/dL         Genitourinary   Stage 3a chronic kidney disease (HCC)    Advised to stay well-hydrated and avoid NSAIDs Continue Farxiga 10 mg daily to help with diabetes and prevent further progression of chronic kidney disease.        Other   Chronic pain of left lower extremity    No significant physical findings.  Chronic calf pain since 2017 Pain management discussed.  No concerns.      Patient Instructions  Continue present medications. Follow-up in 3 months.  B?nh ti?u ???ng v dinh d??ng, ng??i l?n Diabetes Mellitus and Nutrition, Adult Khi qu v? b? ti?u ???ng hay b?nh ti?u ???ng, ?i?u r?t quan tr?ng l ph?i c thi quen ?n u?ng t?t cho s?c kh?e b?i v nh?ng g qu v? ?n v u?ng ?nh h??ng l?n ??n n?ng ?? ???ng huy?t (glucose) c?a qu v?. ?n th?c ph?m t?t cho s?c kh?e v?i s? l??ng v?a ph?i, vo cng cc th?i ?i?m m?i ngy, c th? gip qu v?: Qu?n l ???ng huy?t. Gi?m nguy c? b? b?nh tim. C?i thi?n huy?t p. ??t ???c ho?c duy tr m??c cn n?ng c l?i cho s?c kh?e. ?i?u g c th? ?nh h??ng ??n k?  ho?ch ?n u?ng c?a ti? M?i ng??i b? ti?u ???ng khc nhau v m?i ng??i ??u c nhu c?u v? ch??ng trnh ?n u?ng khc nhau. Chuyn gia ch?m [place] s?c kh?e c?a qu v? c th? [place] qu v? trao ??i v?i chuyn gia dinh d??ng ?? xy d?ng ch??ng  trnh ?n u?ng t?t nh?t cho qu v?. Ch??ng trnh ?n u?ng c?a qu v? c th? thay ??i ty thu?c vo cc y?u t? nh?: L??ng calo quy? vi? c?n. Cc lo?i thu?c m quy? vi? du?ng. Cn n??ng cu?a quy? vi?. N?ng ?? ???ng huy?t, huy?t p v cholesterol c?a qu v?. M?c ?? ho?t ??ng c?a qu v?. Cc tnh tr?ng s?c kh?e khc m qu v? c, ch?ng h?n nh? b?nh tim ho?c b?nh th?n. Carbohydrate ?nh h??ng nh? th? no ??n ti? Carbohydrate, hay cn g?i l carb, ?nh h??ng ??n l??ng ???ng huy?t c?a quy? vi? nhi?u h?n b?t k? lo?i th?c ph?m no khc. ?n carb lm t?ng l??ng glucose trong mu. Bi?t l??ng carb m qu v? c th? ?n m?t cch an ton trong m?i b?a ?n c vai tr quan tr?ng. L??ng carbohydrate ny khc nhau v?i m?i ng??i. Chuyn gia dinh d??ng c?a qu v? c th? gip tnh ton l??ng carb m qu v? c?n ph?i ?n vo m?i b?a ?n chnh v m?i b?a ?n nh?. R??u ?nh h??ng ??n ti nh? th? no? R??u c th? gy gi?m ???ng huy?t (h? ???ng huy?t), ??c bi?t l n?u qu v? s? d?ng insulin ho?c u?ng m?t s? lo?i thu?c nh?t ??nh ?i?u tr? ti?u ???ng. H? ???ng huy?t c th? l m?t tnh tr?ng ?e d?a tnh [place]?ng. Cc tri?u ch?ng c?a h? ???ng huy?t, ch?ng h?n bu?n ng?, chng m?t v l l?n, t??ng t? cc tri?u ch?ng c?a vi?c u?ng qu nhi?u r??u. Khng u?ng r??u n?u: Chuyn gia ch?m [place] s?c kh?e khuyn qu v? khng u?ng r??u. Qu v? c [place], c th? c [place], ho?c c k? ho?ch c [place]. N?u qu v? u?ng r??u: Gi?i h?n l??ng r??u qu v? u?ng ? m?c: 0-1 ly/ngy ??i v?i n? gi?i. 0-2 ly/ngy ??i v?i nam gi?i. Bi?t m?t ly c bao nhiu r??u. ? M?, m?t ly t??ng ???ng v?i m?t chai bia 12 ao-x? (355 mL), m?t ly r??u vang 5 ao-x? (148 mL), ho?c m?t ly r??u m?nh 1 ao-x? (44 mL). Lun b ?? n??c b?ng n??c, soda dnh cho ng??i ?n king, ho?c tr ? khng ???ng. Orlene Plum nh? r?ng soda thng th???ng, n??c tri cy v ca?c ?? u?ng h?n h??p khc c th? ch?a nhi?u ???ng v ph?i ???c tnh l carb. C nh?ng l?i khuyn no ?? tun th? k? ho?ch ny?  ??c nhn th?c  ph?m B?t ??u b?ng vi?c ki?m tra kch c? kh?u ph?n trn nhn Thng tin dinh d??ng c?a th?c ph?m v ?? u?ng ?ng gi. S? calo v l??ng carb, ch?t bo v cc ch?t dinh d??ng khc ghi trn nhn d?a trn m?t kh?u ph?n c?a mn ?. Nhi?u mn ch?a nhi?u h?n m?t kh?u ph?n trn m?i bao b. Ki?m tra t?ng s? gram (g) carb trong m?t kh?u ph?n. Ki?m tra s? gram ch?t bo bo ha v ch?t bo chuy?n ha trong m?t kh?u ph?n. Ch?n th?c ph?m c t ho?c khng c nh?ng ch?t bo ny. Ki?m tra s? miligam (mg) mu?i (natri) trong m?t kh?u ph?n. H?u h?t m?i ng??i ??u nn gi?i h?n t?ng natri tiu thu? ?? m??c d??i 2.300 mg m?i ngy. Lun ki?m tra thng tin  dinh d??ng c?a th?c ph?m ghi trn nhn l "t bo" hay "khng bo". Nh?ng th?c ph?m ny c th? c b? sung ???ng ho?c carb tinh luy?n cao h?n v c?n ph?i trnh. Hy trao ??i v?i chuyn gia dinh d??ng c?a qu v? ?? xc ??nh m?c tiu hng ngy c?a qu v? ??i v?i cc ch?t dinh d??ng li?t k trn nhn. Mua s?m Trnh mua nh?ng th?c ph?m ?ng h?p, lm s?n ho?c ch? bi?n s?n. Nh?ng th?c ph?m ny c xu h??ng c nhi?u ch?t bo, natri, b? sung ???ng. Mua s?m quanh ra ngoi c?a c?a hng t?p ha. ?y l n?i qu v? s? d? tm th?y tri cy v rau c? t??i, nhi?u lo?i h?t, th?t t??i v s?n ph?m s?a t??i. N?u n??ng S? d?ng ca?c ph??ng php n?u ?n nhi?t ?? th?p, ch?ng h?n nh? n??ng, thay v ph??ng php n?u nhi?t ?? cao nh? chin ng?p d?u. N?u ?n b?ng cch s? d?ng cc lo?i d?u t?t cho s?c kh?e, ch?ng h?n nh? d?u  liu, d?u h?t c?i d?u ho?c d?u h??ng d??ng. Hessie Diener n?u v?i b?, kem, ho?c th?t nhi?u m?. Ln k? ho?ch cho b?a ?n ?n cc b?a ?n chnh v cc b?a ?n nh? ??u ??n, t?t nh?t l vo cng m?t th?i ?i?m m?i ngy. Trnh nhi?n ?n trong th?i [employer]. ?n th?c ?n giu ch?t x? nh? cc lo?i tri cy t??i, rau c?, ??u v ng? c?c nguyn cm. ?n 4-6 ao-x? (oz) (112-168 g) protein n?c m?i ngy, ch?ng h?n nh? th?t n?c, th?t g, c, tr?ng, ho?c ??u ph?. M?t ao-x? (oz) (28 g) protein n?c b?ng: 1 ao-x?  (oz) (28 g) th?t, th?t g ho?c c. 1 qu? tr?ng. 1?4 c?c (62 g) ??u ph?. ?n m?t s? th?c ph?m ch?a cc ch?t bo lnh m?nh m?i ngy, ch?ng h?n nh? qu? b?, qu? h?ch, cc lo?i h?t v c. Ti nn ?n nh?ng th?c ph?m no? Tri cy Qu? m?ng. To. CamSander Nephew? ?o. Qu? m?. Qu? m?n. [place]. Qu? ?u ??Sander Nephew? l?u. Qu? kiwi. Qu? anh ?o. Marlou Starks c? Toney Reil, bao g?m c?i xo?n, rau bina, kale, c?i c?u v?ng, c?i r? c?i b? xanh v c?i b?p. C? c?i ???ng. Sp l?. Bng c?i xanh. C r?t. ??u xanh. C chua. ?t ng?t. Hnh ty. D?a chu?t. C?i bruxen. Ng? c?c Ng? c?c nguyn ca?m nh? bnh m nguyn ca?m ho??c nguyn h?t, bnh quy gin, bnh m ng, ng? c?c v m ?ng. B?t y?n m?ch khng ???ng. H?t dim m?ch (quinoa). G?o l?c ho?c g?o khng xt. Th?t v cc protein khc H?i s?n. Th?t gia c?m khng da. Mi?ng n?c th?t gia c?m v th?t b. ??u ph?. Qu? h?ch. Cc lo?i h?t. S?a Cc s?n ph?m s??a khng co? ho??c co? i?t ch?t be?o, ch?ng h?n nh? s?a, s??a chua va? pho mt. Nh?ng th?c ph?m li?t k ? trn c th? khng ph?i l m?t danh m?c ??y ?? cc th?c ph?m v ?? u?ng m qu v? c th? ?n v u?ng. Lin h? v?i chuyn gia dinh d??ng ?? c thm thng tin. Ti nn trnh nh?ng th?c ph?m no? Tri cy Tri cy ?ng h?p xi-r. Marlou Starks c? Marlou Starks c? ?ng h?p. Rau ?ng l?nh v?i b? ho?c n??c s?t kem. Ng? c?c Cc s?n ph?m lm t? b?t m tr?ng v b?t m ???c tinh ch? nh? bnh m, m ?ng, ?? ?n nh? v ng? c?c. Trnh t?t c? cc th?c ph?m ch? bi?n s?n. Th?t v cc protein khc  Cc la?t thi?t m??. Th?t gia c?m c da. Th?t chin/rn ho?c t?m b?t rn. Th?t ch? bi?n s?n. Trnh cc ch?t bo bo ha. S?a S?a chua nguyn ch?t bo, pho mt ho?c s?a. ?? u?ng ?? u?ng c ???ng, ch?ng h?n nh? soda ho?c tr ?Marland Kitchen Nh?ng th?c ph?m li?t k ? trn c th? khng ph?i l m?t danh m?c ??y ?? cc lo?i th?c ph?m v ?? u?ng m qu v? nn trnh. Lin h? v?i chuyn gia dinh d??ng ?? c thm thng tin. Ca?c cu h?i c?n ??a ra v?i chuyn gia ch?m [place] s?c kh?e Ti c c?n g?p chuyn  gia gio d?c v ch?m [place] b?nh ti?u ???ng c ch?ng nh?n khng? Ti c c?n g?p chuyn gia dinh d??ng khng? Ti co? th? go?i cho s? na?o n?u ti co? th?c m?c? Khi no l th?i ?i?m ki?m tra ???ng huy?t t?t nh?t? N?i ?? tm thm thng tin: American Diabetes Association (Hi?p h?i Ti?u ????ng Hoa K?): diabetes.org Academy of Nutrition and Dietetics ([place] d??ng v ?n u?ng): eatright.[employer] of Diabetes and Digestive and Kidney Diseases (Vi?n Ti?u ???ng v cc B?nh Tiu ha v B?nh Th?n Qu?c gia): [employer].is Association of Diabetes Care & Education Specialists (Hi?p h?i Chuyn gia Gio d?c v Ch?m [place] B?nh ti?u ???ng): diabeteseducator.org Tm t?t ?i?u r?t quan tr?ng l ph?i c thi quen ?n u?ng t?t cho s?c kh?e b?i v nh?ng g qu v? ?n v u?ng ?nh h??ng l?n ??n n?ng ?? ???ng huy?t (glucose) c?a qu v?. ?i?u quan tr?ng l s? d?ng m?t r??u cch th?n tr?ng. M?t k? ho?ch ?n u?ng t?t cho s?c kh?e s? gip qu v? ki?m sot ???ng huy?t v gi?m nguy c? m?c b?nh tim. Chuyn gia ch?m [place] s?c kh?e c?a qu v? c th? [place] qu v? trao ??i v?i chuyn gia dinh d??ng ?? xy d?ng ch??ng trnh ?n u?ng t?t nh?t cho qu v?. Thng tin ny khng nh?m m?c ?ch thay th? cho l?i khuyn m chuyn gia ch?m [place] s?c kh?e ni v?i qu v?. Hy b?o ??m qu v? ph?i th?o lu?n b?t k? v?n ?? g m qu v? c v?i chuyn gia ch?m [place] s?c kh?e c?a qu v?. Document Revised: 11/16/2020 Document Reviewed: 11/16/2020 Elsevier Patient Education  [place], MD [facility]

## 2022-09-03 NOTE — Assessment & Plan Note (Signed)
Advised to stay well-hydrated and avoid NSAIDs Continue Farxiga 10 mg daily to help with diabetes and prevent further progression of chronic kidney disease.

## 2022-09-03 NOTE — Telephone Encounter (Signed)
Patient states she will be having a change in transportation and the best days for her appointments will be on Friday's, due to Dr. Barry Brunner schedule they would like to switch providers. Are both providers okay with this switch.

## 2022-09-03 NOTE — Assessment & Plan Note (Signed)
No significant physical findings.  Chronic calf pain since 2017 Pain management discussed.  No concerns.

## 2022-09-03 NOTE — Assessment & Plan Note (Signed)
Diet and nutrition discussed. Continue atorvastatin 40 mg daily. The 10-year ASCVD risk score (Arnett DK, et al., 2019) is: 17.3%   Values used to calculate the score:     Age: 67 years     Sex: Female     Is Non-Hispanic African American: No     Diabetic: Yes     Tobacco smoker: No     Systolic Blood Pressure: 123XX123 mmHg     Is BP treated: Yes     HDL Cholesterol: 44.9 mg/dL     Total Cholesterol: 173 mg/dL

## 2022-09-03 NOTE — Telephone Encounter (Signed)
Patient's wishes.  I am okay with this.  Thanks.

## 2022-09-03 NOTE — Assessment & Plan Note (Signed)
Well-controlled hypertension.  Continue lisinopril 20 mg daily. Better controlled diabetes with hemoglobin A1c less than before at 8.0. Recommend to continue glipizide 5 mg twice a day and Farxiga 10 mg daily Cardiovascular risks associated with hypertension and diabetes discussed Diet and nutrition discussed. Follow-up in 3 months.

## 2022-09-03 NOTE — Patient Instructions (Signed)
Continue present medications. Follow-up in 3 months.  B?nh ti?u ???ng v dinh d??ng, ng??i l?n Diabetes Mellitus and Nutrition, Adult Khi qu v? b? ti?u ???ng hay b?nh ti?u ???ng, ?i?u r?t quan tr?ng l ph?i c thi quen ?n u?ng t?t cho s?c kh?e b?i v nh?ng g qu v? ?n v u?ng ?nh h??ng l?n ??n n?ng ?? ???ng huy?t (glucose) c?a qu v?. ?n th?c ph?m t?t cho s?c kh?e v?i s? l??ng v?a ph?i, vo cng cc th?i ?i?m m?i ngy, c th? gip qu v?: Qu?n l ???ng huy?t. Gi?m nguy c? b? b?nh tim. C?i thi?n huy?t p. ??t ???c ho?c duy tr m??c cn n?ng c l?i cho s?c kh?e. ?i?u g c th? ?nh h??ng ??n k? ho?ch ?n u?ng c?a ti? M?i ng??i b? ti?u ???ng khc nhau v m?i ng??i ??u c nhu c?u v? ch??ng trnh ?n u?ng khc nhau. Chuyn gia ch?m [place] s?c kh?e c?a qu v? c th? [place] qu v? trao ??i v?i chuyn gia dinh d??ng ?? xy d?ng ch??ng trnh ?n u?ng t?t nh?t cho qu v?. Ch??ng trnh ?n u?ng c?a qu v? c th? thay ??i ty thu?c vo cc y?u t? nh?: L??ng calo quy? vi? c?n. Cc lo?i thu?c m quy? vi? du?ng. Cn n??ng cu?a quy? vi?. N?ng ?? ???ng huy?t, huy?t p v cholesterol c?a qu v?. M?c ?? ho?t ??ng c?a qu v?. Cc tnh tr?ng s?c kh?e khc m qu v? c, ch?ng h?n nh? b?nh tim ho?c b?nh th?n. Carbohydrate ?nh h??ng nh? th? no ??n ti? Carbohydrate, hay cn g?i l carb, ?nh h??ng ??n l??ng ???ng huy?t c?a quy? vi? nhi?u h?n b?t k? lo?i th?c ph?m no khc. ?n carb lm t?ng l??ng glucose trong mu. Bi?t l??ng carb m qu v? c th? ?n m?t cch an ton trong m?i b?a ?n c vai tr quan tr?ng. L??ng carbohydrate ny khc nhau v?i m?i ng??i. Chuyn gia dinh d??ng c?a qu v? c th? gip tnh ton l??ng carb m qu v? c?n ph?i ?n vo m?i b?a ?n chnh v m?i b?a ?n nh?. R??u ?nh h??ng ??n ti nh? th? no? R??u c th? gy gi?m ???ng huy?t (h? ???ng huy?t), ??c bi?t l n?u qu v? s? d?ng insulin ho?c u?ng m?t s? lo?i thu?c nh?t ??nh ?i?u tr? ti?u ???ng. H? ???ng huy?t c th? l m?t tnh tr?ng ?e d?a tnh [place]?ng. Cc  tri?u ch?ng c?a h? ???ng huy?t, ch?ng h?n bu?n ng?, chng m?t v l l?n, t??ng t? cc tri?u ch?ng c?a vi?c u?ng qu nhi?u r??u. Khng u?ng r??u n?u: Chuyn gia ch?m Waucoma s?c kh?e khuyn qu v? khng u?ng r??u. Qu v? c [place], c th? c [place], ho?c c k? ho?ch c [place]. N?u qu v? u?ng r??u: Gi?i h?n l??ng r??u qu v? u?ng ? m?c: 0-1 ly/ngy ??i v?i n? gi?i. 0-2 ly/ngy ??i v?i nam gi?i. Bi?t m?t ly c bao nhiu r??u. ? M?, m?t ly t??ng ???ng v?i m?t chai bia 12 ao-x? (355 mL), m?t ly r??u vang 5 ao-x? (148 mL), ho?c m?t ly r??u m?nh 1 ao-x? (44 mL). Lun b ?? n??c b?ng n??c, soda dnh cho ng??i ?n [place], ho?c tr ? khng ???ng. Orlene Plum nh? r?ng soda thng th???ng, n??c tri cy v ca?c ?? u?ng h?n h??p khc c th? ch?a nhi?u ???ng v ph?i ???c tnh l carb. C nh?ng l?i khuyn no ?? tun th? k? ho?ch ny?  ??c nhn th?c ph?m B?t ??u b?ng vi?c ki?m tra kch c? kh?u ph?n  trn nhn Thng tin dinh d??ng c?a th?c ph?m v ?? u?ng ?ng gi. S? calo v l??ng carb, ch?t bo v cc ch?t dinh d??ng khc ghi trn nhn d?a trn m?t kh?u ph?n c?a mn ?. Nhi?u mn ch?a nhi?u h?n m?t kh?u ph?n trn m?i bao b. Ki?m tra t?ng s? gram (g) carb trong m?t kh?u ph?n. Ki?m tra s? gram ch?t bo bo ha v ch?t bo chuy?n ha trong m?t kh?u ph?n. Ch?n th?c ph?m c t ho?c khng c nh?ng ch?t bo ny. Ki?m tra s? miligam (mg) mu?i (natri) trong m?t kh?u ph?n. H?u h?t m?i ng??i ??u nn gi?i h?n t?ng natri tiu thu? ?? m??c d??i 2.300 mg m?i ngy. Lun ki?m tra thng tin dinh d??ng c?a th?c ph?m ghi trn nhn l "t bo" hay "khng bo". Nh?ng th?c ph?m ny c th? c b? sung ???ng ho?c carb tinh luy?n cao h?n v c?n ph?i trnh. Hy trao ??i v?i chuyn gia dinh d??ng c?a qu v? ?? xc ??nh m?c tiu hng ngy c?a qu v? ??i v?i cc ch?t dinh d??ng li?t k trn nhn. Mua s?m Trnh mua nh?ng th?c ph?m ?ng h?p, lm s?n ho?c ch? bi?n s?n. Nh?ng th?c ph?m ny c xu h??ng c nhi?u ch?t bo, natri, b? sung ???ng. Mua s?m quanh  ra ngoi c?a c?a hng t?p ha. ?y l n?i qu v? s? d? tm th?y tri cy v rau c? t??i, nhi?u lo?i h?t, th?t t??i v s?n ph?m s?a t??i. N?u n??ng S? d?ng ca?c ph??ng php n?u ?n nhi?t ?? th?p, ch?ng h?n nh? n??ng, thay v ph??ng php n?u nhi?t ?? cao nh? chin ng?p d?u. N?u ?n b?ng cch s? d?ng cc lo?i d?u t?t cho s?c kh?e, ch?ng h?n nh? d?u  liu, d?u h?t c?i d?u ho?c d?u h??ng d??ng. Hessie Diener n?u v?i b?, kem, ho?c th?t nhi?u m?. Ln k? ho?ch cho b?a ?n ?n cc b?a ?n chnh v cc b?a ?n nh? ??u ??n, t?t nh?t l vo cng m?t th?i ?i?m m?i ngy. Trnh nhi?n ?n trong th?i [employer]. ?n th?c ?n giu ch?t x? nh? cc lo?i tri cy t??i, rau c?, ??u v ng? c?c nguyn cm. ?n 4-6 ao-x? (oz) (112-168 g) protein n?c m?i ngy, ch?ng h?n nh? th?t n?c, th?t g, c, tr?ng, ho?c ??u ph?. M?t ao-x? (oz) (28 g) protein n?c b?ng: 1 ao-x? (oz) (28 g) th?t, th?t g ho?c c. 1 qu? tr?ng. 1?4 c?c (62 g) ??u ph?. ?n m?t s? th?c ph?m ch?a cc ch?t bo lnh m?nh m?i ngy, ch?ng h?n nh? qu? b?, qu? h?ch, cc lo?i h?t v c. Ti nn ?n nh?ng th?c ph?m no? Tri cy Qu? m?ng. To. CamSander Nephew? ?o. Qu? m?. Qu? m?n. [place]. Qu? ?u ??Sander Nephew? l?u. Qu? kiwi. Qu? anh ?o. Marlou Starks c? Toney Reil, bao g?m c?i xo?n, rau bina, kale, c?i c?u v?ng, c?i r? c?i b? xanh v c?i b?p. C? c?i ???ng. Sp l?. Bng c?i xanh. C r?t. ??u xanh. C chua. ?t ng?t. Hnh ty. D?a chu?t. C?i bruxen. Ng? c?c Ng? c?c nguyn ca?m nh? bnh m nguyn ca?m ho??c nguyn h?t, bnh quy gin, bnh m ng, ng? c?c v m ?ng. B?t y?n m?ch khng ???ng. H?t dim m?ch (quinoa). G?o l?c ho?c g?o khng xt. Th?t v cc protein khc H?i s?n. Th?t gia c?m khng da. Mi?ng n?c th?t gia c?m v th?t b. ??u ph?. Qu? h?ch. Cc lo?i h?t. S?a Cc s?n ph?m s??a khng co? ho??c co?  i?t ch?t be?o, ch?ng h?n nh? s?a, s??a chua va? pho mt. Nh?ng th?c ph?m li?t k ? trn c th? khng ph?i l m?t danh m?c ??y ?? cc th?c ph?m v ?? u?ng m qu v? c th? ?n v u?ng. Lin h? v?i chuyn  gia dinh d??ng ?? c thm thng tin. Ti nn trnh nh?ng th?c ph?m no? Tri cy Tri cy ?ng h?p xi-r. Marlou Starks c? Marlou Starks c? ?ng h?p. Rau ?ng l?nh v?i b? ho?c n??c s?t kem. Ng? c?c Cc s?n ph?m lm t? b?t m tr?ng v b?t m ???c tinh ch? nh? bnh m, m ?ng, ?? ?n nh? v ng? c?c. Trnh t?t c? cc th?c ph?m ch? bi?n s?n. Th?t v cc protein khc Cc la?t thi?t m??. Th?t gia c?m c da. Th?t chin/rn ho?c t?m b?t rn. Th?t ch? bi?n s?n. Trnh cc ch?t bo bo ha. S?a S?a chua nguyn ch?t bo, pho mt ho?c s?a. ?? u?ng ?? u?ng c ???ng, ch?ng h?n nh? soda ho?c tr ?Marland Kitchen Nh?ng th?c ph?m li?t k ? trn c th? khng ph?i l m?t danh m?c ??y ?? cc lo?i th?c ph?m v ?? u?ng m qu v? nn trnh. Lin h? v?i chuyn gia dinh d??ng ?? c thm thng tin. Ca?c cu h?i c?n ??a ra v?i chuyn gia ch?m Page s?c kh?e Ti c c?n g?p chuyn gia gio d?c v ch?m [place] b?nh ti?u ???ng c ch?ng nh?n khng? Ti c c?n g?p chuyn gia dinh d??ng khng? Ti co? th? go?i cho s? na?o n?u ti co? th?c m?c? Khi no l th?i ?i?m ki?m tra ???ng huy?t t?t nh?t? N?i ?? tm thm thng tin: American Diabetes Association (Hi?p h?i Ti?u ????ng [place]?): [email or web address].org Academy of Nutrition and Dietetics ([place] d??ng v ?n u?ng): eatright[employer] of Diabetes and Digestive and Kidney Diseases (Vi?n Ti?u ???ng v cc B?nh Tiu ha v B?nh Th?n Qu?c gia): [email or web address] Association of Diabetes Care & Education Specialists (Hi?p h?i Chuyn gia Gio d?c v Ch?m [place] B?nh ti?u ???ng): [email or web address] Tm t?t ?i?u r?t quan tr?ng l ph?i c thi quen ?n u?ng t?t cho s?c kh?e b?i v nh?ng g qu v? ?n v u?ng ?nh h??ng l?n ??n n?ng ?? ???ng huy?t (glucose) c?a qu v?. ?i?u quan tr?ng l s? d?ng m?t r??u cch th?n tr?ng. M?t k? ho?ch ?n u?ng t?t cho s?c kh?e s? gip qu v? ki?m sot ???ng huy?t v gi?m nguy c? m?c b?nh tim. Chuyn gia ch?m [place] s?c kh?e c?a qu v? c th? [place] qu v? trao ??i v?i chuyn gia dinh  d??ng ?? xy d?ng ch??ng trnh ?n u?ng t?t nh?t cho qu v?. Thng tin ny khng nh?m m?c ?ch thay th? cho l?i khuyn m chuyn gia ch?m [place] s?c kh?e ni v?i qu v?. Hy b?o ??m qu v? ph?i th?o lu?n b?t k? v?n ?? g m qu v? c v?i chuyn gia ch?m [place] s?c kh?e c?a qu v?. Document Revised: 11/16/2020 Document Reviewed: 11/16/2020 Elsevier Patient Education  [place].

## 2022-09-12 ENCOUNTER — Other Ambulatory Visit (HOSPITAL_COMMUNITY): Payer: Medicare Other

## 2022-09-12 ENCOUNTER — Observation Stay (HOSPITAL_COMMUNITY): Payer: Medicare Other

## 2022-09-12 ENCOUNTER — Inpatient Hospital Stay (HOSPITAL_COMMUNITY)
Admission: EM | Admit: 2022-09-12 | Discharge: 2022-09-15 | DRG: 419 | Disposition: A | Discharge status: Home / Self Care | Payer: Medicare Other | Attending: Internal Medicine | Admitting: Internal Medicine

## 2022-09-12 ENCOUNTER — Emergency Department (HOSPITAL_COMMUNITY): Payer: Medicare Other

## 2022-09-12 ENCOUNTER — Encounter (HOSPITAL_COMMUNITY): Payer: Self-pay

## 2022-09-12 DIAGNOSIS — K8020 Calculus of gallbladder without cholecystitis without obstruction: Secondary | ICD-10-CM | POA: Insufficient documentation

## 2022-09-12 DIAGNOSIS — G8929 Other chronic pain: Secondary | ICD-10-CM

## 2022-09-12 DIAGNOSIS — R7401 Elevation of levels of liver transaminase levels: Secondary | ICD-10-CM | POA: Diagnosis present

## 2022-09-12 DIAGNOSIS — K8066 Calculus of gallbladder and bile duct with acute and chronic cholecystitis without obstruction: Secondary | ICD-10-CM | POA: Diagnosis not present

## 2022-09-12 DIAGNOSIS — R935 Abnormal findings on diagnostic imaging of other abdominal regions, including retroperitoneum: Secondary | ICD-10-CM | POA: Diagnosis not present

## 2022-09-12 DIAGNOSIS — Z8616 Personal history of COVID-19: Secondary | ICD-10-CM | POA: Diagnosis not present

## 2022-09-12 DIAGNOSIS — Z603 Acculturation difficulty: Secondary | ICD-10-CM | POA: Diagnosis present

## 2022-09-12 DIAGNOSIS — I152 Hypertension secondary to endocrine disorders: Secondary | ICD-10-CM | POA: Diagnosis present

## 2022-09-12 DIAGNOSIS — E1169 Type 2 diabetes mellitus with other specified complication: Secondary | ICD-10-CM | POA: Diagnosis present

## 2022-09-12 DIAGNOSIS — E119 Type 2 diabetes mellitus without complications: Secondary | ICD-10-CM

## 2022-09-12 DIAGNOSIS — K810 Acute cholecystitis: Secondary | ICD-10-CM | POA: Diagnosis not present

## 2022-09-12 DIAGNOSIS — K828 Other specified diseases of gallbladder: Secondary | ICD-10-CM | POA: Diagnosis not present

## 2022-09-12 DIAGNOSIS — E785 Hyperlipidemia, unspecified: Secondary | ICD-10-CM | POA: Diagnosis present

## 2022-09-12 DIAGNOSIS — I129 Hypertensive chronic kidney disease with stage 1 through stage 4 chronic kidney disease, or unspecified chronic kidney disease: Secondary | ICD-10-CM | POA: Diagnosis present

## 2022-09-12 DIAGNOSIS — E1165 Type 2 diabetes mellitus with hyperglycemia: Secondary | ICD-10-CM | POA: Diagnosis not present

## 2022-09-12 DIAGNOSIS — I10 Essential (primary) hypertension: Secondary | ICD-10-CM | POA: Diagnosis not present

## 2022-09-12 DIAGNOSIS — R1013 Epigastric pain: Secondary | ICD-10-CM | POA: Diagnosis not present

## 2022-09-12 DIAGNOSIS — K812 Acute cholecystitis with chronic cholecystitis: Secondary | ICD-10-CM | POA: Diagnosis not present

## 2022-09-12 DIAGNOSIS — E1122 Type 2 diabetes mellitus with diabetic chronic kidney disease: Secondary | ICD-10-CM | POA: Diagnosis present

## 2022-09-12 DIAGNOSIS — R7989 Other specified abnormal findings of blood chemistry: Secondary | ICD-10-CM | POA: Diagnosis present

## 2022-09-12 DIAGNOSIS — N1831 Chronic kidney disease, stage 3a: Secondary | ICD-10-CM | POA: Diagnosis not present

## 2022-09-12 DIAGNOSIS — D509 Iron deficiency anemia, unspecified: Secondary | ICD-10-CM | POA: Diagnosis present

## 2022-09-12 DIAGNOSIS — R1084 Generalized abdominal pain: Secondary | ICD-10-CM | POA: Diagnosis not present

## 2022-09-12 DIAGNOSIS — R945 Abnormal results of liver function studies: Secondary | ICD-10-CM | POA: Diagnosis not present

## 2022-09-12 DIAGNOSIS — K838 Other specified diseases of biliary tract: Secondary | ICD-10-CM | POA: Diagnosis not present

## 2022-09-12 DIAGNOSIS — Z7984 Long term (current) use of oral hypoglycemic drugs: Secondary | ICD-10-CM | POA: Diagnosis not present

## 2022-09-12 DIAGNOSIS — E1159 Type 2 diabetes mellitus with other circulatory complications: Secondary | ICD-10-CM | POA: Diagnosis not present

## 2022-09-12 DIAGNOSIS — K8000 Calculus of gallbladder with acute cholecystitis without obstruction: Secondary | ICD-10-CM | POA: Diagnosis not present

## 2022-09-12 DIAGNOSIS — I700 Atherosclerosis of aorta: Secondary | ICD-10-CM | POA: Diagnosis not present

## 2022-09-12 DIAGNOSIS — Z9049 Acquired absence of other specified parts of digestive tract: Secondary | ICD-10-CM | POA: Diagnosis not present

## 2022-09-12 DIAGNOSIS — D649 Anemia, unspecified: Secondary | ICD-10-CM | POA: Diagnosis not present

## 2022-09-12 DIAGNOSIS — Z79899 Other long term (current) drug therapy: Secondary | ICD-10-CM | POA: Diagnosis not present

## 2022-09-12 DIAGNOSIS — Z789 Other specified health status: Secondary | ICD-10-CM | POA: Insufficient documentation

## 2022-09-12 DIAGNOSIS — R1011 Right upper quadrant pain: Secondary | ICD-10-CM | POA: Diagnosis not present

## 2022-09-12 DIAGNOSIS — R109 Unspecified abdominal pain: Secondary | ICD-10-CM | POA: Diagnosis present

## 2022-09-12 HISTORY — DX: Personal history of COVID-19: Z86.16

## 2022-09-12 LAB — COMPREHENSIVE METABOLIC PANEL
ALT: 100 U/L — ABNORMAL HIGH (ref 0–44)
AST: 204 U/L — ABNORMAL HIGH (ref 15–41)
Albumin: 4.1 g/dL (ref 3.5–5.0)
Alkaline Phosphatase: 76 U/L (ref 38–126)
Anion gap: 11 (ref 5–15)
BUN: 18 mg/dL (ref 8–23)
CO2: 24 mmol/L (ref 22–32)
Calcium: 8.9 mg/dL (ref 8.9–10.3)
Chloride: 101 mmol/L (ref 98–111)
Creatinine, Ser: 0.89 mg/dL (ref 0.44–1.00)
GFR, Estimated: >60 mL/min (ref >60)
Glucose, Bld: 219 mg/dL — ABNORMAL HIGH (ref 70–99)
Potassium: 3.9 mmol/L (ref 3.5–5.1)
Sodium: 136 mmol/L (ref 135–145)
Total Bilirubin: 2.5 mg/dL — ABNORMAL HIGH (ref 0.3–1.2)
Total Protein: 8.1 g/dL (ref 6.5–8.1)

## 2022-09-12 LAB — URINALYSIS, ROUTINE W REFLEX MICROSCOPIC
Bacteria, UA: NONE SEEN
Bilirubin Urine: NEGATIVE
Glucose, UA: >=500 mg/dL — AB
Hgb urine dipstick: NEGATIVE
Ketones, ur: 5 mg/dL — AB
Leukocytes,Ua: NEGATIVE
Nitrite: NEGATIVE
Protein, ur: NEGATIVE mg/dL
Specific Gravity, Urine: 1.025 (ref 1.005–1.030)
pH: 7 (ref 5.0–8.0)

## 2022-09-12 LAB — CBC
HCT: 37 % (ref 36.0–46.0)
Hemoglobin: 11.3 g/dL — ABNORMAL LOW (ref 12.0–15.0)
MCH: 22.6 pg — ABNORMAL LOW (ref 26.0–34.0)
MCHC: 30.5 g/dL (ref 30.0–36.0)
MCV: 74 fL — ABNORMAL LOW (ref 80.0–100.0)
Platelets: 243 10*3/uL (ref 150–400)
RBC: 5 MIL/uL (ref 3.87–5.11)
RDW: 13.9 % (ref 11.5–15.5)
WBC: 19.6 10*3/uL — ABNORMAL HIGH (ref 4.0–10.5)
nRBC: 0 % (ref 0.0–0.2)

## 2022-09-12 LAB — CBG MONITORING, ED: Glucose-Capillary: 112 mg/dL — ABNORMAL HIGH (ref 70–99)

## 2022-09-12 LAB — LIPASE, BLOOD: Lipase: 42 U/L (ref 11–51)

## 2022-09-12 LAB — GLUCOSE, CAPILLARY
Glucose-Capillary: 116 mg/dL — ABNORMAL HIGH (ref 70–99)
Glucose-Capillary: 152 mg/dL — ABNORMAL HIGH (ref 70–99)

## 2022-09-12 LAB — HEPATITIS PANEL, ACUTE
HCV Ab: NONREACTIVE
Hep A IgM: NONREACTIVE
Hep B C IgM: NONREACTIVE
Hepatitis B Surface Ag: NONREACTIVE

## 2022-09-12 LAB — ACETAMINOPHEN LEVEL: Acetaminophen (Tylenol), Serum: <10 ug/mL — ABNORMAL LOW (ref 10–30)

## 2022-09-12 MED ORDER — PANTOPRAZOLE SODIUM 40 MG IV SOLR
40.0000 mg | INTRAVENOUS | Status: AC
  Administered 2022-09-12 – 2022-09-14 (×3): 40 mg via INTRAVENOUS
  Filled 2022-09-12 (×2): qty 10

## 2022-09-12 MED ORDER — MORPHINE SULFATE (PF) 4 MG/ML IV SOLN
4.0000 mg | Freq: Once | INTRAVENOUS | Status: AC
  Administered 2022-09-12: 4 mg via INTRAVENOUS
  Filled 2022-09-12: qty 1

## 2022-09-12 MED ORDER — IOHEXOL 300 MG/ML  SOLN
100.0000 mL | Freq: Once | INTRAMUSCULAR | Status: AC | PRN
Start: 2022-09-12 — End: 2022-09-12
  Administered 2022-09-12: 100 mL via INTRAVENOUS

## 2022-09-12 MED ORDER — PIPERACILLIN-TAZOBACTAM 3.375 G IVPB
3.3750 g | Freq: Three times a day (TID) | INTRAVENOUS | Status: DC
  Administered 2022-09-13 – 2022-09-15 (×7): 3.375 g via INTRAVENOUS
  Filled 2022-09-12 (×7): qty 50

## 2022-09-12 MED ORDER — ONDANSETRON HCL 4 MG/2ML IJ SOLN
4.0000 mg | Freq: Four times a day (QID) | INTRAMUSCULAR | Status: DC | PRN
  Administered 2022-09-14: 4 mg via INTRAVENOUS
  Filled 2022-09-12: qty 2

## 2022-09-12 MED ORDER — ALBUTEROL SULFATE (2.5 MG/3ML) 0.083% IN NEBU: 2.5000 mg | INHALATION_SOLUTION | RESPIRATORY_TRACT | Status: DC | PRN

## 2022-09-12 MED ORDER — ONDANSETRON HCL 4 MG/2ML IJ SOLN
4.0000 mg | Freq: Once | INTRAMUSCULAR | Status: AC
  Administered 2022-09-12: 4 mg via INTRAVENOUS
  Filled 2022-09-12: qty 2

## 2022-09-12 MED ORDER — SODIUM CHLORIDE 0.9 % IV SOLN: INTRAVENOUS | Status: AC

## 2022-09-12 MED ORDER — HYDROMORPHONE HCL 1 MG/ML IJ SOLN: 0.5000 mg | INTRAMUSCULAR | Status: DC | PRN

## 2022-09-12 MED ORDER — ONDANSETRON HCL 4 MG PO TABS
4.0000 mg | ORAL_TABLET | Freq: Four times a day (QID) | ORAL | Status: DC | PRN
  Filled 2022-09-12: qty 1

## 2022-09-12 MED ORDER — INSULIN ASPART 100 UNIT/ML IJ SOLN
0.0000 [IU] | INTRAMUSCULAR | Status: DC
  Administered 2022-09-12: 2 [IU] via SUBCUTANEOUS
  Administered 2022-09-13: 9 [IU] via SUBCUTANEOUS
  Administered 2022-09-14: 3 [IU] via SUBCUTANEOUS
  Administered 2022-09-14: 2 [IU] via SUBCUTANEOUS
  Filled 2022-09-12: qty 0.09

## 2022-09-12 MED ORDER — HYDRALAZINE HCL 20 MG/ML IJ SOLN
5.0000 mg | Freq: Four times a day (QID) | INTRAMUSCULAR | Status: DC | PRN
  Administered 2022-09-14: 5 mg via INTRAVENOUS
  Filled 2022-09-12: qty 1

## 2022-09-12 MED ORDER — SODIUM CHLORIDE 0.9 % IV BOLUS
1000.0000 mL | Freq: Once | INTRAVENOUS | Status: AC
  Administered 2022-09-12: 1000 mL via INTRAVENOUS

## 2022-09-12 MED ORDER — OXYCODONE HCL 5 MG PO TABS
5.0000 mg | ORAL_TABLET | ORAL | Status: DC | PRN
  Administered 2022-09-14 (×2): 5 mg via ORAL
  Filled 2022-09-12: qty 1

## 2022-09-12 MED ORDER — GADOBUTROL 1 MMOL/ML IV SOLN
5.0000 mL | Freq: Once | INTRAVENOUS | Status: AC | PRN
  Administered 2022-09-12: 5 mL via INTRAVENOUS

## 2022-09-12 MED ORDER — PIPERACILLIN-TAZOBACTAM 3.375 G IVPB
3.3750 g | Freq: Once | INTRAVENOUS | Status: AC
  Administered 2022-09-12: 3.375 g via INTRAVENOUS
  Filled 2022-09-12: qty 50

## 2022-09-12 NOTE — Progress Notes (Signed)
Pharmacy Antibiotic Note  Cathy Robbins is a 67 y.o. female admitted on 09/12/2022 with  IAI .  Pharmacy has been consulted for Zosyn dosing.  Plan: Zosyn 3.375g IV q8h (4 hour infusion). Will sign off     Temp (24hrs), Avg:97.8 F (36.6 C), Min:97.8 F (36.6 C), Max:97.8 F (36.6 C)  Recent Labs  Lab 09/12/22 1145  WBC 19.6*  CREATININE 0.89    Estimated Creatinine Clearance: 46.1 mL/min (by C-G formula based on SCr of 0.89 mg/dL).    No Known Allergies   Thank you for allowing pharmacy to be a part of this patient's care.  [place], [place] 09/12/2022 4:25 PM

## 2022-09-12 NOTE — ED Triage Notes (Signed)
Pt presents with c/o abdominal pain for approx 2 months.

## 2022-09-12 NOTE — Consult Note (Addendum)
Cathy Robbins  1955-09-04 [id number]  CARE TEAM:  PCP: Horald Pollen, MD  Outpatient Care Team: Patient Care Team: Horald Pollen, MD as PCP - General (Internal Medicine) Newt Minion, MD as Consulting Physician (Orthopedic Surgery)  Inpatient Treatment Team: Treatment Team: Attending Provider: Bonnielee Haff, MD; Rounding Team: Jackelyn Knife, MD; Consulting Physician: Doran Stabler, MD; Registered Nurse: Charlyne Petrin, RN; Consulting Physician: Edison Pace, Md, MD   This patient is a 67 y.o.female who presents today for surgical evaluation at the request of Pati Gallo, Anna, [facility].   Chief complaint / Reason for evaluation: Possible cholecystitis & choledocholithiasis  Woman originally from [place] but speaks no English having episode of abdominal pain persistent.  Worsening symptoms.  Came emergency department.  Some abdominal pain improved.  Workup concerning for hyperglycemia, anemia, elevated liver function tests, elevated white count.  Ultrasound revealed gallbladder gallstones with some mild thickening and mild common bile duct dilatation.  CAT scan more concerning for common bile duct dilatation.  No evidence of any pancreatitis.  No definite cholecystitis.  Patient with some persistent symptoms and discomfort.  Admission recommended.     Assessment  Cathy Robbins  67 y.o. female       Problem List:  Principal Problem:   Abdominal pain Active Problems:   Hyperlipidemia   Uncontrolled type 2 diabetes mellitus with hyperglycemia (HCC)   Hypertension associated with diabetes ([place])   Stage 3a chronic kidney disease (HCC)   Transaminitis   Cholelithiasis   Elevated LFTs   Non-English speaking patient ([place])   Anemia   Probable cholecystitis and choledocholithiasis in the setting of stones.  Plan:  Agree with admission.  Medicine already on board.  Nausea and pain control  IV antibiotics.  Agree with piperacillin/tazobactam for now  since the significant leukocytosis raises the concern of cholangitis, especially in a diabetic on multiple medications coming in with hyperglycemia.  Agree with gastrology consultation.  I believe an MRCP is being ordered to help clarify if patient truly does have choledocholithiasis or other concerns for common bile duct dilatation.  Correcting hyperglycemia.  Persistent anemia of uncertain etiology.  Agree with iron studies and workup.  Has this person never had upper/lower endoscopy?  No obvious colon mass or tumor or gastric mass.  No obvious pancreatic or bile mass.  Follow liver function tests & and labs.  If it looks like LFTs are stabilizing and improving and her pain is less, may proceed with laparoscopic cholecystectomy with cholangiogram first.  If concerns of choledocholithiasis or MRCP or worsening hyperbilirubinemia/elevated LFTs; then, most likely would benefit from ERCP first with interval cholecystectomy later in the hospitalization.  We will see.  Surgery will help follow.     I reviewed nursing notes, ED provider notes, hospitalist notes, last 24 h vitals and pain scores, last 48 h intake and output, last 24 h labs and trends, and last 24 h imaging results. I have reviewed this patient's available data, including medical history, events of note, test results, etc as part of my evaluation.  A significant portion of that time was spent in counseling.  Care during the described time interval was provided by me.  This care required moderate level of medical decision making.  09/12/2022  Adin Hector, MD, FACS, MASCRS Esophageal, Gastrointestinal & Colorectal Surgery Robotic and Minimally Invasive Surgery  [facility] [address], [place], [place] [address] [phone] Fax [phone]  [id number] Main  CONTACT INFORMATION:  Weekday (9AM-5PM): Call [facility] main office at [phone]  Weeknight (5PM-9AM) or  Weekend/Holiday: Check [email or web address] (password " [email or web address]") for General Surgery [facility] coverage  (Please, do not use SecureChat as it is not reliable communication to reach operating surgeons for immediate patient care given surgeries/outpatient duties/clinic/cross-coverage/off post-call which would lead to a delay in care.  Epic staff messaging available for outptient concerns, but may not be answered for 48 hours or more).     09/12/2022      Past Medical History:  Diagnosis Date   Chronic pain of left lower extremity 07/14/2019   Diabetes mellitus without complication ([place])    History of COVID-19 09/12/2022   Hyperlipidemia 11/20/2015   Hypertension associated with diabetes ([place]) 01/03/2022   Stage 3a chronic kidney disease ([place]) 06/04/2022    History reviewed. No pertinent surgical history.  Social History   Socioeconomic History   Marital status: Married    Spouse name: Not on file   Number of children: 4   Years of education: Not on file   Highest education level: Not on file  Occupational History   Not on file  Tobacco Use   Smoking status: Never   Smokeless tobacco: Never  Vaping Use   Vaping Use: Never used  Substance and Sexual Activity   Alcohol use: No   Drug use: No   Sexual activity: Not Currently  Other Topics Concern   Not on file  Social History Narrative   Not on file   Social Determinants of Health   Financial Resource Strain: Low Risk  (01/12/2019)   Overall Financial Resource Strain (CARDIA)    Difficulty of Paying Living Expenses: Not hard at all  Food Insecurity: No Food Insecurity (01/12/2019)   Hunger Vital Sign    Worried About Running Out of Food in the Last Year: Never true    [place] in the Last Year: Never true  Transportation Needs: No Transportation Needs (01/12/2019)   PRAPARE - Hydrologist (Medical): No    Lack of Transportation (Non-Medical): No  Physical Activity: Sufficiently Active (01/12/2019)    Exercise Vital Sign    Days of Exercise per Week: 5 days    Minutes of Exercise per Session: 30 min  Stress: No Stress Concern Present (01/12/2019)   [place]    Feeling of Stress : Not at all  Social Connections: Moderately Isolated (01/12/2019)   Social Connection and Isolation Panel [NHANES]    Frequency of Communication with Friends and Family: Twice a week    Frequency of Social Gatherings with Friends and Family: Once a week    Attends Religious Services: Never    Marine scientist or Organizations: No    Attends Archivist Meetings: Never    Marital Status: Married  Human resources officer Violence: Not At Risk (01/12/2019)   Humiliation, Afraid, Rape, and Kick questionnaire    Fear of Current or Ex-Partner: No    Emotionally Abused: No    Physically Abused: No    Sexually Abused: No    History reviewed. No pertinent family history.  Current Facility-Administered Medications  Medication Dose Route Frequency Provider Last Rate Last Admin   0.9 %  sodium chloride infusion   Intravenous Continuous Bonnielee Haff, MD 75 mL/hr at 09/12/22 1753 New Bag at 09/12/22 1753   albuterol (PROVENTIL) (2.5 MG/3ML) 0.083% nebulizer solution 2.5 mg  2.5 mg  Nebulization Q2H PRN Bonnielee Haff, MD       hydrALAZINE (APRESOLINE) injection 5 mg  5 mg Intravenous Q6H PRN Bonnielee Haff, MD       HYDROmorphone (DILAUDID) injection 0.5-1 mg  0.5-1 mg Intravenous Q2H PRN Bonnielee Haff, MD       insulin aspart (novoLOG) injection 0-9 Units  0-9 Units Subcutaneous Q4H Bonnielee Haff, MD       ondansetron [facility]) tablet 4 mg  4 mg Oral Q6H PRN Bonnielee Haff, MD       Or   ondansetron [facility]) injection 4 mg  4 mg Intravenous Q6H PRN Bonnielee Haff, MD       oxyCODONE (Oxy IR/ROXICODONE) immediate release tablet 5 mg  5 mg Oral Q4H PRN Bonnielee Haff, MD       pantoprazole (PROTONIX) injection 40 mg  40 mg Intravenous  Q24H Bonnielee Haff, MD       piperacillin-tazobactam (ZOSYN) IVPB 3.375 g  3.375 g Intravenous Once Adrian Saran, RPH 12.5 mL/hr at 09/12/22 1730 3.375 g at 09/12/22 1730   [START ON 09/13/2022] piperacillin-tazobactam (ZOSYN) IVPB 3.375 g  3.375 g Intravenous Q8H Arlyn Dunning M, RPH         No Known Allergies   BP 134/64 (BP Location: Left Arm)   Pulse 78   Temp 97.8 F (36.6 C)   Resp 16   Ht '5\' 2"'$  (1.575 m)   Wt 49.6 kg   SpO2 98%   BMI 19.99 kg/m     Results:   Labs: Results for orders placed or performed during the hospital encounter of 09/12/22 (from the past 48 hour(s))  Urinalysis, Routine w reflex microscopic -Urine, Clean Catch     Status: Abnormal   Collection Time: 09/12/22 10:53 AM  Result Value Ref Range   Color, Urine YELLOW YELLOW   APPearance CLEAR CLEAR   Specific Gravity, Urine 1.025 1.005 - 1.030   pH 7.0 5.0 - 8.0   Glucose, UA >=500 (A) NEGATIVE mg/dL   Hgb urine dipstick NEGATIVE NEGATIVE   Bilirubin Urine NEGATIVE NEGATIVE   Ketones, ur 5 (A) NEGATIVE mg/dL   Protein, ur NEGATIVE NEGATIVE mg/dL   Nitrite NEGATIVE NEGATIVE   Leukocytes,Ua NEGATIVE NEGATIVE   RBC / HPF 0-5 0 - 5 RBC/hpf   WBC, UA 0-5 0 - 5 WBC/hpf   Bacteria, UA NONE SEEN NONE SEEN   Squamous Epithelial / HPF 0-5 0 - 5 /HPF    Comment: Performed at [facility], [place] [address], [place], [place] [address]  Lipase, blood     Status: None   Collection Time: 09/12/22 11:45 AM  Result Value Ref Range   Lipase 42 11 - 51 U/L    Comment: Performed at [facility], [place] [address] [place], [place] [address]  Comprehensive metabolic panel     Status: Abnormal   Collection Time: 09/12/22 11:45 AM  Result Value Ref Range   Sodium 136 135 - 145 mmol/L   Potassium 3.9 3.5 - 5.1 mmol/L   Chloride 101 98 - 111 mmol/L   CO2 24 22 - 32 mmol/L   Glucose, Bld 219 (H) 70 - 99 mg/dL    Comment: Glucose reference range applies only to samples taken  after fasting for at least 8 hours.   BUN 18 8 - 23 mg/dL   Creatinine, Ser 0.89 0.44 - 1.00 mg/dL   Calcium 8.9 8.9 - 10.3 mg/dL   Total Protein 8.1 6.5 - 8.1 g/dL  Albumin 4.1 3.5 - 5.0 g/dL   AST 204 (H) 15 - 41 U/L   ALT 100 (H) 0 - 44 U/L   Alkaline Phosphatase 76 38 - 126 U/L   Total Bilirubin 2.5 (H) 0.3 - 1.2 mg/dL   GFR, Estimated >60 >60 mL/min    Comment: (NOTE) Calculated using the CKD-EPI Creatinine Equation (2021)    Anion gap 11 5 - 15    Comment: Performed at [facility], [place] [address] [place], [place] [address]  CBC     Status: Abnormal   Collection Time: 09/12/22 11:45 AM  Result Value Ref Range   WBC 19.6 (H) 4.0 - 10.5 K/uL   RBC 5.00 3.87 - 5.11 MIL/uL   Hemoglobin 11.3 (L) 12.0 - 15.0 g/dL   HCT 37.0 36.0 - 46.0 %   MCV 74.0 (L) 80.0 - 100.0 fL   MCH 22.6 (L) 26.0 - 34.0 pg   MCHC 30.5 30.0 - 36.0 g/dL   RDW 13.9 11.5 - 15.5 %   Platelets 243 150 - 400 K/uL   nRBC 0.0 0.0 - 0.2 %    Comment: Performed at [facility], [place] [address] [place], [place] [address]  Acetaminophen level     Status: Abnormal   Collection Time: 09/12/22  2:41 PM  Result Value Ref Range   Acetaminophen (Tylenol), Serum <10 (L) 10 - 30 ug/mL    Comment: (NOTE) Therapeutic concentrations vary significantly. A range of 10-30 ug/mL  may be an effective concentration for many patients. However, some  are best treated at concentrations outside of this range. Acetaminophen concentrations >150 ug/mL at 4 hours after ingestion  and >50 ug/mL at 12 hours after ingestion are often associated with  toxic reactions.  Performed at [facility], [place] [address], [place], [place] [address]   CBG monitoring, ED     Status: Abnormal   Collection Time: 09/12/22  5:14 PM  Result Value Ref Range   Glucose-Capillary 112 (H) 70 - 99 mg/dL    Comment: Glucose reference range applies only to samples taken after fasting for at least 8  hours.    Imaging / Studies: CT ABDOMEN PELVIS W CONTRAST  Result Date: 09/12/2022 CLINICAL DATA:  Epigastric pain, intermittent for 4 months EXAM: CT ABDOMEN AND PELVIS WITH CONTRAST TECHNIQUE: Multidetector CT imaging of the abdomen and pelvis was performed using the standard protocol following bolus administration of intravenous contrast. RADIATION DOSE REDUCTION: This exam was performed according to the departmental dose-optimization program which includes automated exposure control, adjustment of the [place] and/or kV according to patient size and/or use of iterative reconstruction technique. CONTRAST:  186m OMNIPAQUE IOHEXOL 300 MG/ML  SOLN COMPARISON:  09/12/2022 FINDINGS: Lower chest: No acute pleural or parenchymal lung disease. Hepatobiliary: There is moderate gallbladder distension, with small calcified gallstones layering dependently. Borderline gallbladder wall thickening measuring 3-4 mm. No pericholecystic fluid. The liver is unremarkable. Common bile duct is dilated measuring up to 11 mm in diameter. No intrahepatic duct dilation. No choledocholithiasis. Pancreas: Unremarkable. No pancreatic ductal dilatation or surrounding inflammatory changes. Spleen: Normal in size without focal abnormality. Adrenals/Urinary Tract: Adrenal glands are unremarkable. Kidneys are normal, without renal calculi, focal lesion, or hydronephrosis. Bladder is unremarkable. Stomach/Bowel: No bowel obstruction or ileus. The appendix is not identified. No bowel wall thickening or inflammatory change. Vascular/Lymphatic: Aortic atherosclerosis with irregular atheromatous plaque through the out the infrarenal abdominal aorta. No evidence of dissection or high-grade stenosis. Prominent atherosclerosis of the  bilateral iliac arteries, with estimated 50% stenosis within the right common iliac artery just proximal to the bifurcation. Proximal outflow vessels appear unremarkable. No pathologic adenopathy. Reproductive: Uterus and  bilateral adnexa are unremarkable. Other: No free fluid or free intraperitoneal gas. No abdominal wall hernia. Musculoskeletal: No acute or destructive bony lesions. Reconstructed images demonstrate no additional findings. IMPRESSION: 1. Distended gallbladder, with cholelithiasis and mild gallbladder wall thickening as above. Findings are equivocal for acute cholecystitis. If further evaluation is desired, nuclear medicine hepatobiliary scan could be performed. 2. Dilated common bile duct without intrahepatic biliary duct dilation. This is of uncertain etiology, with no evidence of choledocholithiasis or obstructing mass identified. If further evaluation is desired, ERCP or MRCP could be considered. 3.  Aortic Atherosclerosis (ICD10-I70.0). Electronically Signed   By: Randa Ngo M.D.   On: 09/12/2022 16:38   US Abdomen Limited  Result Date: 09/12/2022 CLINICAL DATA:  Abdominal pain EXAM: ULTRASOUND ABDOMEN LIMITED RIGHT UPPER QUADRANT COMPARISON:  None Available. FINDINGS: Gallbladder: Mildly distended gallbladder with layering echogenic nonshadowing material along the gallbladder wall measuring up to 1.7 cm. No shadowing gallstones. Negative sonographic Murphy sign. Minimal nonspecific wall thickening. Common bile duct: Diameter: 8.5 mm, which is considered mildly dilated. No intrahepatic ductal dilation. No obstructing lesion identified sonographically. Liver: No focal lesion identified. Within normal limits in parenchymal echogenicity. Portal vein is patent on color Doppler imaging with normal direction of blood flow towards the liver. Other: None. IMPRESSION: Mildly dilated common bile duct, recommend correlation with LFTs for any signs of biliary obstruction. The gallbladder is mildly distended with probable layering echogenic sludge, with minimal wall thickening and a negative sonographic Murphy sign. No shadowing gallstones identified. Electronically Signed   By: Maurine Simmering M.D.   On: 09/12/2022  15:13    Medications / Allergies: per chart  Antibiotics: Anti-infectives (From admission, onward)    Start     Dose/Rate Route Frequency Ordered Stop   09/13/22 0200  piperacillin-tazobactam (ZOSYN) IVPB 3.375 g        3.375 g 12.5 mL/hr over 240 Minutes Intravenous Every 8 hours 09/12/22 1627     09/12/22 1630  piperacillin-tazobactam (ZOSYN) IVPB 3.375 g        3.375 g 12.5 mL/hr over 240 Minutes Intravenous  Once 09/12/22 1627           Note: Portions of this report may have been transcribed using voice recognition software. Every effort was made to ensure accuracy; however, inadvertent computerized transcription errors may be present.   Any transcriptional errors that result from this process are unintentional.    Adin Hector, MD, FACS, MASCRS Esophageal, Gastrointestinal & Colorectal Surgery Robotic and Minimally Invasive Surgery  [facility] [address] [place], [place] [address] [phone] Fax [phone] Main  CONTACT INFORMATION:  Weekday (9AM-5PM): Call [facility] main office at [phone]  Weeknight (5PM-9AM) or Weekend/Holiday: Check [email or web address] (password " [email or web address]") for General Surgery [facility] coverage  (Please, do not use SecureChat as it is not reliable communication to reach operating surgeons for immediate patient care given surgeries/outpatient duties/clinic/cross-coverage/off post-call which would lead to a delay in care.  Epic staff messaging available for outptient concerns, but may not be answered for 48 hours or more).      09/12/2022  6:31 PM

## 2022-09-12 NOTE — ED Provider Notes (Signed)
Accepted handoff at shift change from BT PA-C. Please see prior provider note for more detail.   Briefly: Patient is 67 y.o.   DDX: concern for  Plan: FU on imaging     Physical Exam  BP (!) 152/63   Pulse 69   Temp 97.8 F (36.6 C)   Resp 16   SpO2 100%   Physical Exam Vitals and nursing note reviewed.  Constitutional:      General: She is not in acute distress.    Appearance: Normal appearance. She is not ill-appearing.  HENT:     Head: Normocephalic and atraumatic.  Eyes:     General: No scleral icterus.       Right eye: No discharge.        Left eye: No discharge.     Conjunctiva/sclera: Conjunctivae normal.  Pulmonary:     Effort: Pulmonary effort is normal.     Breath sounds: No stridor.  Abdominal:     Tenderness: There is abdominal tenderness in the epigastric area.  Neurological:     Mental Status: She is alert and oriented to person, place, and time. Mental status is at baseline.     Procedures  Procedures  ED Course / MDM   Clinical Course as of 09/12/22 1741  Thu Sep 12, 2022  1523 GI, hospitalist, surg [WF]  1553 Usually only an hour of pain per episode but now since 7am Last PO - 6:30am [WF]  [id number] Discussed with hospitalist who will admit.  Awaiting GI consult and recs [WF]    Clinical Course User Index [WF] Tedd Sias, PA   Medical Decision Making Amount and/or Complexity of Data Reviewed Labs: ordered. Radiology: ordered. ECG/medicine tests: ordered.  Risk Prescription drug management. Decision regarding hospitalization.   Discussed with Dr. Bonnielee Haff  Discussed with Nevin Bloodgood of [facility] who is ordering MRCP and initiating abx.   Discussed with Dr. Johney Maine of gen surg who will see in consultation.   She was initiated on antibiotics and MRCP was ordered.    Tedd Sias, [place] 09/12/22 1834    Lorelle Gibbs, DO 09/12/22 2039

## 2022-09-12 NOTE — H&P (Signed)
[facility] History and Physical  Jase Limes ZM:[id number] DOB: 09-20-1955 DOA: 09/12/2022   PCP: Horald Pollen, MD  Specialists: None  Chief Complaint: Abdominal pain  HPI: Cathy Robbins is a 67 y.o. female with a past medical history of hyperlipidemia, diabetes mellitus type 2, essential hypertension who presented with complaints of abdominal pain.  Patient has had recurrent upper abdominal pain ongoing for several months with recent worsening.  No fever or chills.  No nausea or vomiting.  No chest pain.  No shortness of breath.  No change in symptoms with meal intake.  No weight loss.  Patient unable to rate her pain at this time.  Pain has improved after she was given pain medications in the emergency department.  In the emergency department she was found to have abnormal LFTs with elevated AST ALT and bilirubin.  Alkaline phosphatase normal.  Right upper quadrant ultrasound showed dilated CBD but sludge in the gallbladder and thickened wall.  Home Medications: Prior to Admission medications   Medication Sig Start Date End Date Taking? Authorizing Provider  atorvastatin (LIPITOR) 40 MG tablet Take 1 tablet (40 mg total) by mouth daily at 6 PM. 01/29/22   Sagardia, Ines Bloomer, MD  Blood Glucose Calibration (CONTOUR NEXT CONTROL) Normal SOLN 1 drop by In Vitro route as needed. 12/19/17   McVey, Gelene Mink, PA-C  dapagliflozin propanediol (FARXIGA) 10 MG TABS tablet Take 1 tablet (10 mg total) by mouth daily before breakfast. 02/26/22 02/21/23  Horald Pollen, MD  gabapentin (NEURONTIN) 100 MG capsule Take 1 capsule (100 mg total) by mouth 3 (three) times daily. Patient not taking: Reported on 09/03/2022 04/18/22   Horald Pollen, MD  glipiZIDE (GLUCOTROL) 5 MG tablet Take 1 tablet (5 mg total) by mouth 2 (two) times daily before a meal. 06/04/22 05/30/23  Horald Pollen, MD  lisinopril (ZESTRIL) 20 MG tablet Take 1 tablet (20 mg total) by mouth daily. 01/29/22    Horald Pollen, MD    Allergies: No Known Allergies  Past Medical History: Past Medical History:  Diagnosis Date   Diabetes mellitus without complication ([place])     History reviewed. No pertinent surgical history.  Social History: Lives with her husband.  No history of smoking alcohol use or illicit drug use.   Family History: Denies any health problems in family members  Review of Systems - History obtained from the patient and her husband using an interpreter General ROS: positive for  - malaise Psychological ROS: negative Ophthalmic ROS: negative ENT ROS: negative Allergy and Immunology ROS: negative Hematological and Lymphatic ROS: negative Endocrine ROS: negative Respiratory ROS: no cough, shortness of breath, or wheezing Cardiovascular ROS: no chest pain or dyspnea on exertion Gastrointestinal ROS: As in HPI Genito-Urinary ROS: no dysuria, trouble voiding, or hematuria Musculoskeletal ROS: negative Neurological ROS: no TIA or stroke symptoms Dermatological ROS: negative  Physical Examination  Vitals:   09/12/22 1052 09/12/22 1347 09/12/22 1400  BP: (!) 144/61 (!) 152/63   Pulse: (!) 54 69   Resp: 18 16   Temp: 97.8 F (36.6 C)  97.8 F (36.6 C)  TempSrc: Oral    SpO2: 97% 100%     BP (!) 152/63   Pulse 69   Temp 97.8 F (36.6 C)   Resp 16   SpO2 100%   General appearance: alert, cooperative, appears stated age, and no distress Head: Normocephalic, without obvious abnormality, atraumatic Eyes: conjunctivae/corneas clear. PERRL, EOM's intact.  Throat: lips, mucosa, and tongue normal;  teeth and gums normal Neck: no adenopathy, no carotid bruit, no JVD, supple, symmetrical, trachea midline, and thyroid not enlarged, symmetric, no tenderness/mass/nodules Resp: clear to auscultation bilaterally Cardio: regular rate and rhythm, S1, S2 normal, no murmur, click, rub or gallop GI: soft, non-tender; bowel sounds normal; no masses,  no  organomegaly Extremities: extremities normal, atraumatic, no cyanosis or edema Pulses: 2+ and symmetric Skin: Skin color, texture, turgor normal. No rashes or lesions Lymph nodes: Cervical, supraclavicular, and axillary nodes normal. Neurologic: No obvious focal neurological deficits noted.    Labs on Admission: I have personally reviewed following labs and imaging studies  CBC: Recent Labs  Lab 09/12/22 1145  WBC 19.6*  HGB 11.3*  HCT 37.0  MCV 74.0*  PLT 0000000   Basic Metabolic Panel: Recent Labs  Lab 09/12/22 1145  NA 136  K 3.9  CL 101  CO2 24  GLUCOSE 219*  BUN 18  CREATININE 0.89  CALCIUM 8.9   GFR: Estimated Creatinine Clearance: 46.1 mL/min (by C-G formula based on SCr of 0.89 mg/dL). Liver Function Tests: Recent Labs  Lab 09/12/22 1145  AST 204*  ALT 100*  ALKPHOS 76  BILITOT 2.5*  PROT 8.1  ALBUMIN 4.1   Recent Labs  Lab 09/12/22 1145  LIPASE 42     Radiological Exams on Admission: US Abdomen Limited  Result Date: 09/12/2022 CLINICAL DATA:  Abdominal pain EXAM: ULTRASOUND ABDOMEN LIMITED RIGHT UPPER QUADRANT COMPARISON:  None Available. FINDINGS: Gallbladder: Mildly distended gallbladder with layering echogenic nonshadowing material along the gallbladder wall measuring up to 1.7 cm. No shadowing gallstones. Negative sonographic Murphy sign. Minimal nonspecific wall thickening. Common bile duct: Diameter: 8.5 mm, which is considered mildly dilated. No intrahepatic ductal dilation. No obstructing lesion identified sonographically. Liver: No focal lesion identified. Within normal limits in parenchymal echogenicity. Portal vein is patent on color Doppler imaging with normal direction of blood flow towards the liver. Other: None. IMPRESSION: Mildly dilated common bile duct, recommend correlation with LFTs for any signs of biliary obstruction. The gallbladder is mildly distended with probable layering echogenic sludge, with minimal wall thickening and a  negative sonographic Murphy sign. No shadowing gallstones identified. Electronically Signed   By: Maurine Simmering M.D.   On: 09/12/2022 15:13    My interpretation of Electrocardiogram: Sinus rhythm in the 50s.  Diffuse ST abnormalities noted.  Nonspecific T wave changes.  ST changes similar to previous EKG from 2017.   Problem List  Principal Problem:   Abdominal pain Active Problems:   Hyperlipidemia   Uncontrolled type 2 diabetes mellitus with hyperglycemia (HCC)   Hypertension associated with diabetes ([place])   Stage 3a chronic kidney disease (HCC)   Transaminitis   Assessment: 67 year old female of [place] origin who presents with abdominal pain and is found to have abnormal LFTs.  Right upper quadrant ultrasound showed mildly dilated common bile duct.  Gallbladder is mildly distended with minimally thickened wall.  No gallstones identified.  Symptoms are likely secondary to biliary disease.  Lipase level noted to be normal.  Plan: #1. Abdominal pain with transaminitis: Likely secondary to biliary disease.  No evidence for pancreatitis based on normal lipase level.  CT of the abdomen pelvis is pending.  EDP is reaching out to GI.  Patient may need MRCP.  May need to be seen by general surgery.  Will check hepatitis panel.  Will place her on Zosyn for now.  N.p.o. for now.  PPI for now.  #2. Diabetes mellitus type 2, uncontrolled with hyperglycemia:  Place her on SSI.  Hold her oral agents for now.  #3.  Essential hypertension: Hydralazine as needed.  Hold her lisinopril for now.  #4.  Microcytic anemia: No evidence of overt bleeding.  Check anemia panel in the morning.   DVT Prophylaxis: SCDs for now Code Status: Full code Family Communication: Discussed with her husband Disposition: Hopefully return home in improved Consults called: Gastroenterology being called by EDP Admission Status: Status is: Observation The patient remains OBS appropriate and will d/c before 2  midnights.    Severity of Illness: The appropriate patient status for this patient is OBSERVATION. Observation status is judged to be reasonable and necessary in order to provide the required intensity of service to ensure the patient's safety. The patient's presenting symptoms, physical exam findings, and initial radiographic and laboratory data in the context of their medical condition is felt to place them at decreased risk for further clinical deterioration. Furthermore, it is anticipated that the patient will be medically stable for discharge from the hospital within 2 midnights of admission.    Further management decisions will depend on results of further testing and patient's response to treatment.   Faraz Ponciano [employer]  [facility] operational officer on [email or web address]  09/12/2022, 4:17 PM

## 2022-09-12 NOTE — Progress Notes (Signed)
Attempted to get patient for MRI scan at 9:30pm on 09/12/2022. Patient unable to answer screening questions using video interpreter. RN to let MRI department know when patient's spouse or daughter returns so the patient can be screened for MRI.

## 2022-09-12 NOTE — ED Provider Notes (Signed)
[facility] Provider Note   CSN: [id number] Arrival date & time: 09/12/22  1044     History  Chief Complaint  Patient presents with   Abdominal Pain    Mertha Ely is a 67 y.o. female.  The history is provided by the patient and medical records. The history is limited by a language barrier. A language interpreter was used.  Abdominal Pain    67 year old vietnamese speaking female presenting with complaint of abdominal pain.  Patient had significant history of diabetes, hypertension, chronic kidney disease, hyperlipidemia, endorsing abdominal pain.  She has recurrent upper abdominal pain ongoing for the past several months.  Recently endorsed worsening of abdominal discomfort.  Does not endorse any postprandial pain no fever chills no chest pain or shortness of breath no nausea or vomiting but does endorse some mild constipation.  She admits she has history of diabetes.  No recent sick contact no urinary symptoms.  There is a language barrier however I was able to communicate with her caregiver and family member.  Patient denies alcohol or tobacco use.  Home Medications Prior to Admission medications   Medication Sig Start Date End Date Taking? Authorizing Provider  atorvastatin (LIPITOR) 40 MG tablet Take 1 tablet (40 mg total) by mouth daily at 6 PM. 01/29/22   Sagardia, Ines Bloomer, MD  Blood Glucose Calibration (CONTOUR NEXT CONTROL) Normal SOLN 1 drop by In Vitro route as needed. 12/19/17   McVey, Gelene Mink, PA-C  dapagliflozin propanediol (FARXIGA) 10 MG TABS tablet Take 1 tablet (10 mg total) by mouth daily before breakfast. 02/26/22 02/21/23  Horald Pollen, MD  gabapentin (NEURONTIN) 100 MG capsule Take 1 capsule (100 mg total) by mouth 3 (three) times daily. Patient not taking: Reported on 09/03/2022 04/18/22   Horald Pollen, MD  glipiZIDE (GLUCOTROL) 5 MG tablet Take 1 tablet (5 mg total) by mouth 2 (two) times daily  before a meal. 06/04/22 05/30/23  Horald Pollen, MD  lisinopril (ZESTRIL) 20 MG tablet Take 1 tablet (20 mg total) by mouth daily. 01/29/22   Horald Pollen, MD      Allergies    Patient has no known allergies.    Review of Systems   Review of Systems  Gastrointestinal:  Positive for abdominal pain.  All other systems reviewed and are negative.   Physical Exam Updated Vital Signs BP (!) 152/63   Pulse 69   Temp 97.8 F (36.6 C)   Resp 16   SpO2 100%  Physical Exam Vitals and nursing note reviewed.  Constitutional:      General: She is not in acute distress.    Appearance: She is well-developed.  HENT:     Head: Atraumatic.  Eyes:     Conjunctiva/sclera: Conjunctivae normal.  Cardiovascular:     Rate and Rhythm: Normal rate and regular rhythm.  Pulmonary:     Effort: Pulmonary effort is normal.  Abdominal:     General: Abdomen is flat.     Palpations: Abdomen is soft.     Tenderness: There is abdominal tenderness in the epigastric area. There is no guarding. Negative signs include Murphy's sign and McBurney's sign.     Hernia: No hernia is present.  Musculoskeletal:     Cervical back: Neck supple.  Skin:    Findings: No rash.  Neurological:     Mental Status: She is alert.  Psychiatric:        Mood and Affect: Mood normal.  ED Results / Procedures / Treatments   Labs (all labs ordered are listed, but only abnormal results are displayed) Labs Reviewed  COMPREHENSIVE METABOLIC PANEL - Abnormal; Notable for the following components:      Result Value   Glucose, Bld 219 (*)    AST 204 (*)    ALT 100 (*)    Total Bilirubin 2.5 (*)    All other components within normal limits  CBC - Abnormal; Notable for the following components:   WBC 19.6 (*)    Hemoglobin 11.3 (*)    MCV 74.0 (*)    MCH 22.6 (*)    All other components within normal limits  URINALYSIS, ROUTINE W REFLEX MICROSCOPIC - Abnormal; Notable for the following components:    Glucose, UA >=500 (*)    Ketones, ur 5 (*)    All other components within normal limits  LIPASE, BLOOD  HEPATITIS PANEL, ACUTE  ACETAMINOPHEN LEVEL    EKG EKG Interpretation  Date/Time:  Thursday September 12 2022 11:16:53 EDT Ventricular Rate:  56 PR Interval:  163 QRS Duration: 107 QT Interval:  494 QTC Calculation: 477 R Axis:   88 Text Interpretation: Sinus rhythm Borderline right axis deviation Baseline wander in lead(s) V3 No significant change since last tracing Confirmed by Blanchie Dessert [phone]) on 09/12/2022 2:41:20 PM  Radiology No results found.  Procedures Procedures    Medications Ordered in ED Medications  morphine (PF) 4 MG/ML injection 4 mg (has no administration in time range)  ondansetron (ZOFRAN) injection 4 mg (has no administration in time range)  sodium chloride 0.9 % bolus 1,000 mL (has no administration in time range)    ED Course/ Medical Decision Making/ A&P                             Medical Decision Making Amount and/or Complexity of Data Reviewed Labs: ordered. Radiology: ordered. ECG/medicine tests: ordered.  Risk Prescription drug management.   BP (!) 152/63   Pulse 69   Temp 97.8 F (36.6 C)   Resp 16   SpO2 100%   76:110 PM  67 year old vietnamese speaking female presenting with complaint of abdominal pain.  Patient had significant history of diabetes, hypertension, chronic kidney disease, hyperlipidemia, endorsing abdominal pain.  She has recurrent upper abdominal pain ongoing for the past several months.  Recently endorsed worsening of abdominal discomfort.  Does not endorse any postprandial pain no fever chills no chest pain or shortness of breath no nausea or vomiting but does endorse some mild constipation.  She admits she has history of diabetes.  No recent sick contact no urinary symptoms.  There is a language barrier however I was able to communicate with her caregiver and family member.  Patient denies alcohol or tobacco  use.  On exam, this is a frail and pale appearing female in no acute discomfort.  Heart with normal rate and rhythm, lungs are clear to auscultation bilaterally abdomen is soft with some epigastric tenderness but no guarding or rebound tenderness.  Negative Murphy sign, no pain at McBurney's point.  Vital signs remarkable for elevated blood pressure of 152/63.  This is likely in the setting of pain.  She is afebrile and no hypoxia.  -Labs ordered, independently viewed and interpreted by me.  Labs remarkable for WBC 19.6, with transaminitis AST 204, ALT 100, and total bili 2.5.  CBG 219 without anion gap.  -The patient was maintained on a cardiac monitor.  I personally viewed and interpreted the cardiac monitored which showed an underlying rhythm of: NSR -Imaging have been ordered by me; pending result of limited abdominal US and abd/pelvis CT -This patient presents to the ED for concern of abd pain, this involves an extensive number of treatment options, and is a complaint that carries with it a high risk of complications and morbidity.  The differential diagnosis includes cholecystitis, choledocholithiasis, gastritis, GERD, pancreatitis, appendicitis, colitis, hepatitis -Co morbidities that complicate the patient evaluation includes DM, HTN, CKD -Treatment includes morphine, IVF -Reevaluation of the patient after these medicines showed that the patient improved -PCP office notes or outside notes reviewed -Discussion with attending Dr. Maryan Rued -Escalation to admission/observation considered: patient's disposition is pending work up and reassessment from oncoming provider.   Patient here with epigastric abdominal pain which has been ongoing issue for several months.  Labs obtained today remarkable for elevated white count of 19.6 as well as evidence of transaminitis with AST 204, ALT 100, and total bili of 2.5.  Currently his symptoms concerning for biliary disease possible choledocholithiasis since  pain is primarily in the epigastric region.  Limited abdominal ultrasound ordered.  Since patient has evidence of transaminitis, I will obtain hepatitis panel as well as Tylenol level however no report of significant Tylenol use.          Final Clinical Impression(s) / ED Diagnoses Final diagnoses:  None    Rx / DC Orders ED Discharge Orders     None         Domenic Moras, PA-C 09/12/22 1510    Blanchie Dessert, MD 09/16/22 2114

## 2022-09-12 NOTE — ED Provider Triage Note (Signed)
Emergency Medicine Provider Triage Evaluation Note  Cathy Robbins , a 67 y.o. female  was evaluated in triage.  Pt complains of abd pain. Intermittent upper abd pain ongoing x 4 months, worse in the past few days.  No fever, cp, sob, n/v/d, dysuria.  Hx of DM  Review of Systems  Positive: As above Negative: As above  Physical Exam  BP (!) 144/61 (BP Location: Left Arm)   Pulse (!) 54   Temp 97.8 F (36.6 C) (Oral)   Resp 18   SpO2 97%  Gen:   Awake, no distress   Resp:  Normal effort  MSK:   Moves extremities without difficulty  Other:  Pale in appearance  Medical Decision Making  Medically screening exam initiated at 11:03 AM.  Appropriate orders placed.  Korin Guard was informed that the remainder of the evaluation will be completed by another provider, this initial triage assessment does not replace that evaluation, and the importance of remaining in the ED until their evaluation is complete.     Domenic Moras, PA-C 09/12/22 1108

## 2022-09-13 ENCOUNTER — Other Ambulatory Visit: Payer: Self-pay

## 2022-09-13 ENCOUNTER — Encounter (HOSPITAL_COMMUNITY): Payer: Self-pay | Admitting: Internal Medicine

## 2022-09-13 ENCOUNTER — Observation Stay (HOSPITAL_COMMUNITY): Payer: Medicare Other

## 2022-09-13 DIAGNOSIS — R1011 Right upper quadrant pain: Secondary | ICD-10-CM | POA: Diagnosis not present

## 2022-09-13 DIAGNOSIS — E1169 Type 2 diabetes mellitus with other specified complication: Secondary | ICD-10-CM | POA: Diagnosis present

## 2022-09-13 DIAGNOSIS — R1013 Epigastric pain: Secondary | ICD-10-CM | POA: Diagnosis not present

## 2022-09-13 DIAGNOSIS — K8066 Calculus of gallbladder and bile duct with acute and chronic cholecystitis without obstruction: Secondary | ICD-10-CM | POA: Diagnosis present

## 2022-09-13 DIAGNOSIS — Z9049 Acquired absence of other specified parts of digestive tract: Secondary | ICD-10-CM | POA: Diagnosis not present

## 2022-09-13 DIAGNOSIS — G8929 Other chronic pain: Secondary | ICD-10-CM

## 2022-09-13 DIAGNOSIS — I129 Hypertensive chronic kidney disease with stage 1 through stage 4 chronic kidney disease, or unspecified chronic kidney disease: Secondary | ICD-10-CM | POA: Diagnosis present

## 2022-09-13 DIAGNOSIS — E785 Hyperlipidemia, unspecified: Secondary | ICD-10-CM | POA: Diagnosis present

## 2022-09-13 DIAGNOSIS — K8000 Calculus of gallbladder with acute cholecystitis without obstruction: Secondary | ICD-10-CM | POA: Diagnosis not present

## 2022-09-13 DIAGNOSIS — E1165 Type 2 diabetes mellitus with hyperglycemia: Secondary | ICD-10-CM | POA: Diagnosis present

## 2022-09-13 DIAGNOSIS — K810 Acute cholecystitis: Secondary | ICD-10-CM | POA: Diagnosis not present

## 2022-09-13 DIAGNOSIS — R7989 Other specified abnormal findings of blood chemistry: Secondary | ICD-10-CM | POA: Diagnosis present

## 2022-09-13 DIAGNOSIS — Z8616 Personal history of COVID-19: Secondary | ICD-10-CM | POA: Diagnosis not present

## 2022-09-13 DIAGNOSIS — K838 Other specified diseases of biliary tract: Secondary | ICD-10-CM | POA: Diagnosis not present

## 2022-09-13 DIAGNOSIS — D649 Anemia, unspecified: Secondary | ICD-10-CM | POA: Diagnosis not present

## 2022-09-13 DIAGNOSIS — Z79899 Other long term (current) drug therapy: Secondary | ICD-10-CM | POA: Diagnosis not present

## 2022-09-13 DIAGNOSIS — N1831 Chronic kidney disease, stage 3a: Secondary | ICD-10-CM | POA: Diagnosis present

## 2022-09-13 DIAGNOSIS — I10 Essential (primary) hypertension: Secondary | ICD-10-CM | POA: Diagnosis not present

## 2022-09-13 DIAGNOSIS — D509 Iron deficiency anemia, unspecified: Secondary | ICD-10-CM | POA: Diagnosis present

## 2022-09-13 DIAGNOSIS — E119 Type 2 diabetes mellitus without complications: Secondary | ICD-10-CM | POA: Diagnosis not present

## 2022-09-13 DIAGNOSIS — Z603 Acculturation difficulty: Secondary | ICD-10-CM | POA: Diagnosis present

## 2022-09-13 DIAGNOSIS — E1122 Type 2 diabetes mellitus with diabetic chronic kidney disease: Secondary | ICD-10-CM | POA: Diagnosis present

## 2022-09-13 LAB — CBC
HCT: 32.7 % — ABNORMAL LOW (ref 36.0–46.0)
Hemoglobin: 10.2 g/dL — ABNORMAL LOW (ref 12.0–15.0)
MCH: 23.3 pg — ABNORMAL LOW (ref 26.0–34.0)
MCHC: 31.2 g/dL (ref 30.0–36.0)
MCV: 74.7 fL — ABNORMAL LOW (ref 80.0–100.0)
Platelets: 231 10*3/uL (ref 150–400)
RBC: 4.38 MIL/uL (ref 3.87–5.11)
RDW: 14 % (ref 11.5–15.5)
WBC: 13.8 10*3/uL — ABNORMAL HIGH (ref 4.0–10.5)
nRBC: 0 % (ref 0.0–0.2)

## 2022-09-13 LAB — COMPREHENSIVE METABOLIC PANEL
ALT: 207 U/L — ABNORMAL HIGH (ref 0–44)
AST: 220 U/L — ABNORMAL HIGH (ref 15–41)
Albumin: 3.4 g/dL — ABNORMAL LOW (ref 3.5–5.0)
Alkaline Phosphatase: 80 U/L (ref 38–126)
Anion gap: 9 (ref 5–15)
BUN: 15 mg/dL (ref 8–23)
CO2: 21 mmol/L — ABNORMAL LOW (ref 22–32)
Calcium: 8 mg/dL — ABNORMAL LOW (ref 8.9–10.3)
Chloride: 107 mmol/L (ref 98–111)
Creatinine, Ser: 0.9 mg/dL (ref 0.44–1.00)
GFR, Estimated: >60 mL/min (ref >60)
Glucose, Bld: 81 mg/dL (ref 70–99)
Potassium: 3.7 mmol/L (ref 3.5–5.1)
Sodium: 137 mmol/L (ref 135–145)
Total Bilirubin: 3.6 mg/dL — ABNORMAL HIGH (ref 0.3–1.2)
Total Protein: 6.8 g/dL (ref 6.5–8.1)

## 2022-09-13 LAB — RETICULOCYTES
Immature Retic Fract: 28.9 % — ABNORMAL HIGH (ref 2.3–15.9)
RBC.: 4.42 MIL/uL (ref 3.87–5.11)
Retic Count, Absolute: 118.9 10*3/uL (ref 19.0–186.0)
Retic Ct Pct: 2.7 % (ref 0.4–3.1)

## 2022-09-13 LAB — IRON AND TIBC
Iron: 72 ug/dL (ref 28–170)
Saturation Ratios: 28 % (ref 10.4–31.8)
TIBC: 262 ug/dL (ref 250–450)
UIBC: 190 ug/dL

## 2022-09-13 LAB — GLUCOSE, CAPILLARY
Glucose-Capillary: 83 mg/dL (ref 70–99)
Glucose-Capillary: 94 mg/dL (ref 70–99)
Glucose-Capillary: 99 mg/dL (ref 70–99)
Glucose-Capillary: 71 mg/dL (ref 70–99)
Glucose-Capillary: 379 mg/dL — ABNORMAL HIGH (ref 70–99)
Glucose-Capillary: 137 mg/dL — ABNORMAL HIGH (ref 70–99)

## 2022-09-13 LAB — HEPATITIS PANEL, ACUTE
HCV Ab: NONREACTIVE
Hep A IgM: NONREACTIVE
Hep B C IgM: NONREACTIVE
Hepatitis B Surface Ag: NONREACTIVE

## 2022-09-13 LAB — PROTIME-INR
INR: 1 (ref 0.8–1.2)
Prothrombin Time: 13.4 seconds (ref 11.4–15.2)

## 2022-09-13 LAB — FERRITIN: Ferritin: 692 ng/mL — ABNORMAL HIGH (ref 11–307)

## 2022-09-13 LAB — VITAMIN B12: Vitamin B-12: 381 pg/mL (ref 180–914)

## 2022-09-13 LAB — FOLATE: Folate: 16.7 ng/mL (ref >5.9)

## 2022-09-13 LAB — HIV ANTIBODY (ROUTINE TESTING W REFLEX): HIV Screen 4th Generation wRfx: NONREACTIVE

## 2022-09-13 MED ORDER — TECHNETIUM TC 99M MEBROFENIN IV KIT
7.6000 | PACK | Freq: Once | INTRAVENOUS | Status: AC
  Administered 2022-09-13: 7.6 via INTRAVENOUS

## 2022-09-13 NOTE — Progress Notes (Signed)
Progress Note     Subjective: She denies pain this am. Pain was severe yesterday but has since resolved. Mild abdominal pain has been present intermittently for months with first episode of severe pain yesterday. She again denies any clear aggravating/alleviating symptoms or triggers. She denies n/v. She has issues with constipation at baseline.   Husband, daughter, son, and daughter in law are at bedside this morning  Objective: Vital signs in last 24 hours: Temp:  [97.6 F (36.4 C)-98.4 F (36.9 C)] 98.4 F (36.9 C) (03/15 0551) Pulse Rate:  [54-78] 66 (03/15 0551) Resp:  [16-18] 16 (03/15 0551) BP: (95-152)/(51-64) 119/54 (03/15 0551) SpO2:  [97 %-100 %] 99 % (03/15 0551) Weight:  [49.6 kg] 49.6 kg (03/14 1828) Last BM Date : 09/12/22  Intake/Output from previous day: 03/14 0701 - 03/15 0700 In: 1905.9 [I.V.:871.3; IV Piggyback:1034.7] Out: -  Intake/Output this shift: No intake/output data recorded.  PE: General: pleasant, WD, female who is laying in bed in NAD Lungs:   Respiratory effort nonlabored Abd: soft, NT, ND, +BS, no masses, hernias, or organomegaly MSK: all 4 extremities are symmetrical with no cyanosis, clubbing, or edema. Skin: warm and dry  Psych: A&Ox3 with an appropriate affect.    Lab Results:  Recent Labs    09/12/22 1145 09/13/22 0517  WBC 19.6* 13.8*  HGB 11.3* 10.2*  HCT 37.0 32.7*  PLT 243 231   BMET Recent Labs    09/12/22 1145 09/13/22 0517  NA 136 137  K 3.9 3.7  CL 101 107  CO2 24 21*  GLUCOSE 219* 81  BUN 18 15  CREATININE 0.89 0.90  CALCIUM 8.9 8.0*   PT/INR Recent Labs    09/13/22 0517  LABPROT 13.4  INR 1.0   CMP     Component Value Date/Time   NA 137 09/13/2022 0517   NA 140 01/12/2019 1024   K 3.7 09/13/2022 0517   CL 107 09/13/2022 0517   CO2 21 (L) 09/13/2022 0517   GLUCOSE 81 09/13/2022 0517   BUN 15 09/13/2022 0517   BUN 15 01/12/2019 1024   CREATININE 0.90 09/13/2022 0517   CREATININE 0.71  05/17/2016 0948   CALCIUM 8.0 (L) 09/13/2022 0517   PROT 6.8 09/13/2022 0517   PROT 7.3 01/12/2019 1024   ALBUMIN 3.4 (L) 09/13/2022 0517   ALBUMIN 4.1 01/12/2019 1024   AST 220 (H) 09/13/2022 0517   ALT 207 (H) 09/13/2022 0517   ALKPHOS 80 09/13/2022 0517   BILITOT 3.6 (H) 09/13/2022 0517   BILITOT 0.4 01/12/2019 1024   GFRNONAA >60 09/13/2022 0517   GFRNONAA >89 05/17/2016 0948   GFRAA 75 01/12/2019 1024   GFRAA >89 05/17/2016 0948   Lipase     Component Value Date/Time   LIPASE 42 09/12/2022 1145       Studies/Results: MR ABDOMEN MRCP W WO CONTAST  Result Date: 09/13/2022 CLINICAL DATA:  Jaundice.  Elevated white count and gallstones. EXAM: MRI ABDOMEN WITHOUT AND WITH CONTRAST (INCLUDING MRCP) TECHNIQUE: Multiplanar multisequence MR imaging of the abdomen was performed both before and after the administration of intravenous contrast. Heavily T2-weighted images of the biliary and pancreatic ducts were obtained, and three-dimensional MRCP images were rendered by post processing. CONTRAST:  32mL GADAVIST GADOBUTROL 1 MMOL/ML IV SOLN COMPARISON:  CT AP 09/12/2022 FINDINGS: Lower chest: No acute findings. Hepatobiliary: No mass or other parenchymal abnormality identified. Layering debris/sludge noted within the neck of the gallbladder, image 19/6 and image 14/5. There is diffuse gallbladder wall  edema with mild pericholecystic fluid. Gallbladder wall thickness measures 5 mm. Mild intrahepatic bile duct dilatation. There is fusiform dilatation of the common bile duct which measures up to 1 cm. No signs of choledocholithiasis. Pancreas: There is no main duct dilatation, inflammation or mass identified. Spleen:  Within normal limits in size and appearance. Adrenals/Urinary Tract: Normal adrenal glands. No nephrolithiasis or hydronephrosis. No kidney mass identified. Stomach/Bowel: Visualized portions within the abdomen are unremarkable. Vascular/Lymphatic: Aortic atherosclerosis. No  pathologically enlarged lymph nodes identified. No abdominal aortic aneurysm demonstrated. Other:  There is trace perihepatic ascites. Musculoskeletal: No suspicious bone lesions identified. IMPRESSION: 1. Diffuse gallbladder wall edema and mild pericholecystic fluid. Layering debris versus sludge noted within the dependent portion of the gallbladder. Findings are suspicious for acute acalculous cholecystitis. 2. There is fusiform dilatation of the common bile duct which measures up to 1 cm. No signs of choledocholithiasis or obstructing mass. 3.  Aortic Atherosclerosis (ICD10-I70.0). Electronically Signed   By: Kerby Moors M.D.   On: 09/13/2022 05:18   MR 3D Recon At Scanner  Result Date: 09/13/2022 CLINICAL DATA:  Jaundice.  Elevated white count and gallstones. EXAM: MRI ABDOMEN WITHOUT AND WITH CONTRAST (INCLUDING MRCP) TECHNIQUE: Multiplanar multisequence MR imaging of the abdomen was performed both before and after the administration of intravenous contrast. Heavily T2-weighted images of the biliary and pancreatic ducts were obtained, and three-dimensional MRCP images were rendered by post processing. CONTRAST:  29mL GADAVIST GADOBUTROL 1 MMOL/ML IV SOLN COMPARISON:  CT AP 09/12/2022 FINDINGS: Lower chest: No acute findings. Hepatobiliary: No mass or other parenchymal abnormality identified. Layering debris/sludge noted within the neck of the gallbladder, image 19/6 and image 14/5. There is diffuse gallbladder wall edema with mild pericholecystic fluid. Gallbladder wall thickness measures 5 mm. Mild intrahepatic bile duct dilatation. There is fusiform dilatation of the common bile duct which measures up to 1 cm. No signs of choledocholithiasis. Pancreas: There is no main duct dilatation, inflammation or mass identified. Spleen:  Within normal limits in size and appearance. Adrenals/Urinary Tract: Normal adrenal glands. No nephrolithiasis or hydronephrosis. No kidney mass identified. Stomach/Bowel:  Visualized portions within the abdomen are unremarkable. Vascular/Lymphatic: Aortic atherosclerosis. No pathologically enlarged lymph nodes identified. No abdominal aortic aneurysm demonstrated. Other:  There is trace perihepatic ascites. Musculoskeletal: No suspicious bone lesions identified. IMPRESSION: 1. Diffuse gallbladder wall edema and mild pericholecystic fluid. Layering debris versus sludge noted within the dependent portion of the gallbladder. Findings are suspicious for acute acalculous cholecystitis. 2. There is fusiform dilatation of the common bile duct which measures up to 1 cm. No signs of choledocholithiasis or obstructing mass. 3.  Aortic Atherosclerosis (ICD10-I70.0). Electronically Signed   By: Kerby Moors M.D.   On: 09/13/2022 05:18   CT ABDOMEN PELVIS W CONTRAST  Result Date: 09/12/2022 CLINICAL DATA:  Epigastric pain, intermittent for 4 months EXAM: CT ABDOMEN AND PELVIS WITH CONTRAST TECHNIQUE: Multidetector CT imaging of the abdomen and pelvis was performed using the standard protocol following bolus administration of intravenous contrast. RADIATION DOSE REDUCTION: This exam was performed according to the departmental dose-optimization program which includes automated exposure control, adjustment of the [place] and/or kV according to patient size and/or use of iterative reconstruction technique. CONTRAST:  169mL OMNIPAQUE IOHEXOL 300 MG/ML  SOLN COMPARISON:  09/12/2022 FINDINGS: Lower chest: No acute pleural or parenchymal lung disease. Hepatobiliary: There is moderate gallbladder distension, with small calcified gallstones layering dependently. Borderline gallbladder wall thickening measuring 3-4 mm. No pericholecystic fluid. The liver is unremarkable. Common bile duct is  dilated measuring up to 11 mm in diameter. No intrahepatic duct dilation. No choledocholithiasis. Pancreas: Unremarkable. No pancreatic ductal dilatation or surrounding inflammatory changes. Spleen: Normal in size  without focal abnormality. Adrenals/Urinary Tract: Adrenal glands are unremarkable. Kidneys are normal, without renal calculi, focal lesion, or hydronephrosis. Bladder is unremarkable. Stomach/Bowel: No bowel obstruction or ileus. The appendix is not identified. No bowel wall thickening or inflammatory change. Vascular/Lymphatic: Aortic atherosclerosis with irregular atheromatous plaque through the out the infrarenal abdominal aorta. No evidence of dissection or high-grade stenosis. Prominent atherosclerosis of the bilateral iliac arteries, with estimated 50% stenosis within the right common iliac artery just proximal to the bifurcation. Proximal outflow vessels appear unremarkable. No pathologic adenopathy. Reproductive: Uterus and bilateral adnexa are unremarkable. Other: No free fluid or free intraperitoneal gas. No abdominal wall hernia. Musculoskeletal: No acute or destructive bony lesions. Reconstructed images demonstrate no additional findings. IMPRESSION: 1. Distended gallbladder, with cholelithiasis and mild gallbladder wall thickening as above. Findings are equivocal for acute cholecystitis. If further evaluation is desired, nuclear medicine hepatobiliary scan could be performed. 2. Dilated common bile duct without intrahepatic biliary duct dilation. This is of uncertain etiology, with no evidence of choledocholithiasis or obstructing mass identified. If further evaluation is desired, ERCP or MRCP could be considered. 3.  Aortic Atherosclerosis (ICD10-I70.0). Electronically Signed   By: Randa Ngo M.D.   On: 09/12/2022 16:38   US Abdomen Limited  Result Date: 09/12/2022 CLINICAL DATA:  Abdominal pain EXAM: ULTRASOUND ABDOMEN LIMITED RIGHT UPPER QUADRANT COMPARISON:  None Available. FINDINGS: Gallbladder: Mildly distended gallbladder with layering echogenic nonshadowing material along the gallbladder wall measuring up to 1.7 cm. No shadowing gallstones. Negative sonographic Murphy sign. Minimal  nonspecific wall thickening. Common bile duct: Diameter: 8.5 mm, which is considered mildly dilated. No intrahepatic ductal dilation. No obstructing lesion identified sonographically. Liver: No focal lesion identified. Within normal limits in parenchymal echogenicity. Portal vein is patent on color Doppler imaging with normal direction of blood flow towards the liver. Other: None. IMPRESSION: Mildly dilated common bile duct, recommend correlation with LFTs for any signs of biliary obstruction. The gallbladder is mildly distended with probable layering echogenic sludge, with minimal wall thickening and a negative sonographic Murphy sign. No shadowing gallstones identified. Electronically Signed   By: Maurine Simmering M.D.   On: 09/12/2022 15:13    Anti-infectives: Anti-infectives (From admission, onward)    Start     Dose/Rate Route Frequency Ordered Stop   09/13/22 0200  piperacillin-tazobactam (ZOSYN) IVPB 3.375 g        3.375 g 12.5 mL/hr over 240 Minutes Intravenous Every 8 hours 09/12/22 1627     09/12/22 1630  piperacillin-tazobactam (ZOSYN) IVPB 3.375 g        3.375 g 12.5 mL/hr over 240 Minutes Intravenous  Once 09/12/22 1627 09/12/22 2130        Assessment/Plan Acalculous cholecystitis?  Elevated LFTs  - MRCP negative for choledocholithiasis but with findings concerning for acalculous cholecystitis (wall edema and mild pericholecystic fluid). There is GB sludge on MRCP. CT notes cholelithiasis and GB wall thickening. [place] with sludge. - LFTs remain elevated and T bili up to 3.6 from 2.5 yesterday - her symptoms are somewhat vague regarding cholecystitis. Given all of the lab and imaging findings I do think laparoscopic cholecystectomy should be considered. Continue zosyn for now and will await GI evaluation given worsening transaminitis.   FEN: NPO for GI evaluation  ID: [email or web address] VTE: okay for chemical prophylaxis from surgical standpoint  T2DM HTN anemia  I reviewed ED provider notes,  hospitalist notes, last 24 h vitals and pain scores, last 48 h intake and output, last 24 h labs and trends, and last 24 h imaging results.  Due to language barrier, an interpreter was present during the history-taking and subsequent discussion (and for part of the physical exam) with this patient.    LOS: 0 days   Winferd Humphrey, [facility] Surgery 09/13/2022, 7:29 AM Please see Amion for pager number during day hours 7:00am-4:30pm

## 2022-09-13 NOTE — Progress Notes (Signed)
[facility] PROGRESS NOTE   Cathy Robbins [id number] DOB: 1956/05/05 DOA: 09/12/2022  PCP: Horald Pollen, MD  Brief History/Interval Summary: 67 y.o. female with a past medical history of hyperlipidemia, diabetes mellitus type 2, essential hypertension who presented with complaints of abdominal pain.  Patient has had recurrent upper abdominal pain ongoing for several months with recent worsening.  Patient was found to have elevated transaminases.  Imaging studies raised concern for a biliary process.  Patient was hospitalized for further management.  Consultants: Gastroenterology.  General surgery  Procedures: None yet    Subjective/Interval History: Patient denies any abdominal pain this morning.  Her husband was able to interpret.  No nausea or vomiting.   Assessment/Plan:  Abnormal LFTs/abdominal pain/Abnormal appearing gallbladder Imaging studies raise concern for a calculus cholecystitis.  Bilirubin noted to be more elevated this morning compared to yesterday.  Alkaline phosphatase however is noted to be normal.  MRCP was done which did not show any obvious choledocholithiasis. Await GI input. Patient was seen by general surgery and cholecystectomy is being considered. Hepatitis panel is pending.  Continue to trend LFTs.  Diabetes mellitus type 2, uncontrolled with hyperglycemia HbA1c 8.0 on March 5. Her home medications are currently on hold. Monitor CBGs.  SSI only for now.  Essential hypertension Hydralazine as needed.  Lisinopril on hold for now.  Microcytic anemia No evidence of overt bleeding.  Anemia panel reviewed.  No clear deficiencies identified. Will need further evaluation in the outpatient setting.  DVT Prophylaxis: SCDs for now Code Status: Full code Family Communication: Discussed with patient and her husband Disposition Plan: To be determined  Status is: Observation The patient will require care spanning > 2 midnights and should be  moved to inpatient because: Cholecystitis requiring further testing and possible surgical intervention    Medications: Scheduled:  insulin aspart  0-9 Units Subcutaneous Q4H   pantoprazole (PROTONIX) IV  40 mg Intravenous Q24H   Continuous:  sodium chloride 75 mL/hr at 09/12/22 1753   piperacillin-tazobactam (ZOSYN)  IV 3.375 g (09/13/22 0944)   [id number], hydrALAZINE, HYDROmorphone (DILAUDID) injection, ondansetron **OR** ondansetron (ZOFRAN) IV, oxyCODONE  Antibiotics: Anti-infectives (From admission, onward)    Start     Dose/Rate Route Frequency Ordered Stop   09/13/22 0200  piperacillin-tazobactam (ZOSYN) IVPB 3.375 g        3.375 g 12.5 mL/hr over 240 Minutes Intravenous Every 8 hours 09/12/22 1627     09/12/22 1630  piperacillin-tazobactam (ZOSYN) IVPB 3.375 g        3.375 g 12.5 mL/hr over 240 Minutes Intravenous  Once 09/12/22 1627 09/12/22 2130       Objective:  Vital Signs  Vitals:   09/12/22 1831 09/12/22 2019 09/13/22 0116 09/13/22 0551  BP: 127/61 (!) 116/53 (!) 95/51 (!) 119/54  Pulse: 78 75 63 66  Resp: 16 18 18 16   Temp: 98.4 F (36.9 C) 98.4 F (36.9 C) 97.6 F (36.4 C) 98.4 F (36.9 C)  TempSrc: Oral Oral Oral Oral  SpO2: 99% 99% 99% 99%  Weight:      Height:        Intake/Output Summary (Last 24 hours) at 09/13/2022 0947 Last data filed at 09/13/2022 0800 Gross per 24 hour  Intake 2115.05 ml  Output --  Net 2115.05 ml   Filed Weights   09/12/22 1828  Weight: 49.6 kg    General appearance: Awake alert.  In no distress Resp: Clear to auscultation bilaterally.  Normal effort Cardio: S1-S2 is normal regular.  No S3-S4.  No rubs murmurs or bruit GI: Abdomen is soft.  Nontender nondistended.  Bowel sounds are present normal.  No masses organomegaly Extremities: No edema.  Full range of motion of lower extremities. Neurologic:  No focal neurological deficits.    Lab Results:  Data Reviewed: I have personally reviewed following labs  and reports of the imaging studies  CBC: Recent Labs  Lab 09/12/22 1145 09/13/22 0517  WBC 19.6* 13.8*  HGB 11.3* 10.2*  HCT 37.0 32.7*  MCV 74.0* 74.7*  PLT 243 [id number]    Basic Metabolic Panel: Recent Labs  Lab 09/12/22 1145 09/13/22 0517  NA 136 137  K 3.9 3.7  CL 101 107  CO2 24 21*  GLUCOSE 219* 81  BUN 18 15  CREATININE 0.89 0.90  CALCIUM 8.9 8.0*    GFR: Estimated Creatinine Clearance: 46.8 mL/min (by C-G formula based on SCr of 0.9 mg/dL).  Liver Function Tests: Recent Labs  Lab 09/12/22 1145 09/13/22 0517  AST 204* 220*  ALT 100* 207*  ALKPHOS 76 80  BILITOT 2.5* 3.6*  PROT 8.1 6.8  ALBUMIN 4.1 3.4*    Recent Labs  Lab 09/12/22 1145  LIPASE 42   Coagulation Profile: Recent Labs  Lab 09/13/22 0517  INR 1.0    CBG: Recent Labs  Lab 09/12/22 1714 09/12/22 2018 09/12/22 2315 09/13/22 0337 09/13/22 0732  GLUCAP 112* 116* 152* 83 94    Anemia Panel: Recent Labs    09/13/22 0517 09/13/22 0518  VITAMINB12 381  --   FOLATE  --  16.7  FERRITIN 692*  --   TIBC 262  --   IRON 72  --   RETICCTPCT 2.7  --      Radiology Studies: MR ABDOMEN MRCP W WO CONTAST  Result Date: 09/13/2022 CLINICAL DATA:  Jaundice.  Elevated white count and gallstones. EXAM: MRI ABDOMEN WITHOUT AND WITH CONTRAST (INCLUDING MRCP) TECHNIQUE: Multiplanar multisequence MR imaging of the abdomen was performed both before and after the administration of intravenous contrast. Heavily T2-weighted images of the biliary and pancreatic ducts were obtained, and three-dimensional MRCP images were rendered by post processing. CONTRAST:  28mL GADAVIST GADOBUTROL 1 MMOL/ML IV SOLN COMPARISON:  CT AP 09/12/2022 FINDINGS: Lower chest: No acute findings. Hepatobiliary: No mass or other parenchymal abnormality identified. Layering debris/sludge noted within the neck of the gallbladder, image 19/6 and image 14/5. There is diffuse gallbladder wall edema with mild pericholecystic fluid.  Gallbladder wall thickness measures 5 mm. Mild intrahepatic bile duct dilatation. There is fusiform dilatation of the common bile duct which measures up to 1 cm. No signs of choledocholithiasis. Pancreas: There is no main duct dilatation, inflammation or mass identified. Spleen:  Within normal limits in size and appearance. Adrenals/Urinary Tract: Normal adrenal glands. No nephrolithiasis or hydronephrosis. No kidney mass identified. Stomach/Bowel: Visualized portions within the abdomen are unremarkable. Vascular/Lymphatic: Aortic atherosclerosis. No pathologically enlarged lymph nodes identified. No abdominal aortic aneurysm demonstrated. Other:  There is trace perihepatic ascites. Musculoskeletal: No suspicious bone lesions identified. IMPRESSION: 1. Diffuse gallbladder wall edema and mild pericholecystic fluid. Layering debris versus sludge noted within the dependent portion of the gallbladder. Findings are suspicious for acute acalculous cholecystitis. 2. There is fusiform dilatation of the common bile duct which measures up to 1 cm. No signs of choledocholithiasis or obstructing mass. 3.  Aortic Atherosclerosis (ICD10-I70.0). Electronically Signed   By: Kerby Moors M.D.   On: 09/13/2022 05:18   MR 3D Recon At Scanner  Result Date: 09/13/2022  CLINICAL DATA:  Jaundice.  Elevated white count and gallstones. EXAM: MRI ABDOMEN WITHOUT AND WITH CONTRAST (INCLUDING MRCP) TECHNIQUE: Multiplanar multisequence MR imaging of the abdomen was performed both before and after the administration of intravenous contrast. Heavily T2-weighted images of the biliary and pancreatic ducts were obtained, and three-dimensional MRCP images were rendered by post processing. CONTRAST:  29mL GADAVIST GADOBUTROL 1 MMOL/ML IV SOLN COMPARISON:  CT AP 09/12/2022 FINDINGS: Lower chest: No acute findings. Hepatobiliary: No mass or other parenchymal abnormality identified. Layering debris/sludge noted within the neck of the gallbladder,  image 19/6 and image 14/5. There is diffuse gallbladder wall edema with mild pericholecystic fluid. Gallbladder wall thickness measures 5 mm. Mild intrahepatic bile duct dilatation. There is fusiform dilatation of the common bile duct which measures up to 1 cm. No signs of choledocholithiasis. Pancreas: There is no main duct dilatation, inflammation or mass identified. Spleen:  Within normal limits in size and appearance. Adrenals/Urinary Tract: Normal adrenal glands. No nephrolithiasis or hydronephrosis. No kidney mass identified. Stomach/Bowel: Visualized portions within the abdomen are unremarkable. Vascular/Lymphatic: Aortic atherosclerosis. No pathologically enlarged lymph nodes identified. No abdominal aortic aneurysm demonstrated. Other:  There is trace perihepatic ascites. Musculoskeletal: No suspicious bone lesions identified. IMPRESSION: 1. Diffuse gallbladder wall edema and mild pericholecystic fluid. Layering debris versus sludge noted within the dependent portion of the gallbladder. Findings are suspicious for acute acalculous cholecystitis. 2. There is fusiform dilatation of the common bile duct which measures up to 1 cm. No signs of choledocholithiasis or obstructing mass. 3.  Aortic Atherosclerosis (ICD10-I70.0). Electronically Signed   By: Kerby Moors M.D.   On: 09/13/2022 05:18   CT ABDOMEN PELVIS W CONTRAST  Result Date: 09/12/2022 CLINICAL DATA:  Epigastric pain, intermittent for 4 months EXAM: CT ABDOMEN AND PELVIS WITH CONTRAST TECHNIQUE: Multidetector CT imaging of the abdomen and pelvis was performed using the standard protocol following bolus administration of intravenous contrast. RADIATION DOSE REDUCTION: This exam was performed according to the departmental dose-optimization program which includes automated exposure control, adjustment of the Hilmer and/or kV according to patient size and/or use of iterative reconstruction technique. CONTRAST:  198mL OMNIPAQUE IOHEXOL 300 MG/ML  SOLN  COMPARISON:  09/12/2022 FINDINGS: Lower chest: No acute pleural or parenchymal lung disease. Hepatobiliary: There is moderate gallbladder distension, with small calcified gallstones layering dependently. Borderline gallbladder wall thickening measuring 3-4 mm. No pericholecystic fluid. The liver is unremarkable. Common bile duct is dilated measuring up to 11 mm in diameter. No intrahepatic duct dilation. No choledocholithiasis. Pancreas: Unremarkable. No pancreatic ductal dilatation or surrounding inflammatory changes. Spleen: Normal in size without focal abnormality. Adrenals/Urinary Tract: Adrenal glands are unremarkable. Kidneys are normal, without renal calculi, focal lesion, or hydronephrosis. Bladder is unremarkable. Stomach/Bowel: No bowel obstruction or ileus. The appendix is not identified. No bowel wall thickening or inflammatory change. Vascular/Lymphatic: Aortic atherosclerosis with irregular atheromatous plaque through the out the infrarenal abdominal aorta. No evidence of dissection or high-grade stenosis. Prominent atherosclerosis of the bilateral iliac arteries, with estimated 50% stenosis within the right common iliac artery just proximal to the bifurcation. Proximal outflow vessels appear unremarkable. No pathologic adenopathy. Reproductive: Uterus and bilateral adnexa are unremarkable. Other: No free fluid or free intraperitoneal gas. No abdominal wall hernia. Musculoskeletal: No acute or destructive bony lesions. Reconstructed images demonstrate no additional findings. IMPRESSION: 1. Distended gallbladder, with cholelithiasis and mild gallbladder wall thickening as above. Findings are equivocal for acute cholecystitis. If further evaluation is desired, nuclear medicine hepatobiliary scan could be performed. 2. Dilated common  bile duct without intrahepatic biliary duct dilation. This is of uncertain etiology, with no evidence of choledocholithiasis or obstructing mass identified. If further  evaluation is desired, ERCP or MRCP could be considered. 3.  Aortic Atherosclerosis (ICD10-I70.0). Electronically Signed   By: Randa Ngo M.D.   On: 09/12/2022 16:38   US Abdomen Limited  Result Date: 09/12/2022 CLINICAL DATA:  Abdominal pain EXAM: ULTRASOUND ABDOMEN LIMITED RIGHT UPPER QUADRANT COMPARISON:  None Available. FINDINGS: Gallbladder: Mildly distended gallbladder with layering echogenic nonshadowing material along the gallbladder wall measuring up to 1.7 cm. No shadowing gallstones. Negative sonographic Murphy sign. Minimal nonspecific wall thickening. Common bile duct: Diameter: 8.5 mm, which is considered mildly dilated. No intrahepatic ductal dilation. No obstructing lesion identified sonographically. Liver: No focal lesion identified. Within normal limits in parenchymal echogenicity. Portal vein is patent on color Doppler imaging with normal direction of blood flow towards the liver. Other: None. IMPRESSION: Mildly dilated common bile duct, recommend correlation with LFTs for any signs of biliary obstruction. The gallbladder is mildly distended with probable layering echogenic sludge, with minimal wall thickening and a negative sonographic Murphy sign. No shadowing gallstones identified. Electronically Signed   By: Maurine Simmering M.D.   On: 09/12/2022 15:13       LOS: 0 days   [facility] Pager on [email or web address]  09/13/2022, 9:47 AM

## 2022-09-13 NOTE — TOC Progression Note (Signed)
Transition of Care [facility]) - Progression Note    Patient Details  Name: Cathy Robbins MRN: [id number] Date of Birth: 31-Aug-1955  Transition of Care [facility]) CM/SW Contact  Servando Snare, [place] Phone Number: 09/13/2022, 10:09 AM  Clinical Narrative:    Transition of Care (TOC) Screening Note   Patient Details  Name: Cathy Robbins Date of Birth: 05-10-1956   Transition of Care [facility]) CM/SW Contact:    Servando Snare, LCSW Phone Number: 09/13/2022, 10:09 AM    Transition of Care Department [facility]) has reviewed patient and no TOC needs have been identified at this time. We will continue to monitor patient advancement through interdisciplinary progression rounds. If new patient transition needs arise, please place a TOC consult.           Expected Discharge Plan and Services                                               Social Determinants of Health (SDOH) Interventions SDOH Screenings   Food Insecurity: No Food Insecurity (09/13/2022)  Housing: Low Risk  (09/13/2022)  Transportation Needs: No Transportation Needs (09/13/2022)  Utilities: Not At Risk (09/13/2022)  Alcohol Screen: Low Risk  (01/12/2019)  Depression (PHQ2-9): Low Risk  (09/03/2022)  Financial Resource Strain: Low Risk  (01/12/2019)  Physical Activity: Sufficiently Active (01/12/2019)  Social Connections: Moderately Isolated (01/12/2019)  Stress: No Stress Concern Present (01/12/2019)  Tobacco Use: Low Risk  (09/13/2022)    Readmission Risk Interventions     No data to display

## 2022-09-13 NOTE — Consult Note (Addendum)
Consultation Note   Referring Provider:  [facility] PCP: Horald Pollen, MD Primary Gastroenterologist: unassigned        Reason for consultation: abdominal pain, abnormal liver tests  Hospital Day: 2   ASSESSMENT   67 yo [place] female ( non-English speaking) with the following:   Abdominal pain / leukocytosis / abnormal liver chemistries / MRCP with suggestion of acute acalculous cholecystitis. No evidence for choledocholithiasis. Possible choledochal cyst.    Microcytic anemia.  No overt GI bleeding. Ferritin elevated as acute phase reactant. Iron studies do not suggest iron deficiency.    PLAN  -General Surgery is following. Will wait to get their thoughts about MRCP findings -Eventual outpatient anemia workup with colonoscopy and EGD    History of Present Illness   Patient is a 67 y.o. year old [place] female with a past medical history of  diabetes, hypertension, chronic kidney disease, hyperlipidemia    See PMH for any additional medical problems.  ** This visit was carried out using family as Astronomer. Some of the family members in room are fluent in [place]. Patient's dialect is not available on video translator.   Patient presented to ED 3/14 with abdominal pain . Family tells me the epigastric pain is non-radiating. It is intermittent without any particular aggravating or alleviating factors. Pain started 4 months ago. No associated N/V. No weight loss. She doesn't take NSAIDs. No blood in stool or black stool.  No lower GI complaints  Labs / imaging  WBC 19.6,  Hgb 11.3, MCV 74. AST 204, ALT 100, and total bili 2.5.  Normal alkaline phos.  INR 1.0. Lipase 42.  US showed mild biliary duct dilation, mildly distended gb. She was started on Zosyn, MRCP ordered and General Surgery consulted.   RUQ US IMPRESSION: Mildly dilated common bile duct, recommend correlation with LFTs for any signs of biliary  obstruction. The gallbladder is mildly distended with probable layering echogenic sludge, with minimal wall thickening and a negative sonographic Murphy sign. No shadowing gallstones identified.  MRCP IMPRESSION: 1. Diffuse gallbladder wall edema and mild pericholecystic fluid. Layering debris versus sludge noted within the dependent portion of the gallbladder. Findings are suspicious for acute acalculous cholecystitis. 2. There is fusiform dilatation of the common bile duct which measures up to 1 cm. No signs of choledocholithiasis or obstructing mass. 3.  Aortic Atherosclerosis (ICD10-I70.0  Interval History : Afebrile. WBC improved to 13.8. Slight worsening in liver tests overnight. AST  204 >> 220. ALT 100 >> 207. Tbili up to 3.6.   Seen by General Surgery last night. They were waiting on MRCP results  Previous GI History / Evaluation :   none     Recent Labs    09/12/22 1145 09/13/22 0517  WBC 19.6* 13.8*  HGB 11.3* 10.2*  HCT 37.0 32.7*  PLT 243 231   Recent Labs    09/12/22 1145 09/13/22 0517  NA 136 137  K 3.9 3.7  CL 101 107  CO2 24 21*  GLUCOSE 219* 81  BUN 18 15  CREATININE 0.89 0.90  CALCIUM 8.9 8.0*   Recent Labs    09/13/22 0517  PROT 6.8  ALBUMIN 3.4*  AST 220*  ALT 207*  ALKPHOS 80  BILITOT 3.6*   Recent Labs    09/12/22 1440  HEPBSAG NON REACTIVE  HCVAB NON REACTIVE  HEPAIGM NON REACTIVE  HEPBIGM NON REACTIVE   Recent Labs    09/13/22 0517  LABPROT 13.4  INR 1.0    Past Medical History:  Diagnosis Date   Chronic pain of left lower extremity 07/14/2019   Diabetes mellitus without complication ([place])    History of COVID-19 09/12/2022   Hyperlipidemia 11/20/2015   Hypertension associated with diabetes ([place]) 01/03/2022   Stage 3a chronic kidney disease ([place]) 06/04/2022    History reviewed. No pertinent surgical history.  History reviewed. No pertinent family history.  Prior to Admission medications   Medication Sig  Start Date End Date Taking? Authorizing Provider  atorvastatin (LIPITOR) 40 MG tablet Take 1 tablet (40 mg total) by mouth daily at 6 PM. 01/29/22   Sagardia, Ines Bloomer, MD  Blood Glucose Calibration (CONTOUR NEXT CONTROL) Normal SOLN 1 drop by In Vitro route as needed. 12/19/17   McVey, Gelene Mink, PA-C  dapagliflozin propanediol (FARXIGA) 10 MG TABS tablet Take 1 tablet (10 mg total) by mouth daily before breakfast. 02/26/22 02/21/23  Horald Pollen, MD  gabapentin (NEURONTIN) 100 MG capsule Take 1 capsule (100 mg total) by mouth 3 (three) times daily. Patient not taking: Reported on 09/03/2022 04/18/22   Horald Pollen, MD  glipiZIDE (GLUCOTROL) 5 MG tablet Take 1 tablet (5 mg total) by mouth 2 (two) times daily before a meal. 06/04/22 05/30/23  Horald Pollen, MD  lisinopril (ZESTRIL) 20 MG tablet Take 1 tablet (20 mg total) by mouth daily. 01/29/22   Horald Pollen, MD    Current Facility-Administered Medications  Medication Dose Route Frequency Provider Last Rate Last Admin   0.9 %  sodium chloride infusion   Intravenous Continuous Bonnielee Haff, MD 75 mL/hr at 09/12/22 1753 New Bag at 09/12/22 1753   albuterol (PROVENTIL) (2.5 MG/3ML) 0.083% nebulizer solution 2.5 mg  2.5 mg Nebulization Q2H PRN Bonnielee Haff, MD       hydrALAZINE (APRESOLINE) injection 5 mg  5 mg Intravenous Q6H PRN Bonnielee Haff, MD       HYDROmorphone (DILAUDID) injection 0.5-1 mg  0.5-1 mg Intravenous Q2H PRN Bonnielee Haff, MD       insulin aspart (novoLOG) injection 0-9 Units  0-9 Units Subcutaneous Q4H Bonnielee Haff, MD   2 Units at 09/12/22 2349   ondansetron (ZOFRAN) tablet 4 mg  4 mg Oral Q6H PRN Bonnielee Haff, MD       Or   ondansetron [facility]) injection 4 mg  4 mg Intravenous Q6H PRN Bonnielee Haff, MD       oxyCODONE (Oxy IR/ROXICODONE) immediate release tablet 5 mg  5 mg Oral Q4H PRN Bonnielee Haff, MD       pantoprazole (PROTONIX) injection 40 mg  40 mg Intravenous  Q24H Bonnielee Haff, MD   40 mg at 09/12/22 2213   piperacillin-tazobactam (ZOSYN) IVPB 3.375 g  3.375 g Intravenous Q8H Adrian Saran, RPH 12.5 mL/hr at 09/13/22 0259 3.375 g at 09/13/22 0259    Allergies as of 09/12/2022   (No Known Allergies)    Social History   Socioeconomic History   Marital status: Married    Spouse name: Not on file   Number of children: 4   Years of education: Not on file   Highest education level: Not on file  Occupational History   Not on file  Tobacco Use   Smoking status: Never  Smokeless tobacco: Never  Vaping Use   Vaping Use: Never used  Substance and Sexual Activity   Alcohol use: No   Drug use: No   Sexual activity: Not Currently  Other Topics Concern   Not on file  Social History Narrative   Not on file   Social Determinants of Health   Financial Resource Strain: Low Risk  (01/12/2019)   Overall Financial Resource Strain (CARDIA)    Difficulty of Paying Living Expenses: Not hard at all  Food Insecurity: No Food Insecurity (09/13/2022)   Hunger Vital Sign    Worried About Running Out of Food in the Last Year: Never true    Ran Out of Food in the Last Year: Never true  Transportation Needs: No Transportation Needs (09/13/2022)   PRAPARE - Hydrologist (Medical): No    Lack of Transportation (Non-Medical): No  Physical Activity: Sufficiently Active (01/12/2019)   Exercise Vital Sign    Days of Exercise per Week: 5 days    Minutes of Exercise per Session: 30 min  Stress: No Stress Concern Present (01/12/2019)   [place]    Feeling of Stress : Not at all  Social Connections: Moderately Isolated (01/12/2019)   Social Connection and Isolation Panel [NHANES]    Frequency of Communication with Friends and Family: Twice a week    Frequency of Social Gatherings with Friends and Family: Once a week    Attends Religious Services: Never    Building surveyor or Organizations: No    Attends Archivist Meetings: Never    Marital Status: Married  Human resources officer Violence: Not At Risk (09/13/2022)   Humiliation, Afraid, Rape, and Kick questionnaire    Fear of Current or Ex-Partner: No    Emotionally Abused: No    Physically Abused: No    Sexually Abused: No    Review of Systems: All systems reviewed and negative except where noted in HPI.  Physical Exam: Vital signs in last 24 hours: Temp:  [97.6 F (36.4 C)-98.4 F (36.9 C)] 98.4 F (36.9 C) (03/15 0551) Pulse Rate:  [54-78] 66 (03/15 0551) Resp:  [16-18] 16 (03/15 0551) BP: (95-152)/(51-64) 119/54 (03/15 0551) SpO2:  [97 %-100 %] 99 % (03/15 0551) Weight:  [49.6 kg] 49.6 kg (03/14 1828) Last BM Date : 09/12/22  General:  Alert female in NAD Psych:  Pleasant, cooperative. Normal mood and affect Eyes: Pupils equal Ears:  Normal auditory acuity Nose: No deformity, discharge or lesions Neck:  Supple, no masses felt Lungs:  Clear to auscultation.  Heart:  Regular rate, regular rhythm.  Abdomen:  Soft, nondistended, nontender, active bowel sounds, no masses felt Rectal :  Deferred Msk: Symmetrical without gross deformities.  Neurologic:  Alert, oriented, grossly normal neurologically Extremities : No edema Skin:  Intact without significant lesions.    Intake/Output from previous day: 03/14 0701 - 03/15 0700 In: 1905.9 [I.V.:871.3; IV Piggyback:1034.7] Out: -  Intake/Output this shift:  Total I/O In: 209.1 [I.V.:187.5; IV Piggyback:21.6] Out: -     Principal Problem:   Abdominal pain Active Problems:   Hyperlipidemia   Uncontrolled type 2 diabetes mellitus with hyperglycemia (HCC)   Hypertension associated with diabetes ([place])   Stage 3a chronic kidney disease (HCC)   Transaminitis   Cholelithiasis   Elevated LFTs   Non-English speaking patient ([place]namese)   Anemia    Tye Savoy, NP-C @  09/13/2022, 8:51 AM  I  have taken an  interval history, thoroughly reviewed the chart and examined the patient. I agree with the Advanced Practitioner's note, impression and recommendations, and have recorded additional findings, impressions and recommendations below. I performed a substantive portion of this encounter (>50% time spent), including a complete performance of the medical decision making.  My additional thoughts are as follows:  I believe this patient has acute cholecystitis causing elevated LFTs from the adjacent hepatic inflammation.  No evidence choledocholithiasis on MRCP.  Normal alkaline phosphatase also speaks against these liver labs being from obstructive cause.   Reported diffuse CBD dilatation to 10 mm, which could be consistent with a choledochocele.  This imaging finding can be evaluated at a later time by our advanced biliary endoscopist to see if it requires outpatient follow-up.  HIDA scan pending, surgical consultants have seen this patient.  Currently on Zosyn.  No current plans for ERCP.  Recommend lap cholecystectomy with IOC (if not precluded by degree of gallbladder inflammation)   Nelida Meuse III [phone]

## 2022-09-13 NOTE — Progress Notes (Signed)
Patient's blood sugar was 71 at 1615. Orange Juice given and patient's tray ordered

## 2022-09-13 NOTE — H&P (View-Only) (Signed)
Progress Note     Subjective: She denies pain this am. Pain was severe yesterday but has since resolved. Mild abdominal pain has been present intermittently for months with first episode of severe pain yesterday. She again denies any clear aggravating/alleviating symptoms or triggers. She denies n/v. She has issues with constipation at baseline.   Husband, daughter, son, and daughter in law are at bedside this morning  Objective: Vital signs in last 24 hours: Temp:  [97.6 F (36.4 C)-98.4 F (36.9 C)] 98.4 F (36.9 C) (03/15 0551) Pulse Rate:  [54-78] 66 (03/15 0551) Resp:  [16-18] 16 (03/15 0551) BP: (95-152)/(51-64) 119/54 (03/15 0551) SpO2:  [97 %-100 %] 99 % (03/15 0551) Weight:  [49.6 kg] 49.6 kg (03/14 1828) Last BM Date : 09/12/22  Intake/Output from previous day: 03/14 0701 - 03/15 0700 In: 1905.9 [I.V.:871.3; IV Piggyback:1034.7] Out: -  Intake/Output this shift: No intake/output data recorded.  PE: General: pleasant, WD, female who is laying in bed in NAD Lungs:   Respiratory effort nonlabored Abd: soft, NT, ND, +BS, no masses, hernias, or organomegaly MSK: all 4 extremities are symmetrical with no cyanosis, clubbing, or edema. Skin: warm and dry  Psych: A&Ox3 with an appropriate affect.    Lab Results:  Recent Labs    09/12/22 1145 09/13/22 0517  WBC 19.6* 13.8*  HGB 11.3* 10.2*  HCT 37.0 32.7*  PLT 243 231   BMET Recent Labs    09/12/22 1145 09/13/22 0517  NA 136 137  K 3.9 3.7  CL 101 107  CO2 24 21*  GLUCOSE 219* 81  BUN 18 15  CREATININE 0.89 0.90  CALCIUM 8.9 8.0*   PT/INR Recent Labs    09/13/22 0517  LABPROT 13.4  INR 1.0   CMP     Component Value Date/Time   NA 137 09/13/2022 0517   NA 140 01/12/2019 1024   K 3.7 09/13/2022 0517   CL 107 09/13/2022 0517   CO2 21 (L) 09/13/2022 0517   GLUCOSE 81 09/13/2022 0517   BUN 15 09/13/2022 0517   BUN 15 01/12/2019 1024   CREATININE 0.90 09/13/2022 0517   CREATININE 0.71  05/17/2016 0948   CALCIUM 8.0 (L) 09/13/2022 0517   PROT 6.8 09/13/2022 0517   PROT 7.3 01/12/2019 1024   ALBUMIN 3.4 (L) 09/13/2022 0517   ALBUMIN 4.1 01/12/2019 1024   AST 220 (H) 09/13/2022 0517   ALT 207 (H) 09/13/2022 0517   ALKPHOS 80 09/13/2022 0517   BILITOT 3.6 (H) 09/13/2022 0517   BILITOT 0.4 01/12/2019 1024   GFRNONAA >60 09/13/2022 0517   GFRNONAA >89 05/17/2016 0948   GFRAA 75 01/12/2019 1024   GFRAA >89 05/17/2016 0948   Lipase     Component Value Date/Time   LIPASE 42 09/12/2022 1145       Studies/Results: MR ABDOMEN MRCP W WO CONTAST  Result Date: 09/13/2022 CLINICAL DATA:  Jaundice.  Elevated white count and gallstones. EXAM: MRI ABDOMEN WITHOUT AND WITH CONTRAST (INCLUDING MRCP) TECHNIQUE: Multiplanar multisequence MR imaging of the abdomen was performed both before and after the administration of intravenous contrast. Heavily T2-weighted images of the biliary and pancreatic ducts were obtained, and three-dimensional MRCP images were rendered by post processing. CONTRAST:  30mL GADAVIST GADOBUTROL 1 MMOL/ML IV SOLN COMPARISON:  CT AP 09/12/2022 FINDINGS: Lower chest: No acute findings. Hepatobiliary: No mass or other parenchymal abnormality identified. Layering debris/sludge noted within the neck of the gallbladder, image 19/6 and image 14/5. There is diffuse gallbladder wall  edema with mild pericholecystic fluid. Gallbladder wall thickness measures 5 mm. Mild intrahepatic bile duct dilatation. There is fusiform dilatation of the common bile duct which measures up to 1 cm. No signs of choledocholithiasis. Pancreas: There is no main duct dilatation, inflammation or mass identified. Spleen:  Within normal limits in size and appearance. Adrenals/Urinary Tract: Normal adrenal glands. No nephrolithiasis or hydronephrosis. No kidney mass identified. Stomach/Bowel: Visualized portions within the abdomen are unremarkable. Vascular/Lymphatic: Aortic atherosclerosis. No  pathologically enlarged lymph nodes identified. No abdominal aortic aneurysm demonstrated. Other:  There is trace perihepatic ascites. Musculoskeletal: No suspicious bone lesions identified. IMPRESSION: 1. Diffuse gallbladder wall edema and mild pericholecystic fluid. Layering debris versus sludge noted within the dependent portion of the gallbladder. Findings are suspicious for acute acalculous cholecystitis. 2. There is fusiform dilatation of the common bile duct which measures up to 1 cm. No signs of choledocholithiasis or obstructing mass. 3.  Aortic Atherosclerosis (ICD10-I70.0). Electronically Signed   By: Kerby Moors M.D.   On: 09/13/2022 05:18   MR 3D Recon At Scanner  Result Date: 09/13/2022 CLINICAL DATA:  Jaundice.  Elevated white count and gallstones. EXAM: MRI ABDOMEN WITHOUT AND WITH CONTRAST (INCLUDING MRCP) TECHNIQUE: Multiplanar multisequence MR imaging of the abdomen was performed both before and after the administration of intravenous contrast. Heavily T2-weighted images of the biliary and pancreatic ducts were obtained, and three-dimensional MRCP images were rendered by post processing. CONTRAST:  1mL GADAVIST GADOBUTROL 1 MMOL/ML IV SOLN COMPARISON:  CT AP 09/12/2022 FINDINGS: Lower chest: No acute findings. Hepatobiliary: No mass or other parenchymal abnormality identified. Layering debris/sludge noted within the neck of the gallbladder, image 19/6 and image 14/5. There is diffuse gallbladder wall edema with mild pericholecystic fluid. Gallbladder wall thickness measures 5 mm. Mild intrahepatic bile duct dilatation. There is fusiform dilatation of the common bile duct which measures up to 1 cm. No signs of choledocholithiasis. Pancreas: There is no main duct dilatation, inflammation or mass identified. Spleen:  Within normal limits in size and appearance. Adrenals/Urinary Tract: Normal adrenal glands. No nephrolithiasis or hydronephrosis. No kidney mass identified. Stomach/Bowel:  Visualized portions within the abdomen are unremarkable. Vascular/Lymphatic: Aortic atherosclerosis. No pathologically enlarged lymph nodes identified. No abdominal aortic aneurysm demonstrated. Other:  There is trace perihepatic ascites. Musculoskeletal: No suspicious bone lesions identified. IMPRESSION: 1. Diffuse gallbladder wall edema and mild pericholecystic fluid. Layering debris versus sludge noted within the dependent portion of the gallbladder. Findings are suspicious for acute acalculous cholecystitis. 2. There is fusiform dilatation of the common bile duct which measures up to 1 cm. No signs of choledocholithiasis or obstructing mass. 3.  Aortic Atherosclerosis (ICD10-I70.0). Electronically Signed   By: Kerby Moors M.D.   On: 09/13/2022 05:18   CT ABDOMEN PELVIS W CONTRAST  Result Date: 09/12/2022 CLINICAL DATA:  Epigastric pain, intermittent for 4 months EXAM: CT ABDOMEN AND PELVIS WITH CONTRAST TECHNIQUE: Multidetector CT imaging of the abdomen and pelvis was performed using the standard protocol following bolus administration of intravenous contrast. RADIATION DOSE REDUCTION: This exam was performed according to the departmental dose-optimization program which includes automated exposure control, adjustment of the [place] and/or kV according to patient size and/or use of iterative reconstruction technique. CONTRAST:  147mL OMNIPAQUE IOHEXOL 300 MG/ML  SOLN COMPARISON:  09/12/2022 FINDINGS: Lower chest: No acute pleural or parenchymal lung disease. Hepatobiliary: There is moderate gallbladder distension, with small calcified gallstones layering dependently. Borderline gallbladder wall thickening measuring 3-4 mm. No pericholecystic fluid. The liver is unremarkable. Common bile duct is  dilated measuring up to 11 mm in diameter. No intrahepatic duct dilation. No choledocholithiasis. Pancreas: Unremarkable. No pancreatic ductal dilatation or surrounding inflammatory changes. Spleen: Normal in size  without focal abnormality. Adrenals/Urinary Tract: Adrenal glands are unremarkable. Kidneys are normal, without renal calculi, focal lesion, or hydronephrosis. Bladder is unremarkable. Stomach/Bowel: No bowel obstruction or ileus. The appendix is not identified. No bowel wall thickening or inflammatory change. Vascular/Lymphatic: Aortic atherosclerosis with irregular atheromatous plaque through the out the infrarenal abdominal aorta. No evidence of dissection or high-grade stenosis. Prominent atherosclerosis of the bilateral iliac arteries, with estimated 50% stenosis within the right common iliac artery just proximal to the bifurcation. Proximal outflow vessels appear unremarkable. No pathologic adenopathy. Reproductive: Uterus and bilateral adnexa are unremarkable. Other: No free fluid or free intraperitoneal gas. No abdominal wall hernia. Musculoskeletal: No acute or destructive bony lesions. Reconstructed images demonstrate no additional findings. IMPRESSION: 1. Distended gallbladder, with cholelithiasis and mild gallbladder wall thickening as above. Findings are equivocal for acute cholecystitis. If further evaluation is desired, nuclear medicine hepatobiliary scan could be performed. 2. Dilated common bile duct without intrahepatic biliary duct dilation. This is of uncertain etiology, with no evidence of choledocholithiasis or obstructing mass identified. If further evaluation is desired, ERCP or MRCP could be considered. 3.  Aortic Atherosclerosis (ICD10-I70.0). Electronically Signed   By: Randa Ngo M.D.   On: 09/12/2022 16:38   US Abdomen Limited  Result Date: 09/12/2022 CLINICAL DATA:  Abdominal pain EXAM: ULTRASOUND ABDOMEN LIMITED RIGHT UPPER QUADRANT COMPARISON:  None Available. FINDINGS: Gallbladder: Mildly distended gallbladder with layering echogenic nonshadowing material along the gallbladder wall measuring up to 1.7 cm. No shadowing gallstones. Negative sonographic Murphy sign. Minimal  nonspecific wall thickening. Common bile duct: Diameter: 8.5 mm, which is considered mildly dilated. No intrahepatic ductal dilation. No obstructing lesion identified sonographically. Liver: No focal lesion identified. Within normal limits in parenchymal echogenicity. Portal vein is patent on color Doppler imaging with normal direction of blood flow towards the liver. Other: None. IMPRESSION: Mildly dilated common bile duct, recommend correlation with LFTs for any signs of biliary obstruction. The gallbladder is mildly distended with probable layering echogenic sludge, with minimal wall thickening and a negative sonographic Murphy sign. No shadowing gallstones identified. Electronically Signed   By: Maurine Simmering M.D.   On: 09/12/2022 15:13    Anti-infectives: Anti-infectives (From admission, onward)    Start     Dose/Rate Route Frequency Ordered Stop   09/13/22 0200  piperacillin-tazobactam (ZOSYN) IVPB 3.375 g        3.375 g 12.5 mL/hr over 240 Minutes Intravenous Every 8 hours 09/12/22 1627     09/12/22 1630  piperacillin-tazobactam (ZOSYN) IVPB 3.375 g        3.375 g 12.5 mL/hr over 240 Minutes Intravenous  Once 09/12/22 1627 09/12/22 2130        Assessment/Plan Acalculous cholecystitis?  Elevated LFTs  - MRCP negative for choledocholithiasis but with findings concerning for acalculous cholecystitis (wall edema and mild pericholecystic fluid). There is GB sludge on MRCP. CT notes cholelithiasis and GB wall thickening. [place] with sludge. - LFTs remain elevated and T bili up to 3.6 from 2.5 yesterday - her symptoms are somewhat vague regarding cholecystitis. Given all of the lab and imaging findings I do think laparoscopic cholecystectomy should be considered. Continue zosyn for now and will await GI evaluation given worsening transaminitis.   FEN: NPO for GI evaluation  ID: [email or web address] VTE: okay for chemical prophylaxis from surgical standpoint  T2DM HTN anemia  I reviewed ED provider notes,  hospitalist notes, last 24 h vitals and pain scores, last 48 h intake and output, last 24 h labs and trends, and last 24 h imaging results.  Due to language barrier, an interpreter was present during the history-taking and subsequent discussion (and for part of the physical exam) with this patient.    LOS: 0 days   Winferd Humphrey, [facility] 09/13/2022, 7:29 AM Please see Amion for pager number during day hours 7:00am-4:30pm

## 2022-09-13 NOTE — Progress Notes (Signed)
Mobility Specialist - Progress Note   09/13/22 1034  Mobility  Activity Ambulated with assistance in hallway  Level of Assistance Standby assist, set-up cues, supervision of patient - no hands on  Assistive Device Front wheel walker  Distance Ambulated (ft) 230 ft  Activity Response Tolerated well  Mobility Referral Yes  $Mobility charge 1 Mobility   Pt received in bed and agreeable to mobility. C/o IV hurting. Nurse made aware. No other complaints during session. Pt to bed after session with all needs met.    [facility]

## 2022-09-13 NOTE — Progress Notes (Signed)
Patient will have surgery tomorrow NPO @ midnight

## 2022-09-14 ENCOUNTER — Inpatient Hospital Stay (HOSPITAL_COMMUNITY): Payer: Medicare Other | Admitting: Anesthesiology

## 2022-09-14 ENCOUNTER — Other Ambulatory Visit: Payer: Self-pay

## 2022-09-14 ENCOUNTER — Inpatient Hospital Stay (HOSPITAL_COMMUNITY): Payer: Medicare Other

## 2022-09-14 ENCOUNTER — Encounter (HOSPITAL_COMMUNITY)
Admission: EM | Disposition: A | Discharge status: Home / Self Care | Payer: Self-pay | Source: Home / Self Care | Attending: Internal Medicine

## 2022-09-14 ENCOUNTER — Encounter (HOSPITAL_COMMUNITY): Payer: Self-pay | Admitting: Internal Medicine

## 2022-09-14 DIAGNOSIS — K8000 Calculus of gallbladder with acute cholecystitis without obstruction: Secondary | ICD-10-CM

## 2022-09-14 DIAGNOSIS — K810 Acute cholecystitis: Secondary | ICD-10-CM | POA: Diagnosis not present

## 2022-09-14 DIAGNOSIS — E1165 Type 2 diabetes mellitus with hyperglycemia: Secondary | ICD-10-CM | POA: Diagnosis not present

## 2022-09-14 DIAGNOSIS — D649 Anemia, unspecified: Secondary | ICD-10-CM

## 2022-09-14 DIAGNOSIS — R7989 Other specified abnormal findings of blood chemistry: Secondary | ICD-10-CM | POA: Diagnosis not present

## 2022-09-14 DIAGNOSIS — I10 Essential (primary) hypertension: Secondary | ICD-10-CM

## 2022-09-14 DIAGNOSIS — E119 Type 2 diabetes mellitus without complications: Secondary | ICD-10-CM

## 2022-09-14 DIAGNOSIS — Z7984 Long term (current) use of oral hypoglycemic drugs: Secondary | ICD-10-CM

## 2022-09-14 HISTORY — PX: CHOLECYSTECTOMY: SHX55

## 2022-09-14 LAB — GLUCOSE, CAPILLARY
Glucose-Capillary: 143 mg/dL — ABNORMAL HIGH (ref 70–99)
Glucose-Capillary: 165 mg/dL — ABNORMAL HIGH (ref 70–99)
Glucose-Capillary: 223 mg/dL — ABNORMAL HIGH (ref 70–99)
Glucose-Capillary: 213 mg/dL — ABNORMAL HIGH (ref 70–99)
Glucose-Capillary: 165 mg/dL — ABNORMAL HIGH (ref 70–99)
Glucose-Capillary: 145 mg/dL — ABNORMAL HIGH (ref 70–99)

## 2022-09-14 LAB — COMPREHENSIVE METABOLIC PANEL
ALT: 161 U/L — ABNORMAL HIGH (ref 0–44)
AST: 113 U/L — ABNORMAL HIGH (ref 15–41)
Albumin: 3.1 g/dL — ABNORMAL LOW (ref 3.5–5.0)
Alkaline Phosphatase: 93 U/L (ref 38–126)
Anion gap: 9 (ref 5–15)
BUN: 17 mg/dL (ref 8–23)
CO2: 22 mmol/L (ref 22–32)
Calcium: 8.1 mg/dL — ABNORMAL LOW (ref 8.9–10.3)
Chloride: 107 mmol/L (ref 98–111)
Creatinine, Ser: 1.01 mg/dL — ABNORMAL HIGH (ref 0.44–1.00)
GFR, Estimated: >60 mL/min (ref >60)
Glucose, Bld: 139 mg/dL — ABNORMAL HIGH (ref 70–99)
Potassium: 3.9 mmol/L (ref 3.5–5.1)
Sodium: 138 mmol/L (ref 135–145)
Total Bilirubin: 1.2 mg/dL (ref 0.3–1.2)
Total Protein: 6.3 g/dL — ABNORMAL LOW (ref 6.5–8.1)

## 2022-09-14 LAB — CBC
HCT: 31.3 % — ABNORMAL LOW (ref 36.0–46.0)
Hemoglobin: 9.7 g/dL — ABNORMAL LOW (ref 12.0–15.0)
MCH: 23.2 pg — ABNORMAL LOW (ref 26.0–34.0)
MCHC: 31 g/dL (ref 30.0–36.0)
MCV: 74.9 fL — ABNORMAL LOW (ref 80.0–100.0)
Platelets: 218 10*3/uL (ref 150–400)
RBC: 4.18 MIL/uL (ref 3.87–5.11)
RDW: 14.3 % (ref 11.5–15.5)
WBC: 10.4 10*3/uL (ref 4.0–10.5)
nRBC: 0 % (ref 0.0–0.2)

## 2022-09-14 LAB — SURGICAL PCR SCREEN
MRSA, PCR: NEGATIVE
Staphylococcus aureus: NEGATIVE

## 2022-09-14 SURGERY — LAPAROSCOPIC CHOLECYSTECTOMY WITH INTRAOPERATIVE CHOLANGIOGRAM
Anesthesia: General | Site: Abdomen

## 2022-09-14 MED ORDER — LIDOCAINE 2% (20 MG/ML) 5 ML SYRINGE
INTRAMUSCULAR | Status: DC | PRN
  Administered 2022-09-14: 20 mg via INTRAVENOUS
  Administered 2022-09-14: 60 mg via INTRAVENOUS

## 2022-09-14 MED ORDER — DEXAMETHASONE SODIUM PHOSPHATE 10 MG/ML IJ SOLN
INTRAMUSCULAR | Status: AC
  Filled 2022-09-14: qty 1

## 2022-09-14 MED ORDER — ROCURONIUM BROMIDE 10 MG/ML (PF) SYRINGE
PREFILLED_SYRINGE | INTRAVENOUS | Status: AC
  Filled 2022-09-14: qty 10

## 2022-09-14 MED ORDER — SODIUM CHLORIDE 0.9 % IV SOLN
2.0000 g | INTRAVENOUS | Status: AC
  Administered 2022-09-14: 2 g via INTRAVENOUS
  Filled 2022-09-14: qty 2

## 2022-09-14 MED ORDER — INSULIN ASPART 100 UNIT/ML IJ SOLN
INTRAMUSCULAR | Status: AC
  Filled 2022-09-14: qty 1

## 2022-09-14 MED ORDER — PHENYLEPHRINE HCL (PRESSORS) 10 MG/ML IV SOLN
INTRAVENOUS | Status: AC
  Filled 2022-09-14: qty 1

## 2022-09-14 MED ORDER — BUPIVACAINE-EPINEPHRINE 0.25% -1:200000 IJ SOLN
INTRAMUSCULAR | Status: DC | PRN
  Administered 2022-09-14: 15 mL

## 2022-09-14 MED ORDER — BUPIVACAINE-EPINEPHRINE (PF) 0.25% -1:200000 IJ SOLN
INTRAMUSCULAR | Status: AC
  Filled 2022-09-14: qty 30

## 2022-09-14 MED ORDER — MIDAZOLAM HCL 2 MG/2ML IJ SOLN
INTRAMUSCULAR | Status: DC | PRN
  Administered 2022-09-14: 1 mg via INTRAVENOUS

## 2022-09-14 MED ORDER — PHENYLEPHRINE HCL-NACL 20-0.9 MG/250ML-% IV SOLN
INTRAVENOUS | Status: DC | PRN
  Administered 2022-09-14: 25 ug/min via INTRAVENOUS

## 2022-09-14 MED ORDER — LACTATED RINGERS IR SOLN
Status: DC | PRN
  Administered 2022-09-14: 1000 mL

## 2022-09-14 MED ORDER — LACTATED RINGERS IV SOLN: INTRAVENOUS | Status: DC | PRN

## 2022-09-14 MED ORDER — DEXAMETHASONE SODIUM PHOSPHATE 10 MG/ML IJ SOLN
INTRAMUSCULAR | Status: DC | PRN
  Administered 2022-09-14: 5 mg via INTRAVENOUS

## 2022-09-14 MED ORDER — FENTANYL CITRATE PF 50 MCG/ML IJ SOSY: 25.0000 ug | PREFILLED_SYRINGE | INTRAMUSCULAR | Status: DC | PRN

## 2022-09-14 MED ORDER — ACETAMINOPHEN 10 MG/ML IV SOLN: 750.0000 mg | Freq: Once | INTRAVENOUS | Status: DC | PRN

## 2022-09-14 MED ORDER — ROCURONIUM BROMIDE 10 MG/ML (PF) SYRINGE
PREFILLED_SYRINGE | INTRAVENOUS | Status: DC | PRN
  Administered 2022-09-14: 40 mg via INTRAVENOUS

## 2022-09-14 MED ORDER — INSULIN ASPART 100 UNIT/ML IJ SOLN
3.0000 [IU] | Freq: Once | INTRAMUSCULAR | Status: AC
  Administered 2022-09-14: 3 [IU] via SUBCUTANEOUS

## 2022-09-14 MED ORDER — SODIUM CHLORIDE 0.45 % IV SOLN: INTRAVENOUS | Status: AC

## 2022-09-14 MED ORDER — AMISULPRIDE (ANTIEMETIC) 5 MG/2ML IV SOLN: 10.0000 mg | Freq: Once | INTRAVENOUS | Status: DC | PRN

## 2022-09-14 MED ORDER — LIDOCAINE HCL (PF) 2 % IJ SOLN
INTRAMUSCULAR | Status: AC
  Filled 2022-09-14: qty 5

## 2022-09-14 MED ORDER — ONDANSETRON HCL 4 MG/2ML IJ SOLN
INTRAMUSCULAR | Status: DC | PRN
  Administered 2022-09-14: 4 mg via INTRAVENOUS

## 2022-09-14 MED ORDER — FENTANYL CITRATE (PF) 250 MCG/5ML IJ SOLN
INTRAMUSCULAR | Status: AC
  Filled 2022-09-14: qty 5

## 2022-09-14 MED ORDER — SUGAMMADEX SODIUM 200 MG/2ML IV SOLN
INTRAVENOUS | Status: DC | PRN
  Administered 2022-09-14: 200 mg via INTRAVENOUS

## 2022-09-14 MED ORDER — ONDANSETRON HCL 4 MG/2ML IJ SOLN
INTRAMUSCULAR | Status: AC
  Filled 2022-09-14: qty 2

## 2022-09-14 MED ORDER — PROPOFOL 500 MG/50ML IV EMUL
INTRAVENOUS | Status: DC | PRN
  Administered 2022-09-14: 15 ug/kg/min via INTRAVENOUS

## 2022-09-14 MED ORDER — FENTANYL CITRATE (PF) 250 MCG/5ML IJ SOLN
INTRAMUSCULAR | Status: DC | PRN
  Administered 2022-09-14: 50 ug via INTRAVENOUS
  Administered 2022-09-14 (×2): 25 ug via INTRAVENOUS

## 2022-09-14 MED ORDER — CHLORHEXIDINE GLUCONATE CLOTH 2 % EX PADS: 6.0000 | MEDICATED_PAD | Freq: Once | CUTANEOUS | Status: DC

## 2022-09-14 MED ORDER — MIDAZOLAM HCL 2 MG/2ML IJ SOLN
INTRAMUSCULAR | Status: AC
  Filled 2022-09-14: qty 2

## 2022-09-14 MED ORDER — IOPAMIDOL (ISOVUE-300) INJECTION 61%
INTRAVENOUS | Status: DC | PRN
  Administered 2022-09-14: 6 mL

## 2022-09-14 MED ORDER — PROPOFOL 10 MG/ML IV BOLUS
INTRAVENOUS | Status: AC
  Filled 2022-09-14: qty 20

## 2022-09-14 MED ORDER — 0.9 % SODIUM CHLORIDE (POUR BTL) OPTIME
TOPICAL | Status: DC | PRN
  Administered 2022-09-14: 1000 mL

## 2022-09-14 MED ORDER — PROPOFOL 10 MG/ML IV BOLUS
INTRAVENOUS | Status: DC | PRN
  Administered 2022-09-14: 100 mg via INTRAVENOUS

## 2022-09-14 MED ORDER — OXYCODONE HCL 5 MG PO TABS
5.0000 mg | ORAL_TABLET | ORAL | Status: DC | PRN
  Filled 2022-09-14: qty 1

## 2022-09-14 MED ORDER — ONDANSETRON HCL 4 MG/2ML IJ SOLN: 4.0000 mg | Freq: Once | INTRAMUSCULAR | Status: DC | PRN

## 2022-09-14 SURGICAL SUPPLY — 34 items
ADH SKN CLS APL DERMABOND .7 (GAUZE/BANDAGES/DRESSINGS) ×1
APL PRP STRL LF DISP 70% ISPRP (MISCELLANEOUS) ×1
APPLIER CLIP ROT 10 11.4 M/L (STAPLE) ×1
APR CLP MED LRG 11.4X10 (STAPLE) ×1
BAG SPEC RTRVL 10 TROC 200 (ENDOMECHANICALS) ×1
CHLORAPREP W/TINT 26 (MISCELLANEOUS) ×2 IMPLANT
CLIP APPLIE ROT 10 11.4 M/L (STAPLE) ×2 IMPLANT
COVER MAYO STAND XLG (MISCELLANEOUS) IMPLANT
DERMABOND ADVANCED .7 DNX12 (GAUZE/BANDAGES/DRESSINGS) ×2 IMPLANT
DRAPE C-ARM 42X120 X-RAY (DRAPES) IMPLANT
ELECT REM PT RETURN 15FT ADLT (MISCELLANEOUS) ×2 IMPLANT
GLOVE INDICATOR 8.0 STRL GRN (GLOVE) ×2 IMPLANT
GLOVE SS BIOGEL STRL SZ 8 (GLOVE) ×2 IMPLANT
GOWN STRL REUS W/ TWL XL LVL3 (GOWN DISPOSABLE) ×4 IMPLANT
GOWN STRL REUS W/TWL XL LVL3 (GOWN DISPOSABLE) ×2
HEMOSTAT SURGICEL 4X8 (HEMOSTASIS) IMPLANT
IRRIG SUCT STRYKERFLOW 2 WTIP (MISCELLANEOUS) ×1
IRRIGATION SUCT STRKRFLW 2 WTP (MISCELLANEOUS) ×2 IMPLANT
KIT BASIN OR (CUSTOM PROCEDURE TRAY) ×2 IMPLANT
KIT TURNOVER KIT A (KITS) IMPLANT
POUCH RETRIEVAL ECOSAC 10 (ENDOMECHANICALS) IMPLANT
POUCH RETRIEVAL ECOSAC 10MM (ENDOMECHANICALS) ×1
SCISSORS LAP 5X35 DISP (ENDOMECHANICALS) ×2 IMPLANT
SET CHOLANGIOGRAPH MIX (MISCELLANEOUS) IMPLANT
SET TUBE SMOKE EVAC HIGH FLOW (TUBING) ×2 IMPLANT
SLEEVE Z-THREAD 5X100MM (TROCAR) ×2 IMPLANT
SPIKE FLUID TRANSFER (MISCELLANEOUS) ×2 IMPLANT
SUT MNCRL AB 4-0 PS2 18 (SUTURE) ×2 IMPLANT
TOWEL OR 17X26 10 PK STRL BLUE (TOWEL DISPOSABLE) ×2 IMPLANT
TOWEL OR NON WOVEN STRL DISP B (DISPOSABLE) ×2 IMPLANT
TRAY LAPAROSCOPIC (CUSTOM PROCEDURE TRAY) ×2 IMPLANT
TROCAR 11X100 Z THREAD (TROCAR) ×2 IMPLANT
TROCAR BALLN 12MMX100 BLUNT (TROCAR) ×2 IMPLANT
TROCAR Z-THREAD OPTICAL 5X100M (TROCAR) ×2 IMPLANT

## 2022-09-14 NOTE — Progress Notes (Signed)
[facility] PROGRESS NOTE   Angelly Tschetter [id number] DOB: [birth year] DOA: 09/12/2022  PCP: Horald Pollen, MD  Brief History/Interval Summary: 67 y.o. female with a past medical history of hyperlipidemia, diabetes mellitus type 2, essential hypertension who presented with complaints of abdominal pain.  Patient has had recurrent upper abdominal pain ongoing for several months with recent worsening.  Patient was found to have elevated transaminases.  Imaging studies raised concern for a biliary process.  Patient was hospitalized for further management.  Consultants: Gastroenterology.  General surgery  Procedures: None yet    Subjective/Interval History: Patient's husband is at the bedside and was able to interpret.  Patient denies any abdominal pain.  No nausea or vomiting overnight.  Waiting on surgery this morning.     Assessment/Plan:  Acute cholecystitis/abnormal LFTs Imaging studies raise concern for a calculus cholecystitis.  Bilirubin was noted to be elevated but alkaline phosphatase was normal.  MRCP did not show any choledocholithiasis. Patient was seen by gastroenterology and general surgery. Patient underwent HIDA scan which showed raised concern for cholecystitis. Bilirubin noted to be normal today. Plan is for cholecystectomy this morning. LFTs are slightly better today. Hepatitis panel is unremarkable. Creatinine noted to be slightly elevated.  Will place her on IV fluids for 24 hours and recheck labs tomorrow.  Diabetes mellitus type 2, uncontrolled with hyperglycemia HbA1c 8.0 on March 5. Her home medications are currently on hold. Monitor CBGs.  SSI only for now.  Essential hypertension Hydralazine as needed.  Lisinopril on hold for now.  Microcytic anemia No evidence of overt bleeding.  Anemia panel reviewed.  No clear deficiencies identified. Will need further evaluation in the outpatient setting.  Plan is for GI evaluation in the outpatient  setting. Slight drop in hemoglobin is noted.  No evidence of overt bleeding.  DVT Prophylaxis: SCDs for now Code Status: Full code Family Communication: Discussed with patient and her husband Disposition Plan: To be determined  Status is: Inpatient Remains inpatient appropriate because: Acute cholecystitis    Medications: Scheduled:  Chlorhexidine Gluconate Cloth  6 each Topical Once   And   Chlorhexidine Gluconate Cloth  6 each Topical Once   [MAR Hold] insulin aspart  0-9 Units Subcutaneous Q4H   [MAR Hold] pantoprazole (PROTONIX) IV  40 mg Intravenous Q24H   Continuous:  sodium chloride     cefoTEtan (CEFOTAN) IV     [MAR Hold] piperacillin-tazobactam (ZOSYN)  IV 3.375 g (09/14/22 0153)   PRN:[MAR Hold] albuterol, [MAR Hold] hydrALAZINE, [MAR Hold]  HYDROmorphone (DILAUDID) injection, [MAR Hold] ondansetron **OR** [MAR Hold] ondansetron (ZOFRAN) IV, [MAR Hold] oxyCODONE  Antibiotics: Anti-infectives (From admission, onward)    Start     Dose/Rate Route Frequency Ordered Stop   09/14/22 0730  cefoTEtan (CEFOTAN) 2 g in sodium chloride 0.9 % 100 mL IVPB        2 g 200 mL/hr over 30 Minutes Intravenous On call to O.R. 09/14/22 [id number] 09/15/22 0559   09/13/22 0200  [MAR Hold]  piperacillin-tazobactam (ZOSYN) IVPB 3.375 g        (MAR Hold since Sat 09/14/2022 at 0730.Hold Reason: Transfer to a Procedural area)   3.375 g 12.5 mL/hr over 240 Minutes Intravenous Every 8 hours 09/12/22 1627     09/12/22 1630  piperacillin-tazobactam (ZOSYN) IVPB 3.375 g        3.375 g 12.5 mL/hr over 240 Minutes Intravenous  Once 09/12/22 1627 09/12/22 2130       Objective:  Vital Signs  Vitals:  09/13/22 1620 09/13/22 2107 09/14/22 0628 09/14/22 0730  BP: (!) 144/61 130/60 123/71 (!) 165/76  Pulse: 67 76 67 76  Resp: 18 16 16 16   Temp: 98.6 F (37 C) 98.7 F (37.1 C) 98.6 F (37 C)   TempSrc: Oral Oral Oral   SpO2: 100% 97% 98%   Weight:      Height:        Intake/Output  Summary (Last 24 hours) at 09/14/2022 0830 Last data filed at 09/14/2022 [id number] Gross per 24 hour  Intake 776.03 ml  Output 0 ml  Net 776.03 ml    Filed Weights   09/12/22 1828  Weight: 49.6 kg    General appearance: Awake alert.  In no distress Resp: Clear to auscultation bilaterally.  Normal effort Cardio: S1-S2 is normal regular.  No S3-S4.  No rubs murmurs or bruit GI: Abdomen is soft.  Nontender nondistended.  Bowel sounds are present normal.  No masses organomegaly Extremities: No edema.  Full range of motion of lower extremities.    Lab Results:  Data Reviewed: I have personally reviewed following labs and reports of the imaging studies  CBC: Recent Labs  Lab 09/12/22 1145 09/13/22 0517 09/14/22 0439  WBC 19.6* 13.8* 10.4  HGB 11.3* 10.2* 9.7*  HCT 37.0 32.7* 31.3*  MCV 74.0* 74.7* 74.9*  PLT 243 231 218     Basic Metabolic Panel: Recent Labs  Lab 09/12/22 1145 09/13/22 0517 09/14/22 0439  NA 136 137 138  K 3.9 3.7 3.9  CL 101 107 107  CO2 24 21* 22  GLUCOSE 219* 81 139*  BUN 18 15 17   CREATININE 0.89 0.90 1.01*  CALCIUM 8.9 8.0* 8.1*     GFR: Estimated Creatinine Clearance: 41.7 mL/min (A) (by C-G formula based on SCr of 1.01 mg/dL (H)).  Liver Function Tests: Recent Labs  Lab 09/12/22 1145 09/13/22 0517 09/14/22 0439  AST 204* 220* 113*  ALT 100* 207* 161*  ALKPHOS 76 80 93  BILITOT 2.5* 3.6* 1.2  PROT 8.1 6.8 6.3*  ALBUMIN 4.1 3.4* 3.1*     Recent Labs  Lab 09/12/22 1145  LIPASE 42    Coagulation Profile: Recent Labs  Lab 09/13/22 0517  INR 1.0     CBG: Recent Labs  Lab 09/13/22 1618 09/13/22 1949 09/13/22 2325 09/14/22 0353 09/14/22 0720  GLUCAP 71 379* 137* 143* 165*     Anemia Panel: Recent Labs    09/13/22 0517 09/13/22 0518  VITAMINB12 381  --   FOLATE  --  16.7  FERRITIN 692*  --   TIBC 262  --   IRON 72  --   RETICCTPCT 2.7  --       Radiology Studies: NM Hepato W/EF  Result Date:  09/13/2022 CLINICAL DATA:  Concern for cholecystitis. EXAM: NUCLEAR MEDICINE HEPATOBILIARY IMAGING TECHNIQUE: Sequential images of the abdomen were obtained out to 60 minutes following intravenous administration of radiopharmaceutical. An additional 30 minutes of scintigraphic imaging was obtained with lateral views. RADIOPHARMACEUTICALS:  7.6 mCi Tc-86m  Choletec IV COMPARISON:  CT MRI and ultrasound dated September 12, 2022. FINDINGS: Prompt uptake and biliary excretion of activity by the liver is seen. Gallbladder activity is not visualized. Biliary activity passes into small bowel, consistent with patent common bile duct. IMPRESSION: Nonvisualization of the gallbladder compatible with acute cholecystitis. Electronically Signed   By: Dahlia Bailiff M.D.   On: 09/13/2022 15:55   MR ABDOMEN MRCP W WO CONTAST  Result Date: 09/13/2022 CLINICAL DATA:  Jaundice.  Elevated white count and gallstones. EXAM: MRI ABDOMEN WITHOUT AND WITH CONTRAST (INCLUDING MRCP) TECHNIQUE: Multiplanar multisequence MR imaging of the abdomen was performed both before and after the administration of intravenous contrast. Heavily T2-weighted images of the biliary and pancreatic ducts were obtained, and three-dimensional MRCP images were rendered by post processing. CONTRAST:  37mL GADAVIST GADOBUTROL 1 MMOL/ML IV SOLN COMPARISON:  CT AP 09/12/2022 FINDINGS: Lower chest: No acute findings. Hepatobiliary: No mass or other parenchymal abnormality identified. Layering debris/sludge noted within the neck of the gallbladder, image 19/6 and image 14/5. There is diffuse gallbladder wall edema with mild pericholecystic fluid. Gallbladder wall thickness measures 5 mm. Mild intrahepatic bile duct dilatation. There is fusiform dilatation of the common bile duct which measures up to 1 cm. No signs of choledocholithiasis. Pancreas: There is no main duct dilatation, inflammation or mass identified. Spleen:  Within normal limits in size and appearance.  Adrenals/Urinary Tract: Normal adrenal glands. No nephrolithiasis or hydronephrosis. No kidney mass identified. Stomach/Bowel: Visualized portions within the abdomen are unremarkable. Vascular/Lymphatic: Aortic atherosclerosis. No pathologically enlarged lymph nodes identified. No abdominal aortic aneurysm demonstrated. Other:  There is trace perihepatic ascites. Musculoskeletal: No suspicious bone lesions identified. IMPRESSION: 1. Diffuse gallbladder wall edema and mild pericholecystic fluid. Layering debris versus sludge noted within the dependent portion of the gallbladder. Findings are suspicious for acute acalculous cholecystitis. 2. There is fusiform dilatation of the common bile duct which measures up to 1 cm. No signs of choledocholithiasis or obstructing mass. 3.  Aortic Atherosclerosis (ICD10-I70.0). Electronically Signed   By: Kerby Moors M.D.   On: 09/13/2022 05:18   MR 3D Recon At Scanner  Result Date: 09/13/2022 CLINICAL DATA:  Jaundice.  Elevated white count and gallstones. EXAM: MRI ABDOMEN WITHOUT AND WITH CONTRAST (INCLUDING MRCP) TECHNIQUE: Multiplanar multisequence MR imaging of the abdomen was performed both before and after the administration of intravenous contrast. Heavily T2-weighted images of the biliary and pancreatic ducts were obtained, and three-dimensional MRCP images were rendered by post processing. CONTRAST:  34mL GADAVIST GADOBUTROL 1 MMOL/ML IV SOLN COMPARISON:  CT AP 09/12/2022 FINDINGS: Lower chest: No acute findings. Hepatobiliary: No mass or other parenchymal abnormality identified. Layering debris/sludge noted within the neck of the gallbladder, image 19/6 and image 14/5. There is diffuse gallbladder wall edema with mild pericholecystic fluid. Gallbladder wall thickness measures 5 mm. Mild intrahepatic bile duct dilatation. There is fusiform dilatation of the common bile duct which measures up to 1 cm. No signs of choledocholithiasis. Pancreas: There is no main duct  dilatation, inflammation or mass identified. Spleen:  Within normal limits in size and appearance. Adrenals/Urinary Tract: Normal adrenal glands. No nephrolithiasis or hydronephrosis. No kidney mass identified. Stomach/Bowel: Visualized portions within the abdomen are unremarkable. Vascular/Lymphatic: Aortic atherosclerosis. No pathologically enlarged lymph nodes identified. No abdominal aortic aneurysm demonstrated. Other:  There is trace perihepatic ascites. Musculoskeletal: No suspicious bone lesions identified. IMPRESSION: 1. Diffuse gallbladder wall edema and mild pericholecystic fluid. Layering debris versus sludge noted within the dependent portion of the gallbladder. Findings are suspicious for acute acalculous cholecystitis. 2. There is fusiform dilatation of the common bile duct which measures up to 1 cm. No signs of choledocholithiasis or obstructing mass. 3.  Aortic Atherosclerosis (ICD10-I70.0). Electronically Signed   By: Kerby Moors M.D.   On: 09/13/2022 05:18   CT ABDOMEN PELVIS W CONTRAST  Result Date: 09/12/2022 CLINICAL DATA:  Epigastric pain, intermittent for 4 months EXAM: CT ABDOMEN AND PELVIS WITH CONTRAST TECHNIQUE: Multidetector CT imaging of the abdomen  and pelvis was performed using the standard protocol following bolus administration of intravenous contrast. RADIATION DOSE REDUCTION: This exam was performed according to the departmental dose-optimization program which includes automated exposure control, adjustment of the Larzelere and/or kV according to patient size and/or use of iterative reconstruction technique. CONTRAST:  116mL OMNIPAQUE IOHEXOL 300 MG/ML  SOLN COMPARISON:  09/12/2022 FINDINGS: Lower chest: No acute pleural or parenchymal lung disease. Hepatobiliary: There is moderate gallbladder distension, with small calcified gallstones layering dependently. Borderline gallbladder wall thickening measuring 3-4 mm. No pericholecystic fluid. The liver is unremarkable. Common bile  duct is dilated measuring up to 11 mm in diameter. No intrahepatic duct dilation. No choledocholithiasis. Pancreas: Unremarkable. No pancreatic ductal dilatation or surrounding inflammatory changes. Spleen: Normal in size without focal abnormality. Adrenals/Urinary Tract: Adrenal glands are unremarkable. Kidneys are normal, without renal calculi, focal lesion, or hydronephrosis. Bladder is unremarkable. Stomach/Bowel: No bowel obstruction or ileus. The appendix is not identified. No bowel wall thickening or inflammatory change. Vascular/Lymphatic: Aortic atherosclerosis with irregular atheromatous plaque through the out the infrarenal abdominal aorta. No evidence of dissection or high-grade stenosis. Prominent atherosclerosis of the bilateral iliac arteries, with estimated 50% stenosis within the right common iliac artery just proximal to the bifurcation. Proximal outflow vessels appear unremarkable. No pathologic adenopathy. Reproductive: Uterus and bilateral adnexa are unremarkable. Other: No free fluid or free intraperitoneal gas. No abdominal wall hernia. Musculoskeletal: No acute or destructive bony lesions. Reconstructed images demonstrate no additional findings. IMPRESSION: 1. Distended gallbladder, with cholelithiasis and mild gallbladder wall thickening as above. Findings are equivocal for acute cholecystitis. If further evaluation is desired, nuclear medicine hepatobiliary scan could be performed. 2. Dilated common bile duct without intrahepatic biliary duct dilation. This is of uncertain etiology, with no evidence of choledocholithiasis or obstructing mass identified. If further evaluation is desired, ERCP or MRCP could be considered. 3.  Aortic Atherosclerosis (ICD10-I70.0). Electronically Signed   By: Randa Ngo M.D.   On: 09/12/2022 16:38   US Abdomen Limited  Result Date: 09/12/2022 CLINICAL DATA:  Abdominal pain EXAM: ULTRASOUND ABDOMEN LIMITED RIGHT UPPER QUADRANT COMPARISON:  None  Available. FINDINGS: Gallbladder: Mildly distended gallbladder with layering echogenic nonshadowing material along the gallbladder wall measuring up to 1.7 cm. No shadowing gallstones. Negative sonographic Murphy sign. Minimal nonspecific wall thickening. Common bile duct: Diameter: 8.5 mm, which is considered mildly dilated. No intrahepatic ductal dilation. No obstructing lesion identified sonographically. Liver: No focal lesion identified. Within normal limits in parenchymal echogenicity. Portal vein is patent on color Doppler imaging with normal direction of blood flow towards the liver. Other: None. IMPRESSION: Mildly dilated common bile duct, recommend correlation with LFTs for any signs of biliary obstruction. The gallbladder is mildly distended with probable layering echogenic sludge, with minimal wall thickening and a negative sonographic Murphy sign. No shadowing gallstones identified. Electronically Signed   By: Maurine Simmering M.D.   On: 09/12/2022 15:13       LOS: 1 day   [facility] Pager on [email or web address]  09/14/2022, 8:30 AM

## 2022-09-14 NOTE — Op Note (Signed)
Laparoscopic Cholecystectomy with IOC Procedure Note  Indications: This patient presents with symptomatic gallbladder disease and will undergo laparoscopic cholecystectomy.  The procedure has been discussed with the patient. Operative and non operative treatments have been discussed. Risks of surgery include bleeding, infection,  Common bile duct injury,  Injury to the stomach,liver, colon,small intestine, abdominal wall,  Diaphragm,  Major blood vessels,  And the need for an open procedure.  Other risks include worsening of medical problems, death,  DVT and pulmonary embolism, and cardiovascular events.   Medical options have also been discussed. The patient has been informed of long term expectations of surgery and non surgical options,  The patient agrees to proceed.   .  Pre-operative Diagnosis: Calculus of gallbladder with acute cholecystitis, without mention of obstruction  Post-operative Diagnosis: Same  Surgeon: Turner Daniels  MD   Assistants: OR staff   Anesthesia: General endotracheal anesthesia and Local anesthesia 0.25.% bupivacaine  ASA Class: 2  Procedure Details  The patient was seen again in the Holding Room. The risks, benefits, complications, treatment options, and expected outcomes were discussed with the patient. The possibilities of reaction to medication, pulmonary aspiration, perforation of viscus, bleeding, recurrent infection, finding a normal gallbladder, the need for additional procedures, failure to diagnose a condition, the possible need to convert to an open procedure, and creating a complication requiring transfusion or operation were discussed with the patient. The patient and/or family concurred with the proposed plan, giving informed consent. The site of surgery properly noted/marked. The patient was taken to Operating Room, identified as Cathy Robbins and the procedure verified as Laparoscopic Cholecystectomy with Intraoperative Cholangiograms. A Time Out was held and  the above information confirmed.  Prior to the induction of general anesthesia, antibiotic prophylaxis was administered. General endotracheal anesthesia was then administered and tolerated well. After the induction, the abdomen was prepped in the usual sterile fashion. The patient was positioned in the supine position with the left arm comfortably tucked, along with some reverse Trendelenburg.  Local anesthetic agent was injected into the skin near the umbilicus and an incision made. The midline fascia was incised and the Hasson technique was used to introduce a 12 mm port under direct vision. It was secured with a figure of eight Vicryl suture placed in the usual fashion. Pneumoperitoneum was then created with CO2 and tolerated well without any adverse changes in the patient's vital signs. Additional trocars were introduced under direct vision with an 11 mm trocar in the epigastrium and 2 5 mm trocars in the right upper quadrant. All skin incisions were infiltrated with a local anesthetic agent before making the incision and placing the trocars.   The gallbladder was identified, the fundus grasped and retracted cephalad. Adhesions were lysed bluntly and with the electrocautery where indicated, taking care not to injure any adjacent organs or viscus. The infundibulum was grasped and retracted laterally, exposing the peritoneum overlying the triangle of Calot. This was then divided and exposed in a blunt fashion. The cystic duct was clearly identified and bluntly dissected circumferentially. The junctions of the gallbladder, cystic duct and common bile duct were clearly identified prior to the division of any linear structure.   An incision was made in the cystic duct and the cholangiogram catheter introduced. The catheter was secured using an endoclip. The study showed no stones and good visualization of the distal and proximal biliary tree. The catheter was then removed.   The cystic duct was then   ligated with surgical clips  on the patient side and  clipped on the gallbladder side and divided. The cystic artery was identified, dissected free, ligated with clips and divided as well. Posterior cystic artery clipped and divided.  The gallbladder was dissected from the liver bed in retrograde fashion with the electrocautery. The gallbladder was removed with EKOS sac. The liver bed was irrigated and inspected. Hemostasis was achieved with the electrocautery. Copious irrigation was utilized and was repeatedly aspirated until clear all particulate matter. Hemostasis was achieved with no signs  Of bleeding or bile leakage.  Pneumoperitoneum was completely reduced after viewing removal of the trocars under direct vision. The wound was thoroughly irrigated and the fascia was then closed with a figure of eight suture; the skin was then closed with 4-0 Monocryl and a sterile dressing of the Dermabond was applied.  Instrument, sponge, and needle counts were correct at closure and at the conclusion of the case.   Findings: Cholecystitis with Cholelithiasis  Estimated Blood Loss: less than 50 mL         Drains: none         Total IV Fluids:per OR record          Specimens: Gallbladder           Complications: None; patient tolerated the procedure well.         Disposition: PACU - hemodynamically stable.         Condition: stable

## 2022-09-14 NOTE — Transfer of Care (Signed)
Immediate Anesthesia Transfer of Care Note  Patient: Cathy Robbins  Procedure(s) Performed: LAPAROSCOPIC CHOLECYSTECTOMY WITH INTRAOPERATIVE CHOLANGIOGRAM (Abdomen)  Patient Location: PACU  Anesthesia Type:General  Level of Consciousness: awake  Airway & Oxygen Therapy: Patient Spontanous Breathing and Patient connected to face mask oxygen  Post-op Assessment: Report given to RN and Post -op Vital signs reviewed and stable  Post vital signs: Reviewed and stable  Last Vitals:  Vitals Value Taken Time  BP 175/78 09/14/22 0945  Temp    Pulse 70 09/14/22 0945  Resp 12 09/14/22 0945  SpO2 99 % 09/14/22 0945  Vitals shown include unvalidated device data.  Last Pain:  Vitals:   09/14/22 0701  TempSrc:   PainSc: 0-No pain         Complications: No notable events documented.

## 2022-09-14 NOTE — Anesthesia Procedure Notes (Signed)
Procedure Name: Intubation Date/Time: 09/14/2022 8:22 AM  Performed by: Cynda Familia, CRNAPre-anesthesia Checklist: Patient identified, Emergency Drugs available, Suction available and Patient being monitored Patient Re-evaluated:Patient Re-evaluated prior to induction Oxygen Delivery Method: Circle system utilized Preoxygenation: Pre-oxygenation with 100% oxygen Induction Type: IV induction Ventilation: Mask ventilation without difficulty Laryngoscope Size: Miller and 2 Tube type: Oral Tube size: 7.0 mm Number of attempts: 1 Airway Equipment and Method: Stylet Placement Confirmation: ETT inserted through vocal cords under direct vision, positive ETCO2 and breath sounds checked- equal and bilateral Secured at: 20 cm Tube secured with: Tape Dental Injury: Teeth and Oropharynx as per pre-operative assessment  Comments: IV induction Ellender- intubation AM CRNA atraumatic-- pt with very poor dentition-- many missing teeth esp upper and many chipped teeth upper right with loose dentition-- remains the same after laryngoscopy - bilat BS Ellender

## 2022-09-14 NOTE — Progress Notes (Signed)
Patient transported to OR via w/c for surgery  accompanied by family member.

## 2022-09-14 NOTE — Anesthesia Procedure Notes (Signed)
Date/Time: 09/14/2022 9:35 AM  Performed by: Cynda Familia, CRNAOxygen Delivery Method: Simple face mask Placement Confirmation: positive ETCO2 and breath sounds checked- equal and bilateral Dental Injury: Teeth and Oropharynx as per pre-operative assessment

## 2022-09-14 NOTE — Progress Notes (Signed)
HIDA scan result noted, patient is currently in OR for cholecystectomy.  Will check back tomorrow.   Wilfrid Lund, MD    [facility]

## 2022-09-14 NOTE — Interval H&P Note (Signed)
History and Physical Interval Note:  09/14/2022 8:11 AM  Song Scharnhorst  has presented today for surgery, with the diagnosis of CHOLECYSTITIS.  The various methods of treatment have been discussed with the patient and family. After consideration of risks, benefits and other options for treatment, the patient has consented to  Procedure(s): LAPAROSCOPIC CHOLECYSTECTOMY WITH INTRAOPERATIVE CHOLANGIOGRAM (N/A) as a surgical intervention.  The patient's history has been reviewed, patient examined, no change in status, stable for surgery.  I have reviewed the patient's chart and labs.  Questions were answered to the patient's satisfaction.     Turner Daniels MD   Discussed care with translator help.  Discussed laparoscopic cholecystectomy.The procedure has been discussed with the patient. Operative and non operative treatments have been discussed. Risks of surgery include bleeding, infection,  Common bile duct injury,  Injury to the stomach,liver, colon,small intestine, abdominal wall,  Diaphragm,  Major blood vessels,  And the need for an open procedure.  Other risks include worsening of medical problems, death,  DVT and pulmonary embolism, and cardiovascular events.   Medical options have also been discussed. The patient has been informed of long term expectations of surgery and non surgical options,  The patient agrees to proceed.

## 2022-09-14 NOTE — Anesthesia Preprocedure Evaluation (Addendum)
Anesthesia Evaluation  Patient identified by MRN, date of birth, ID band Patient awake    Reviewed: Allergy & Precautions, NPO status , Patient's Chart, lab work & pertinent test results  Airway Mallampati: III  TM Distance: >3 FB Neck ROM: Full    Dental  (+) Missing, Loose,    Pulmonary neg pulmonary ROS   Pulmonary exam normal        Cardiovascular hypertension, Pt. on medications Normal cardiovascular exam     Neuro/Psych negative neurological ROS  negative psych ROS   GI/Hepatic negative GI ROS, Neg liver ROS,,,  Endo/Other  diabetes, Oral Hypoglycemic Agents    Renal/GU Renal disease     Musculoskeletal negative musculoskeletal ROS (+)    Abdominal   Peds  Hematology  (+) Blood dyscrasia, anemia   Anesthesia Other Findings CHOLECYSTITIS  Reproductive/Obstetrics                             Anesthesia Physical Anesthesia Plan  ASA: 3  Anesthesia Plan: General   Post-op Pain Management:    Induction: Intravenous  PONV Risk Score and Plan: 4 or greater and Ondansetron, Dexamethasone, Propofol infusion, Treatment may vary due to age or medical condition and Midazolam  Airway Management Planned: Oral ETT  Additional Equipment:   Intra-op Plan:   Post-operative Plan: Extubation in OR  Informed Consent: I have reviewed the patients History and Physical, chart, labs and discussed the procedure including the risks, benefits and alternatives for the proposed anesthesia with the patient or authorized representative who has indicated his/her understanding and acceptance.     Dental advisory given and Interpreter used for interveiw  Plan Discussed with: CRNA  Anesthesia Plan Comments:        Anesthesia Quick Evaluation

## 2022-09-14 NOTE — Anesthesia Postprocedure Evaluation (Signed)
Anesthesia Post Note  Patient: Cathy Robbins  Procedure(s) Performed: LAPAROSCOPIC CHOLECYSTECTOMY WITH INTRAOPERATIVE CHOLANGIOGRAM (Abdomen)     Patient location during evaluation: PACU Anesthesia Type: General Level of consciousness: awake Pain management: pain level controlled Vital Signs Assessment: post-procedure vital signs reviewed and stable Respiratory status: spontaneous breathing, nonlabored ventilation and respiratory function stable Cardiovascular status: blood pressure returned to baseline and stable Postop Assessment: no apparent nausea or vomiting Anesthetic complications: no   No notable events documented.  Last Vitals:  Vitals:   09/14/22 1013 09/14/22 1046  BP: (!) 176/73 (!) 175/69  Pulse: 65 63  Resp: 15 16  Temp:  36.7 C  SpO2: 100% 97%    Last Pain:  Vitals:   09/14/22 1140  TempSrc:   PainSc: 5                  Demaurion Dicioccio P Weber Monnier

## 2022-09-15 ENCOUNTER — Encounter (HOSPITAL_COMMUNITY): Payer: Self-pay | Admitting: Surgery

## 2022-09-15 DIAGNOSIS — D509 Iron deficiency anemia, unspecified: Secondary | ICD-10-CM

## 2022-09-15 DIAGNOSIS — K838 Other specified diseases of biliary tract: Secondary | ICD-10-CM

## 2022-09-15 DIAGNOSIS — R7989 Other specified abnormal findings of blood chemistry: Secondary | ICD-10-CM | POA: Diagnosis not present

## 2022-09-15 DIAGNOSIS — Z9049 Acquired absence of other specified parts of digestive tract: Secondary | ICD-10-CM

## 2022-09-15 DIAGNOSIS — R1013 Epigastric pain: Secondary | ICD-10-CM | POA: Diagnosis not present

## 2022-09-15 DIAGNOSIS — K810 Acute cholecystitis: Secondary | ICD-10-CM | POA: Diagnosis not present

## 2022-09-15 LAB — CBC
HCT: 34.1 % — ABNORMAL LOW (ref 36.0–46.0)
Hemoglobin: 10.7 g/dL — ABNORMAL LOW (ref 12.0–15.0)
MCH: 23 pg — ABNORMAL LOW (ref 26.0–34.0)
MCHC: 31.4 g/dL (ref 30.0–36.0)
MCV: 73.3 fL — ABNORMAL LOW (ref 80.0–100.0)
Platelets: 232 10*3/uL (ref 150–400)
RBC: 4.65 MIL/uL (ref 3.87–5.11)
RDW: 14.2 % (ref 11.5–15.5)
WBC: 13.7 10*3/uL — ABNORMAL HIGH (ref 4.0–10.5)
nRBC: 0 % (ref 0.0–0.2)

## 2022-09-15 LAB — COMPREHENSIVE METABOLIC PANEL
ALT: 145 U/L — ABNORMAL HIGH (ref 0–44)
AST: 71 U/L — ABNORMAL HIGH (ref 15–41)
Albumin: 3.3 g/dL — ABNORMAL LOW (ref 3.5–5.0)
Alkaline Phosphatase: 91 U/L (ref 38–126)
Anion gap: 10 (ref 5–15)
BUN: 15 mg/dL (ref 8–23)
CO2: 20 mmol/L — ABNORMAL LOW (ref 22–32)
Calcium: 8.1 mg/dL — ABNORMAL LOW (ref 8.9–10.3)
Chloride: 105 mmol/L (ref 98–111)
Creatinine, Ser: 0.98 mg/dL (ref 0.44–1.00)
GFR, Estimated: >60 mL/min (ref >60)
Glucose, Bld: 112 mg/dL — ABNORMAL HIGH (ref 70–99)
Potassium: 3.7 mmol/L (ref 3.5–5.1)
Sodium: 135 mmol/L (ref 135–145)
Total Bilirubin: 1.6 mg/dL — ABNORMAL HIGH (ref 0.3–1.2)
Total Protein: 6.8 g/dL (ref 6.5–8.1)

## 2022-09-15 LAB — GLUCOSE, CAPILLARY
Glucose-Capillary: 105 mg/dL — ABNORMAL HIGH (ref 70–99)
Glucose-Capillary: 118 mg/dL — ABNORMAL HIGH (ref 70–99)
Glucose-Capillary: 120 mg/dL — ABNORMAL HIGH (ref 70–99)

## 2022-09-15 MED ORDER — AMOXICILLIN-POT CLAVULANATE 875-125 MG PO TABS: 1.0000 | ORAL_TABLET | Freq: Two times a day (BID) | ORAL | 0 refills | Status: AC

## 2022-09-15 MED ORDER — OXYCODONE HCL 5 MG PO TABS: 5.0000 mg | ORAL_TABLET | ORAL | 0 refills | Status: DC | PRN

## 2022-09-15 NOTE — Progress Notes (Signed)
1 Day Post-Op  Subjective: No acute events overnight, ambulating well.  Tolerating a diet  Objective: Vital signs in last 24 hours: Temp:  [97.8 F (36.6 C)-98.4 F (36.9 C)] 98.1 F (36.7 C) (03/17 0441) Pulse Rate:  [63-78] 69 (03/17 0441) Resp:  [12-18] 16 (03/17 0441) BP: (128-180)/(62-82) 158/67 (03/17 0441) SpO2:  [94 %-100 %] 99 % (03/17 0441)   Intake/Output from previous day: 03/16 0701 - 03/17 0700 In: 2286.6 [P.O.:200; I.V.:1975; IV Piggyback:111.6] Out: 5 [Blood:5] Intake/Output this shift: No intake/output data recorded.   General appearance: alert and cooperative GI: soft, nontender  Incision: no significant drainage  Lab Results:  Recent Labs    09/14/22 0439 09/15/22 0439  WBC 10.4 13.7*  HGB 9.7* 10.7*  HCT 31.3* 34.1*  PLT 218 232   BMET Recent Labs    09/14/22 0439 09/15/22 0439  NA 138 135  K 3.9 3.7  CL 107 105  CO2 22 20*  GLUCOSE 139* 112*  BUN 17 15  CREATININE 1.01* 0.98  CALCIUM 8.1* 8.1*   PT/INR Recent Labs    09/13/22 0517  LABPROT 13.4  INR 1.0   ABG No results for input(s): "PHART", "HCO3" in the last 72 hours.  Invalid input(s): "PCO2", "PO2"  MEDS, Scheduled  insulin aspart  0-9 Units Subcutaneous Q4H    Studies/Results: DG Cholangiogram Operative  Result Date: 09/14/2022 CLINICAL DATA:  Status post cholecystectomy. EXAM: INTRAOPERATIVE CHOLANGIOGRAM TECHNIQUE: Cholangiographic images from the C-arm fluoroscopic device were submitted for interpretation post-operatively. Please see the procedural report for the amount of contrast and the fluoroscopy time utilized. FLUOROSCOPY: Radiation Exposure Index (as provided by the fluoroscopic device): 2.89 mGy Kerma COMPARISON:  MR abdomen 09/12/2022 FINDINGS: Intraoperative cholangiogram was performed following cholecystectomy. Contrast injection opacifies the common bile duct and intrahepatic ducts with spillage into the duodenum. No extravasation of contrast material  identified. No focal filling defects identified within the CBD. IMPRESSION: 1. Status post cholecystectomy. 2. No signs of choledocholithiasis or contrast extravasation to suggest bile leak. Electronically Signed   By: Kerby Moors M.D.   On: 09/14/2022 09:22   NM Hepato W/EF  Result Date: 09/13/2022 CLINICAL DATA:  Concern for cholecystitis. EXAM: NUCLEAR MEDICINE HEPATOBILIARY IMAGING TECHNIQUE: Sequential images of the abdomen were obtained out to 60 minutes following intravenous administration of radiopharmaceutical. An additional 30 minutes of scintigraphic imaging was obtained with lateral views. RADIOPHARMACEUTICALS:  7.6 mCi Tc-41m  Choletec IV COMPARISON:  CT MRI and ultrasound dated September 12, 2022. FINDINGS: Prompt uptake and biliary excretion of activity by the liver is seen. Gallbladder activity is not visualized. Biliary activity passes into small bowel, consistent with patent common bile duct. IMPRESSION: Nonvisualization of the gallbladder compatible with acute cholecystitis. Electronically Signed   By: Dahlia Bailiff M.D.   On: 09/13/2022 15:55    Assessment: s/p Procedure(s): LAPAROSCOPIC CHOLECYSTECTOMY WITH INTRAOPERATIVE CHOLANGIOGRAM Patient Active Problem List   Diagnosis Date Noted   Abnormal LFTs 09/13/2022   Abdominal pain, chronic, right upper quadrant 09/13/2022   Abdominal pain 09/12/2022   Transaminitis 09/12/2022   Cholelithiasis 09/12/2022   Elevated LFTs 09/12/2022   Non-English speaking patient (Vietnamese) 09/12/2022   History of COVID-19 09/12/2022   Anemia 09/12/2022   Stage 3a chronic kidney disease ([place]) 06/04/2022   Uncontrolled type 2 diabetes mellitus with hyperglycemia ([place]) 01/03/2022   Hypertension associated with diabetes ([place]) 01/03/2022   Chronic pain of left lower extremity 07/14/2019   Positive ANA (antinuclear antibody) 07/14/2019   Dyslipidemia associated with type 2  diabetes mellitus ([place]) 11/20/2015   Hyperlipidemia 11/20/2015       Plan:   LOS: 2 days   Ok to d/c  Abx x 5d  F/u in our office in 2 wks Repeat LFT's as an outpatient in the next week  .Rosario Adie, [facility], [place]    09/15/2022 8:18 AM

## 2022-09-15 NOTE — Progress Notes (Signed)
Reviewed written d/c instructions w pt and her daughter and all questions answered. They both verbalized understanding. D/C via w/c w all belongings in stable condition. 

## 2022-09-15 NOTE — Discharge Instructions (Signed)
LAPAROSCOPIC SURGERY: POST OP INSTRUCTIONS  DIET: Follow a light bland diet the first 24 hours after arrival home, such as soup, liquids, crackers, etc.  Be sure to include lots of fluids daily.  Avoid fast food or heavy meals as your are more likely to get nauseated.  Eat a low fat the next few days after surgery.   Take your usually prescribed home medications unless otherwise directed. PAIN CONTROL: Pain is best controlled by a usual combination of three different methods TOGETHER: Ice/Heat Over the counter pain medication Prescription pain medication Most patients will experience some swelling and bruising around the incisions.  Ice packs or heating pads (30-60 minutes up to 6 times a day) will help. Use ice for the first few days to help decrease swelling and bruising, then switch to heat to help relax tight/sore spots and speed recovery.  Some people prefer to use ice alone, heat alone, alternating between ice & heat.  Experiment to what works for you.  Swelling and bruising can take several weeks to resolve.   It is helpful to take an over-the-counter pain medication regularly for the first few weeks.  Choose one of the following that works best for you: Naproxen (Aleve, etc)  Two 220mg tabs twice a day Ibuprofen (Advil, etc) Three 200mg tabs four times a day (every meal & bedtime) A  prescription for pain medication (such as percocet, vicodin, oxycodone, hydrocodone, etc) should be given to you upon discharge.  Take your pain medication as prescribed.  If you are having problems/concerns with the prescription medicine (does not control pain, nausea, vomiting, rash, itching, etc), please call us [phone] to see if we need to switch you to a different pain medicine that will work better for you and/or control your side effect better. If you need a refill on your pain medication, please contact your pharmacy.  They will contact our office to request authorization. Prescriptions will not be  filled after 5 pm or on week-ends.   Avoid getting constipated.  Between the surgery and the pain medications, it is common to experience some constipation.  Increasing fluid intake and taking a fiber supplement (such as Metamucil, Citrucel, FiberCon, MiraLax, etc) 1-2 times a day regularly will usually help prevent this problem from occurring.  A mild laxative (prune juice, Milk of Magnesia, MiraLax, etc) should be taken according to package directions if there are no bowel movements after 48 hours.   Watch out for diarrhea.  If you have many loose bowel movements, simplify your diet to bland foods & liquids for a few days.  Stop any stool softeners and decrease your fiber supplement.  Switching to mild anti-diarrheal medications (Kayopectate, Pepto Bismol) can help.  If this worsens or does not improve, please call us. Wash / shower every day.  You may shower over the dressings as they are waterproof.  Continue to shower over incision(s) after the dressing is off. Remove your waterproof bandages 5 days after surgery.  You may leave the incision open to air.  You may replace a dressing/Band-Aid to cover the incision for comfort if you wish.  ACTIVITIES as tolerated:   You may resume regular (light) daily activities beginning the next day--such as daily self-care, walking, climbing stairs--gradually increasing activities as tolerated.  If you can walk 30 minutes without difficulty, it is safe to try more intense activity such as jogging, treadmill, bicycling, low-impact aerobics, swimming, etc. Save the most intensive and strenuous activity for last such as sit-ups, heavy   lifting, contact sports, etc  Refrain from any heavy lifting or straining until you are off narcotics for pain control.   DO NOT PUSH THROUGH PAIN.  Let pain be your guide: If it hurts to do something, don't do it.  Pain is your body warning you to avoid that activity for another week until the pain goes down. You may drive when you are  no longer taking prescription pain medication, you can comfortably wear a seatbelt, and you can safely maneuver your car and apply brakes. You may have sexual intercourse when it is comfortable.  FOLLOW UP in our office Please call [facility] at [phone] to set up an appointment to see your surgeon in the office for a follow-up appointment approximately 2-3 weeks after your surgery. Make sure that you call for this appointment the day you arrive home to insure a convenient appointment time. 10. IF YOU HAVE DISABILITY OR FAMILY LEAVE FORMS, BRING THEM TO THE OFFICE FOR PROCESSING.  DO NOT GIVE THEM TO YOUR DOCTOR.   WHEN TO CALL US [phone]: Poor pain control Reactions / problems with new medications (rash/itching, nausea, etc)  Fever over 101.5 F (38.5 C) Inability to urinate Nausea and/or vomiting Worsening swelling or bruising Continued bleeding from incision. Increased pain, redness, or drainage from the incision   The clinic staff is available to answer your questions during regular business hours (8:30am-5pm).  Please don't hesitate to call and ask to speak to one of our nurses for clinical concerns.   If you have a medical emergency, go to the nearest emergency room or call 911.  A surgeon from [facility] is always on call at the hospitals   [facility] [address], [address], [place], [place]  [address] ? MAIN: [phone] ? TOLL FREE: [phone] ?  FAX [phone] [email or web address]   

## 2022-09-15 NOTE — Progress Notes (Signed)
Gastroenterology Inpatient Follow-up Note   PATIENT IDENTIFICATION  Akisha Hansard is a 67 y.o. female  SUBJECTIVE  The patient's chart has been reviewed. The patient's labs reviewed. Patient evaluated with patient's daughter at the bedside. She is status post cholecystectomy yesterday and seems to be doing quite well with very minimal amounts of pain or discomfort. Liver biochemical tests are steadily improving with improved bilirubin from admission. We reviewed the patient's IOC which shows no evidence of retained stone and not significantly concerning for choledochocele. Patient's daughter states that she had a Cologuard in 2023 that was negative.   OBJECTIVE  Scheduled Inpatient Medications:   insulin aspart  0-9 Units Subcutaneous Q4H   Continuous Inpatient Infusions:   piperacillin-tazobactam (ZOSYN)  IV 3.375 g (09/15/22 1008)   PRN Inpatient Medications: albuterol, hydrALAZINE, HYDROmorphone (DILAUDID) injection, ondansetron **OR** ondansetron (ZOFRAN) IV, oxyCODONE, oxyCODONE   Physical Examination  Temp:  [97.8 F (36.6 C)-98.4 F (36.9 C)] 98.1 F (36.7 C) (03/17 0441) Pulse Rate:  [63-78] 69 (03/17 0441) Resp:  [14-18] 16 (03/17 0441) BP: (128-176)/(62-82) 158/67 (03/17 0441) SpO2:  [97 %-100 %] 99 % (03/17 0441) Temp (24hrs), Avg:98.1 F (36.7 C), Min:97.8 F (36.6 C), Max:98.4 F (36.9 C)  Weight: 49.6 kg GEN: NAD, appears stated age, doesn't appear chronically ill, daughter at bedside PSYCH: Cooperative, without pressured speech EYE: Conjunctivae pale-pink ENT: MMM CV: Nontachycardic RESP: No audible wheezing GI: NABS, soft, minimal TTP in MEG/RUQ upon deep palpation, surgical port sites noted, no rebound or guarding MSK/EXT: No significant lower extremity edema SKIN: No jaundice NEURO:  Alert & Oriented x 3, no focal deficits   Review of Data   Laboratory Studies   Recent Labs  Lab 09/15/22 0439  NA 135  K 3.7  CL 105  CO2 20*  BUN 15   CREATININE 0.98  GLUCOSE 112*  CALCIUM 8.1*   Recent Labs  Lab 09/15/22 0439  AST 71*  ALT 145*  ALKPHOS 91    Recent Labs  Lab 09/13/22 0517 09/14/22 0439 09/15/22 0439  WBC 13.8* 10.4 13.7*  HGB 10.2* 9.7* 10.7*  HCT 32.7* 31.3* 34.1*  PLT 231 218 232   Recent Labs  Lab 09/13/22 0517  INR 1.0    Imaging Studies  DG Cholangiogram Operative  Result Date: 09/14/2022 CLINICAL DATA:  Status post cholecystectomy. EXAM: INTRAOPERATIVE CHOLANGIOGRAM TECHNIQUE: Cholangiographic images from the C-arm fluoroscopic device were submitted for interpretation post-operatively. Please see the procedural report for the amount of contrast and the fluoroscopy time utilized. FLUOROSCOPY: Radiation Exposure Index (as provided by the fluoroscopic device): 2.89 mGy Kerma COMPARISON:  MR abdomen 09/12/2022 FINDINGS: Intraoperative cholangiogram was performed following cholecystectomy. Contrast injection opacifies the common bile duct and intrahepatic ducts with spillage into the duodenum. No extravasation of contrast material identified. No focal filling defects identified within the CBD. IMPRESSION: 1. Status post cholecystectomy. 2. No signs of choledocholithiasis or contrast extravasation to suggest bile leak. Electronically Signed   By: Kerby Moors M.D.   On: 09/14/2022 09:22   NM Hepato W/EF  Result Date: 09/13/2022 CLINICAL DATA:  Concern for cholecystitis. EXAM: NUCLEAR MEDICINE HEPATOBILIARY IMAGING TECHNIQUE: Sequential images of the abdomen were obtained out to 60 minutes following intravenous administration of radiopharmaceutical. An additional 30 minutes of scintigraphic imaging was obtained with lateral views. RADIOPHARMACEUTICALS:  7.6 mCi Tc-40m  Choletec IV COMPARISON:  CT MRI and ultrasound dated September 12, 2022. FINDINGS: Prompt uptake and biliary excretion of activity by the liver is seen. Gallbladder activity is  not visualized. Biliary activity passes into small bowel, consistent  with patent common bile duct. IMPRESSION: Nonvisualization of the gallbladder compatible with acute cholecystitis. Electronically Signed   By: Dahlia Bailiff M.D.   On: 09/13/2022 15:55    GI Procedures and Studies  No relevant studies to review   ASSESSMENT  Ms. Vanevery is a 67 y.o. female with a pmh significant for HTN, HLD, DM, CRI, s/p CCK, Microcytic anemia.  Now status postcholecystectomy for abnormal LFTs and imaging.  The patient is hemodynamically and clinically stable at this time.  LFTs are on the downtrend from admission.  Patient's cholangiogram was reviewed and does not have overt evidence of choledochocele.  At this point I think it is reasonable for the patient to be managed expectantly.  She will need follow-up in our clinic for evaluation of her microcytic anemia and reevaluation of her iron indices when she does not have acute inflammation.  She did have a Cologuard that was negative in 2023, but if we find that she has true evidence of iron deficiency on follow-up, then she will benefit from a colonoscopy and upper endoscopy to rule out other sources of microcytic iron deficiency.  We will work on arranging a follow-up in clinic in 6 to 8 weeks so that she may have time to heal from her cholecystectomy.  I did go over the possibility that bowel habits could change after cholecystectomy and they will be on the look out for that.   PLAN/RECOMMENDATIONS  Plan for outpatient followup GI in clinic in the next 6 to 8 weeks Plan to follow-up and repeat iron indices for evaluation of microcytic anemia -Will determine timing of EGD/Colonoscopy for microcytic anemia when not dealing with active inflammation From a GI standpoint be on the look out for changes in bowel habits in the coming weeks/months from cholecystectomy Patient will follow-up with general surgery in the interim No plan for further endoscopic ultrasound evaluation of the bile duct currently unless something dramatically  changes in the future No contraindication for discharge from a GI perspective if deemed safe   Please page/call with questions or concerns.   Justice Britain, MD [facility] Office # [phone]    LOS: 2 days  Shawndale Collingsworth  09/15/2022, 10:12 AM

## 2022-09-15 NOTE — Progress Notes (Signed)
Mobility Specialist - Progress Note   09/15/22 0938  Mobility  Activity Ambulated with assistance in hallway  Level of Assistance Standby assist, set-up cues, supervision of patient - no hands on  Assistive Device Front wheel walker  Distance Ambulated (ft) 275 ft  Activity Response Tolerated well  Mobility Referral Yes  $Mobility charge 1 Mobility   Pt received in bed and agreeable to mobility. No complaints during session. Pt stated several times, she was ready to go home. Pt to bed after session with all needs met.    [facility]

## 2022-09-15 NOTE — Discharge Summary (Signed)
[facility]  Physician Discharge Summary   Patient ID: Cathy Robbins MRN: [id number] DOB/AGE: 1955/08/11 67 y.o.  Admit date: 09/12/2022 Discharge date: 09/15/2022    PCP: Horald Pollen, MD  DISCHARGE DIAGNOSES:  Acute Cholecystitis   Hyperlipidemia   Uncontrolled type 2 diabetes mellitus with hyperglycemia ([place])   Hypertension associated with diabetes ([place])   Stage 3a chronic kidney disease ([place])   Elevated LFTs   RECOMMENDATIONS FOR OUTPATIENT FOLLOW UP: F/u with PCP next week to check LFT's   Home Health:None  Equipment/Devices:None   CODE STATUS:Full Code   DISCHARGE CONDITION: fair  Diet recommendation: Low fat  INITIAL HISTORY: 67 y.o. female with a past medical history of hyperlipidemia, diabetes mellitus type 2, essential hypertension who presented with complaints of abdominal pain.  Patient has had recurrent upper abdominal pain ongoing for several months with recent worsening.  Patient was found to have elevated transaminases.  Imaging studies raised concern for a biliary process.  Patient was hospitalized for further management.   Consultants: Gastroenterology.  General surgery   Procedures: Cholecystectomy  HOSPITAL COURSE:   Acute cholecystitis/abnormal LFTs Imaging studies raise concern for a calculus cholecystitis.  Bilirubin was noted to be elevated but alkaline phosphatase was normal.  MRCP did not show any choledocholithiasis. Patient was seen by gastroenterology and general surgery. Patient underwent HIDA scan which showed raised concern for cholecystitis. Patient underwent cholecystectomy. LFT's are better. Gen surg and GI to follow in outpatient setting. Doing well today. Pain is well controlled.  LFTs are stable. Recheck labs in outpatient setting in 1 week.  Hepatitis panel is unremarkable.  Diabetes mellitus type 2, uncontrolled with hyperglycemia HbA1c 8.0 on March 5. Resume home meds.   Essential hypertension   Microcytic  anemia No evidence of overt bleeding.  Anemia panel reviewed.  No clear deficiencies identified. Will need further evaluation in the outpatient setting.  Plan is for GI evaluation in the outpatient setting.  Patient is stable. Cleared by GI and general surgery for discharge. [place] [facility] for discharge home today.    PERTINENT LABS:  The results of significant diagnostics from this hospitalization (including imaging, microbiology, ancillary and laboratory) are listed below for reference.    Microbiology: Recent Results (from the past 240 hour(s))  Surgical pcr screen     Status: None   Collection Time: 09/14/22  2:01 AM   Specimen: Nasal Mucosa; Nasal Swab  Result Value Ref Range Status   MRSA, PCR NEGATIVE NEGATIVE Final   Staphylococcus aureus NEGATIVE NEGATIVE Final    Comment: (NOTE) The Xpert SA Assay (FDA approved for NASAL specimens in patients 64 years of age and older), is one component of a comprehensive surveillance program. It is not intended to diagnose infection nor to guide or monitor treatment. Performed at [facility], [place] [address], [place], [place] [address]      Labs:   Basic Metabolic Panel: Recent Labs  Lab 09/12/22 1145 09/13/22 0517 09/14/22 0439 09/15/22 0439  NA 136 137 138 135  K 3.9 3.7 3.9 3.7  CL 101 107 107 105  CO2 24 21* 22 20*  GLUCOSE 219* 81 139* 112*  BUN 18 15 17 15   CREATININE 0.89 0.90 1.01* 0.98  CALCIUM 8.9 8.0* 8.1* 8.1*   Liver Function Tests: Recent Labs  Lab 09/12/22 1145 09/13/22 0517 09/14/22 0439 09/15/22 0439  AST 204* 220* 113* 71*  ALT 100* 207* 161* 145*  ALKPHOS 76 80 93 91  BILITOT 2.5* 3.6* 1.2 1.6*  PROT  8.1 6.8 6.3* 6.8  ALBUMIN 4.1 3.4* 3.1* 3.3*   Recent Labs  Lab 09/12/22 1145  LIPASE 42    CBC: Recent Labs  Lab 09/12/22 1145 09/13/22 0517 09/14/22 0439 09/15/22 0439  WBC 19.6* 13.8* 10.4 13.7*  HGB 11.3* 10.2* 9.7* 10.7*  HCT 37.0 32.7* 31.3* 34.1*  MCV 74.0* 74.7*  74.9* 73.3*  PLT 243 231 218 232     CBG: Recent Labs  Lab 09/14/22 1553 09/14/22 1952 09/15/22 0001 09/15/22 0402 09/15/22 0742  GLUCAP 165* 145* 118* 105* 120*     IMAGING STUDIES DG Cholangiogram Operative  Result Date: 09/14/2022 CLINICAL DATA:  Status post cholecystectomy. EXAM: INTRAOPERATIVE CHOLANGIOGRAM TECHNIQUE: Cholangiographic images from the C-arm fluoroscopic device were submitted for interpretation post-operatively. Please see the procedural report for the amount of contrast and the fluoroscopy time utilized. FLUOROSCOPY: Radiation Exposure Index (as provided by the fluoroscopic device): 2.89 mGy Kerma COMPARISON:  MR abdomen 09/12/2022 FINDINGS: Intraoperative cholangiogram was performed following cholecystectomy. Contrast injection opacifies the common bile duct and intrahepatic ducts with spillage into the duodenum. No extravasation of contrast material identified. No focal filling defects identified within the CBD. IMPRESSION: 1. Status post cholecystectomy. 2. No signs of choledocholithiasis or contrast extravasation to suggest bile leak. Electronically Signed   By: Kerby Moors M.D.   On: 09/14/2022 09:22   NM Hepato W/EF  Result Date: 09/13/2022 CLINICAL DATA:  Concern for cholecystitis. EXAM: NUCLEAR MEDICINE HEPATOBILIARY IMAGING TECHNIQUE: Sequential images of the abdomen were obtained out to 60 minutes following intravenous administration of radiopharmaceutical. An additional 30 minutes of scintigraphic imaging was obtained with lateral views. RADIOPHARMACEUTICALS:  7.6 mCi Tc-76m  Choletec IV COMPARISON:  CT MRI and ultrasound dated September 12, 2022. FINDINGS: Prompt uptake and biliary excretion of activity by the liver is seen. Gallbladder activity is not visualized. Biliary activity passes into small bowel, consistent with patent common bile duct. IMPRESSION: Nonvisualization of the gallbladder compatible with acute cholecystitis. Electronically Signed   By:  Dahlia Bailiff M.D.   On: 09/13/2022 15:55   MR ABDOMEN MRCP W WO CONTAST  Result Date: 09/13/2022 CLINICAL DATA:  Jaundice.  Elevated white count and gallstones. EXAM: MRI ABDOMEN WITHOUT AND WITH CONTRAST (INCLUDING MRCP) TECHNIQUE: Multiplanar multisequence MR imaging of the abdomen was performed both before and after the administration of intravenous contrast. Heavily T2-weighted images of the biliary and pancreatic ducts were obtained, and three-dimensional MRCP images were rendered by post processing. CONTRAST:  17mL GADAVIST GADOBUTROL 1 MMOL/ML IV SOLN COMPARISON:  CT AP 09/12/2022 FINDINGS: Lower chest: No acute findings. Hepatobiliary: No mass or other parenchymal abnormality identified. Layering debris/sludge noted within the neck of the gallbladder, image 19/6 and image 14/5. There is diffuse gallbladder wall edema with mild pericholecystic fluid. Gallbladder wall thickness measures 5 mm. Mild intrahepatic bile duct dilatation. There is fusiform dilatation of the common bile duct which measures up to 1 cm. No signs of choledocholithiasis. Pancreas: There is no main duct dilatation, inflammation or mass identified. Spleen:  Within normal limits in size and appearance. Adrenals/Urinary Tract: Normal adrenal glands. No nephrolithiasis or hydronephrosis. No kidney mass identified. Stomach/Bowel: Visualized portions within the abdomen are unremarkable. Vascular/Lymphatic: Aortic atherosclerosis. No pathologically enlarged lymph nodes identified. No abdominal aortic aneurysm demonstrated. Other:  There is trace perihepatic ascites. Musculoskeletal: No suspicious bone lesions identified. IMPRESSION: 1. Diffuse gallbladder wall edema and mild pericholecystic fluid. Layering debris versus sludge noted within the dependent portion of the gallbladder. Findings are suspicious for acute acalculous cholecystitis. 2.  There is fusiform dilatation of the common bile duct which measures up to 1 cm. No signs of  choledocholithiasis or obstructing mass. 3.  Aortic Atherosclerosis (ICD10-I70.0). Electronically Signed   By: Kerby Moors M.D.   On: 09/13/2022 05:18   MR 3D Recon At Scanner  Result Date: 09/13/2022 CLINICAL DATA:  Jaundice.  Elevated white count and gallstones. EXAM: MRI ABDOMEN WITHOUT AND WITH CONTRAST (INCLUDING MRCP) TECHNIQUE: Multiplanar multisequence MR imaging of the abdomen was performed both before and after the administration of intravenous contrast. Heavily T2-weighted images of the biliary and pancreatic ducts were obtained, and three-dimensional MRCP images were rendered by post processing. CONTRAST:  58mL GADAVIST GADOBUTROL 1 MMOL/ML IV SOLN COMPARISON:  CT AP 09/12/2022 FINDINGS: Lower chest: No acute findings. Hepatobiliary: No mass or other parenchymal abnormality identified. Layering debris/sludge noted within the neck of the gallbladder, image 19/6 and image 14/5. There is diffuse gallbladder wall edema with mild pericholecystic fluid. Gallbladder wall thickness measures 5 mm. Mild intrahepatic bile duct dilatation. There is fusiform dilatation of the common bile duct which measures up to 1 cm. No signs of choledocholithiasis. Pancreas: There is no main duct dilatation, inflammation or mass identified. Spleen:  Within normal limits in size and appearance. Adrenals/Urinary Tract: Normal adrenal glands. No nephrolithiasis or hydronephrosis. No kidney mass identified. Stomach/Bowel: Visualized portions within the abdomen are unremarkable. Vascular/Lymphatic: Aortic atherosclerosis. No pathologically enlarged lymph nodes identified. No abdominal aortic aneurysm demonstrated. Other:  There is trace perihepatic ascites. Musculoskeletal: No suspicious bone lesions identified. IMPRESSION: 1. Diffuse gallbladder wall edema and mild pericholecystic fluid. Layering debris versus sludge noted within the dependent portion of the gallbladder. Findings are suspicious for acute acalculous  cholecystitis. 2. There is fusiform dilatation of the common bile duct which measures up to 1 cm. No signs of choledocholithiasis or obstructing mass. 3.  Aortic Atherosclerosis (ICD10-I70.0). Electronically Signed   By: Kerby Moors M.D.   On: 09/13/2022 05:18   CT ABDOMEN PELVIS W CONTRAST  Result Date: 09/12/2022 CLINICAL DATA:  Epigastric pain, intermittent for 4 months EXAM: CT ABDOMEN AND PELVIS WITH CONTRAST TECHNIQUE: Multidetector CT imaging of the abdomen and pelvis was performed using the standard protocol following bolus administration of intravenous contrast. RADIATION DOSE REDUCTION: This exam was performed according to the departmental dose-optimization program which includes automated exposure control, adjustment of the [place] and/or kV according to patient size and/or use of iterative reconstruction technique. CONTRAST:  162mL OMNIPAQUE IOHEXOL 300 MG/ML  SOLN COMPARISON:  09/12/2022 FINDINGS: Lower chest: No acute pleural or parenchymal lung disease. Hepatobiliary: There is moderate gallbladder distension, with small calcified gallstones layering dependently. Borderline gallbladder wall thickening measuring 3-4 mm. No pericholecystic fluid. The liver is unremarkable. Common bile duct is dilated measuring up to 11 mm in diameter. No intrahepatic duct dilation. No choledocholithiasis. Pancreas: Unremarkable. No pancreatic ductal dilatation or surrounding inflammatory changes. Spleen: Normal in size without focal abnormality. Adrenals/Urinary Tract: Adrenal glands are unremarkable. Kidneys are normal, without renal calculi, focal lesion, or hydronephrosis. Bladder is unremarkable. Stomach/Bowel: No bowel obstruction or ileus. The appendix is not identified. No bowel wall thickening or inflammatory change. Vascular/Lymphatic: Aortic atherosclerosis with irregular atheromatous plaque through the out the infrarenal abdominal aorta. No evidence of dissection or high-grade stenosis. Prominent  atherosclerosis of the bilateral iliac arteries, with estimated 50% stenosis within the right common iliac artery just proximal to the bifurcation. Proximal outflow vessels appear unremarkable. No pathologic adenopathy. Reproductive: Uterus and bilateral adnexa are unremarkable. Other: No free fluid or free  intraperitoneal gas. No abdominal wall hernia. Musculoskeletal: No acute or destructive bony lesions. Reconstructed images demonstrate no additional findings. IMPRESSION: 1. Distended gallbladder, with cholelithiasis and mild gallbladder wall thickening as above. Findings are equivocal for acute cholecystitis. If further evaluation is desired, nuclear medicine hepatobiliary scan could be performed. 2. Dilated common bile duct without intrahepatic biliary duct dilation. This is of uncertain etiology, with no evidence of choledocholithiasis or obstructing mass identified. If further evaluation is desired, ERCP or MRCP could be considered. 3.  Aortic Atherosclerosis (ICD10-I70.0). Electronically Signed   By: Randa Ngo M.D.   On: 09/12/2022 16:38   US Abdomen Limited  Result Date: 09/12/2022 CLINICAL DATA:  Abdominal pain EXAM: ULTRASOUND ABDOMEN LIMITED RIGHT UPPER QUADRANT COMPARISON:  None Available. FINDINGS: Gallbladder: Mildly distended gallbladder with layering echogenic nonshadowing material along the gallbladder wall measuring up to 1.7 cm. No shadowing gallstones. Negative sonographic Murphy sign. Minimal nonspecific wall thickening. Common bile duct: Diameter: 8.5 mm, which is considered mildly dilated. No intrahepatic ductal dilation. No obstructing lesion identified sonographically. Liver: No focal lesion identified. Within normal limits in parenchymal echogenicity. Portal vein is patent on color Doppler imaging with normal direction of blood flow towards the liver. Other: None. IMPRESSION: Mildly dilated common bile duct, recommend correlation with LFTs for any signs of biliary obstruction. The  gallbladder is mildly distended with probable layering echogenic sludge, with minimal wall thickening and a negative sonographic Murphy sign. No shadowing gallstones identified. Electronically Signed   By: Maurine Simmering M.D.   On: 09/12/2022 15:13    DISCHARGE EXAMINATION: Vitals:   09/14/22 1806 09/14/22 2050 09/15/22 0131 09/15/22 0441  BP: (!) 141/65 (!) 140/62 128/63 (!) 158/67  Pulse: 69 71 67 69  Resp: 16 16 16 16   Temp: 98.4 F (36.9 C) 98.4 F (36.9 C) 98.2 F (36.8 C) 98.1 F (36.7 C)  TempSrc: Oral Oral Oral Oral  SpO2: 100% 99% 97% 99%  Weight:      Height:       General appearance: alert, cooperative, and appears stated age Resp: clear to auscultation bilaterally Cardio: regular rate and rhythm, S1, S2 normal, no murmur, click, rub or gallop  DISPOSITION: Home  Discharge Instructions     Call MD for:  difficulty breathing, headache or visual disturbances   Complete by: As directed    Call MD for:  extreme fatigue   Complete by: As directed    Call MD for:  persistant dizziness or light-headedness   Complete by: As directed    Call MD for:  persistant nausea and vomiting   Complete by: As directed    Call MD for:  severe uncontrolled pain   Complete by: As directed    Call MD for:  temperature >100.4   Complete by: As directed    Diet Carb Modified   Complete by: As directed    Discharge instructions   Complete by: As directed    Please be sure to follow-up with your primary care provider next week to check your blood work which would include a complete blood count as well as complete metabolic panel. Take your medications as prescribed including the antibiotics. Gastroenterology will arrange outpatient follow-up for you to undergo colonoscopy and upper endoscopy in the next 6 to 8 weeks. General surgery will arrange follow-up for you as well.  You were cared for by a hospitalist during your hospital stay. If you have any questions about your discharge  medications or the care you received while you  were in the hospital after you are discharged, you can call the unit and asked to speak with the hospitalist on call if the hospitalist that took care of you is not available. Once you are discharged, your primary care physician will handle any further medical issues. Please note that NO REFILLS for any discharge medications will be authorized once you are discharged, as it is imperative that you return to your primary care physician (or establish a relationship with a primary care physician if you do not have one) for your aftercare needs so that they can reassess your need for medications and monitor your lab values. If you do not have a primary care physician, you can call [phone] for a physician referral.   Increase activity slowly   Complete by: As directed          Allergies as of 09/15/2022   No Known Allergies      Medication List     STOP taking these medications    atorvastatin 40 MG tablet Commonly known as: LIPITOR   gabapentin 100 MG capsule Commonly known as: NEURONTIN       TAKE these medications    amoxicillin-clavulanate 875-125 MG tablet Commonly known as: AUGMENTIN Take 1 tablet by mouth 2 (two) times daily for 5 days.   Contour Next Control Normal Soln 1 drop by In Vitro route as needed.   dapagliflozin propanediol 10 MG Tabs tablet Commonly known as: Farxiga Take 1 tablet (10 mg total) by mouth daily before breakfast.   glipiZIDE 5 MG tablet Commonly known as: GLUCOTROL Take 1 tablet (5 mg total) by mouth 2 (two) times daily before a meal.   lisinopril 20 MG tablet Commonly known as: ZESTRIL Take 1 tablet (20 mg total) by mouth daily.   oxyCODONE 5 MG immediate release tablet Commonly known as: Oxy IR/ROXICODONE Take 1 tablet (5 mg total) by mouth every 4 (four) hours as needed for severe pain.          Follow-up [facility], [place]. Schedule an appointment as soon  as possible for a visit in 2 week(s).   Specialty: General Surgery Contact information: [address] [place], [place], MD Follow up in 1 week(s).   Specialty: Internal Medicine Why: for blood work to check CBC and complete Actuary information: [place] [address] [phone]         Doran Stabler, MD. Schedule an appointment as soon as possible for a visit in 4 week(s).   Specialty: Gastroenterology Why: post hospitalization follow up Contact information: [place] [address] [phone]                 TOTAL DISCHARGE TIME: 35 mins  [place]  [facility] Pager on [email or web address]  09/15/2022, 1:51 PM

## 2022-09-15 NOTE — Plan of Care (Signed)

## 2022-09-16 ENCOUNTER — Telehealth: Payer: Self-pay | Admitting: Gastroenterology

## 2022-09-16 NOTE — Telephone Encounter (Signed)
Left message to return call 

## 2022-09-16 NOTE — Telephone Encounter (Signed)
Inbound call from patient's daughter, states Dr. Loletha Carrow did her Gallbladder removal over the weekend, Is requesting a work note.

## 2022-09-17 ENCOUNTER — Encounter: Payer: Self-pay | Admitting: *Deleted

## 2022-09-17 ENCOUNTER — Telehealth: Payer: Self-pay | Admitting: *Deleted

## 2022-09-17 LAB — SURGICAL PATHOLOGY

## 2022-09-17 NOTE — Transitions of Care (Post Inpatient/ED Visit) (Signed)
   09/17/2022  Name: Cathy Robbins MRN: [id number] DOB: 12-05-1955  Today's TOC FU Call Status: Today's TOC FU Call Status:: Unsuccessul Call (1st Attempt) Unsuccessful Call (1st Attempt) Date: 09/17/22  Attempted to reach the patient regarding the most recent Inpatient visit; ; left HIPAA compliant voice message requesting call back  Follow Up Plan: Additional outreach attempts will be made to reach the patient to complete the Transitions of Care (Post Inpatient visit) call.   Oneta Rack, RN, BSN, CCRN Alumnus RN CM Care Coordination/ Transition of [place] [facility] [phone]: direct office

## 2022-09-18 ENCOUNTER — Telehealth: Payer: Self-pay | Admitting: *Deleted

## 2022-09-18 ENCOUNTER — Encounter: Payer: Self-pay | Admitting: *Deleted

## 2022-09-18 NOTE — Transitions of Care (Post Inpatient/ED Visit) (Signed)
   09/18/2022  Name: Cathy Robbins MRN: [id number] DOB: [birth year]  Today's TOC FU Call Status: Today's TOC FU Call Status:: Unsuccessful Call (2nd Attempt) Unsuccessful Call (2nd Attempt) Date: 09/18/22  Attempted to reach the patient regarding the most recent Inpatient visit; left HIPAA compliant voice message requesting call back  Follow Up Plan: Additional outreach attempts will be made to reach the patient to complete the Transitions of Care (Post Inpatient visit) call.   Oneta Rack, RN, BSN, CCRN Alumnus RN CM Care Coordination/ Transition of [place] [facility] [phone]: direct office

## 2022-09-19 ENCOUNTER — Encounter: Payer: Self-pay | Admitting: *Deleted

## 2022-09-19 ENCOUNTER — Telehealth: Payer: Self-pay | Admitting: *Deleted

## 2022-09-19 ENCOUNTER — Telehealth: Payer: Self-pay

## 2022-09-19 NOTE — Transitions of Care (Post Inpatient/ED Visit) (Signed)
   09/19/2022  Name: Cathy Robbins MRN: [id number] DOB: Jul 05, 1955  Today's TOC FU Call Status: Today's TOC FU Call Status:: Unsuccessful Call (3rd Attempt) Unsuccessful Call (3rd Attempt) Date: 09/19/22  Attempted to reach the patient regarding the most recent Inpatient visit; left HIPAA compliant voice message requesting call back  Follow Up Plan: No further outreach attempts will be made at this time. We have been unable to contact the patient.  Oneta Rack, RN, BSN, CCRN Alumnus RN CM Care Coordination/ Transition of [place] Management [phone]: direct office

## 2022-09-19 NOTE — Telephone Encounter (Signed)
-----   Message from Timothy Lasso, RN sent at 09/16/2022  8:28 AM EDT ----- Regarding: FW: Follow-up  ----- Message ----- From: Irving Copas., MD Sent: 09/15/2022  10:14 AM EDT To: Timothy Lasso, RN; Doran Stabler, MD Subject: Follow-up                                      Cathy Robbins, Please forward this to Dr. Corena Pilgrim RN. Patient needs follow-up with one of the APP's or Dr. Loletha Carrow in the next 6 to 10 weeks. They will decide upon repeat labs whether microcytic anemia will require colonoscopy/endoscopy. Patient to be discharged on Sunday or on Monday. Thanks. GM

## 2022-09-19 NOTE — Telephone Encounter (Signed)
Patient has been scheduled for an 8-week hospital follow up appointment with Tye Savoy, NP on Friday, 11/15/22 at 9:30 am. Appt information mailed to patient.

## 2022-09-20 NOTE — Telephone Encounter (Signed)
No return communication. Will await a return call.

## 2022-09-25 ENCOUNTER — Ambulatory Visit (INDEPENDENT_AMBULATORY_CARE_PROVIDER_SITE_OTHER): Payer: Medicare Other | Admitting: Emergency Medicine

## 2022-09-25 ENCOUNTER — Encounter: Payer: Self-pay | Admitting: Emergency Medicine

## 2022-09-25 VITALS — BP 100/64 | HR 64 | Temp 98.5°F | Ht 62.0 in | Wt 106.1 lb

## 2022-09-25 DIAGNOSIS — Z09 Encounter for follow-up examination after completed treatment for conditions other than malignant neoplasm: Secondary | ICD-10-CM | POA: Diagnosis not present

## 2022-09-25 DIAGNOSIS — I152 Hypertension secondary to endocrine disorders: Secondary | ICD-10-CM

## 2022-09-25 DIAGNOSIS — E1169 Type 2 diabetes mellitus with other specified complication: Secondary | ICD-10-CM | POA: Diagnosis not present

## 2022-09-25 DIAGNOSIS — E1159 Type 2 diabetes mellitus with other circulatory complications: Secondary | ICD-10-CM | POA: Diagnosis not present

## 2022-09-25 DIAGNOSIS — E785 Hyperlipidemia, unspecified: Secondary | ICD-10-CM

## 2022-09-25 DIAGNOSIS — K8080 Other cholelithiasis without obstruction: Secondary | ICD-10-CM

## 2022-09-25 DIAGNOSIS — Z9049 Acquired absence of other specified parts of digestive tract: Secondary | ICD-10-CM | POA: Diagnosis not present

## 2022-09-25 DIAGNOSIS — N1831 Chronic kidney disease, stage 3a: Secondary | ICD-10-CM | POA: Diagnosis not present

## 2022-09-25 LAB — CBC WITH DIFFERENTIAL/PLATELET
Basophils Absolute: 0.1 10*3/uL (ref 0.0–0.1)
Basophils Relative: 0.6 % (ref 0.0–3.0)
Eosinophils Absolute: 0.1 10*3/uL (ref 0.0–0.7)
Eosinophils Relative: 1.4 % (ref 0.0–5.0)
HCT: 36 % (ref 36.0–46.0)
Hemoglobin: 11.5 g/dL — ABNORMAL LOW (ref 12.0–15.0)
Lymphocytes Relative: 32 % (ref 12.0–46.0)
Lymphs Abs: 3.3 10*3/uL (ref 0.7–4.0)
MCHC: 31.9 g/dL (ref 30.0–36.0)
MCV: 72.7 fl — ABNORMAL LOW (ref 78.0–100.0)
Monocytes Absolute: 0.7 10*3/uL (ref 0.1–1.0)
Monocytes Relative: 6.6 % (ref 3.0–12.0)
Neutro Abs: 6.2 10*3/uL (ref 1.4–7.7)
Neutrophils Relative %: 59.4 % (ref 43.0–77.0)
Platelets: 380 10*3/uL (ref 150.0–400.0)
RBC: 4.95 Mil/uL (ref 3.87–5.11)
RDW: 15.3 % (ref 11.5–15.5)
WBC: 10.4 10*3/uL (ref 4.0–10.5)

## 2022-09-25 LAB — COMPREHENSIVE METABOLIC PANEL
ALT: 21 U/L (ref 0–35)
AST: 18 U/L (ref 0–37)
Albumin: 4 g/dL (ref 3.5–5.2)
Alkaline Phosphatase: 78 U/L (ref 39–117)
BUN: 24 mg/dL — ABNORMAL HIGH (ref 6–23)
CO2: 28 mEq/L (ref 19–32)
Calcium: 9.6 mg/dL (ref 8.4–10.5)
Chloride: 104 mEq/L (ref 96–112)
Creatinine, Ser: 1.08 mg/dL (ref 0.40–1.20)
GFR: 52.68 mL/min — ABNORMAL LOW (ref >60.00)
Glucose, Bld: 106 mg/dL — ABNORMAL HIGH (ref 70–99)
Potassium: 4.5 mEq/L (ref 3.5–5.1)
Sodium: 138 mEq/L (ref 135–145)
Total Bilirubin: 0.5 mg/dL (ref 0.2–1.2)
Total Protein: 7.9 g/dL (ref 6.0–8.3)

## 2022-09-25 LAB — LIPID PANEL
Cholesterol: 249 mg/dL — ABNORMAL HIGH (ref 0–200)
HDL: 34.2 mg/dL — ABNORMAL LOW (ref >39.00)
Total CHOL/HDL Ratio: 7
Triglycerides: 661 mg/dL — ABNORMAL HIGH (ref 0.0–149.0)

## 2022-09-25 LAB — HEMOGLOBIN A1C: Hgb A1c MFr Bld: 7.9 % — ABNORMAL HIGH (ref 4.6–6.5)

## 2022-09-25 LAB — LDL CHOLESTEROL, DIRECT: Direct LDL: 74 mg/dL

## 2022-09-25 NOTE — Progress Notes (Signed)
Cathy Robbins 67 y.o.   Chief Complaint  Patient presents with   Medical Management of Chronic Issues    F/u patient had surgery on 3/16, one of her labs was elevated, needed to be seen to get those rechecked     HISTORY OF PRESENT ILLNESS: This is a 67 y.o. female here for hospital discharge follow-up Accompanied by daughter.  Doing well.  Off pain medications. Eating well.  No nausea or vomiting.  No fever or chills.  No abdominal pain. Moving bowels daily.  No urinating problems. Status post cholecystectomy on 09/14/2022 Discharge summary as follows:  Physician Discharge Summary    Patient ID: Cathy Robbins MRN: [id number] DOB/AGE: 1956-02-19 67 y.o.   Admit date: 09/12/2022 Discharge date: 09/15/2022     PCP: Horald Pollen, MD   DISCHARGE DIAGNOSES:  Acute Cholecystitis   Hyperlipidemia   Uncontrolled type 2 diabetes mellitus with hyperglycemia ([place])   Hypertension associated with diabetes ([place])   Stage 3a chronic kidney disease ([place])   Elevated LFTs  Acute cholecystitis/abnormal LFTs Imaging studies raise concern for a calculus cholecystitis.  Bilirubin was noted to be elevated but alkaline phosphatase was normal.  MRCP did not show any choledocholithiasis. Patient was seen by gastroenterology and general surgery. Patient underwent HIDA scan which showed raised concern for cholecystitis. Patient underwent cholecystectomy. LFT's are better. Gen surg and GI to follow in outpatient setting. Doing well today. Pain is well controlled.  LFTs are stable. Recheck labs in outpatient setting in 1 week.  Hepatitis panel is unremarkable.   Diabetes mellitus type 2, uncontrolled with hyperglycemia HbA1c 8.0 on March 5. Resume home meds.   Essential hypertension   Microcytic anemia No evidence of overt bleeding.  Anemia panel reviewed.  No clear deficiencies identified. Will need further evaluation in the outpatient setting.  Plan is for GI evaluation in the outpatient  setting.  HPI   Prior to Admission medications   Medication Sig Start Date End Date Taking? Authorizing Provider  Blood Glucose Calibration (CONTOUR NEXT CONTROL) Normal SOLN 1 drop by In Vitro route as needed. 12/19/17  Yes McVey, Gelene Mink, PA-C  dapagliflozin propanediol (FARXIGA) 10 MG TABS tablet Take 1 tablet (10 mg total) by mouth daily before breakfast. 02/26/22 02/21/23 Yes Johnasia Liese, Ines Bloomer, MD  glipiZIDE (GLUCOTROL) 5 MG tablet Take 1 tablet (5 mg total) by mouth 2 (two) times daily before a meal. 06/04/22 05/30/23 Yes Thekla Colborn, Ines Bloomer, MD  lisinopril (ZESTRIL) 20 MG tablet Take 1 tablet (20 mg total) by mouth daily. 01/29/22  Yes Waldon Sheerin, Ines Bloomer, MD  oxyCODONE (OXY IR/ROXICODONE) 5 MG immediate release tablet Take 1 tablet (5 mg total) by mouth every 4 (four) hours as needed for severe pain. 09/15/22  Yes Bonnielee Haff, MD    No Known Allergies  Patient Active Problem List   Diagnosis Date Noted   Abnormal LFTs 09/13/2022   Abdominal pain, chronic, right upper quadrant 09/13/2022   Abdominal pain 09/12/2022   Transaminitis 09/12/2022   Cholelithiasis 09/12/2022   Elevated LFTs 09/12/2022   Non-English speaking patient (Vietnamese) 09/12/2022   History of COVID-19 09/12/2022   Anemia 09/12/2022   Stage 3a chronic kidney disease ([place]) 06/04/2022   Uncontrolled type 2 diabetes mellitus with hyperglycemia ([place]) 01/03/2022   Hypertension associated with diabetes ([place]) 01/03/2022   Chronic pain of left lower extremity 07/14/2019   Positive ANA (antinuclear antibody) 07/14/2019   Dyslipidemia associated with type 2 diabetes mellitus ([place]) 11/20/2015   Hyperlipidemia 11/20/2015  Past Medical History:  Diagnosis Date   Chronic pain of left lower extremity 07/14/2019   Diabetes mellitus without complication ([place])    History of COVID-19 09/12/2022   Hyperlipidemia 11/20/2015   Hypertension associated with diabetes ([place]) 01/03/2022   Stage 3a chronic  kidney disease ([place]) 06/04/2022    Past Surgical History:  Procedure Laterality Date   CHOLECYSTECTOMY N/A 09/14/2022   Procedure: LAPAROSCOPIC CHOLECYSTECTOMY WITH INTRAOPERATIVE CHOLANGIOGRAM;  Surgeon: Erroll Luna, MD;  Location: [facility];  Service: General;  Laterality: N/A;    Social History   Socioeconomic History   Marital status: Married    Spouse name: Not on file   Number of children: 4   Years of education: Not on file   Highest education level: Not on file  Occupational History   Not on file  Tobacco Use   Smoking status: Never   Smokeless tobacco: Never  Vaping Use   Vaping Use: Never used  Substance and Sexual Activity   Alcohol use: No   Drug use: No   Sexual activity: Not Currently  Other Topics Concern   Not on file  Social History Narrative   Not on file   Social Determinants of Health   Financial Resource Strain: Low Risk  (01/12/2019)   Overall Financial Resource Strain (CARDIA)    Difficulty of Paying Living Expenses: Not hard at all  Food Insecurity: No Food Insecurity (09/13/2022)   Hunger Vital Sign    Worried About Running Out of Food in the Last Year: Never true    Ran Out of Food in the Last Year: Never true  Transportation Needs: No Transportation Needs (09/13/2022)   PRAPARE - Hydrologist (Medical): No    Lack of Transportation (Non-Medical): No  Physical Activity: Sufficiently Active (01/12/2019)   Exercise Vital Sign    Days of Exercise per Week: 5 days    Minutes of Exercise per Session: 30 min  Stress: No Stress Concern Present (01/12/2019)   [place]    Feeling of Stress : Not at all  Social Connections: Moderately Isolated (01/12/2019)   Social Connection and Isolation Panel [NHANES]    Frequency of Communication with Friends and Family: Twice a week    Frequency of Social Gatherings with Friends and Family: Once a week    Attends  Religious Services: Never    Marine scientist or Organizations: No    Attends Archivist Meetings: Never    Marital Status: Married  Human resources officer Violence: Not At Risk (09/13/2022)   Humiliation, Afraid, Rape, and Kick questionnaire    Fear of Current or Ex-Partner: No    Emotionally Abused: No    Physically Abused: No    Sexually Abused: No    No family history on file.   Review of Systems  Constitutional: Negative.  Negative for chills and fever.  HENT: Negative.  Negative for congestion and sore throat.   Respiratory: Negative.  Negative for cough and shortness of breath.   Cardiovascular: Negative.  Negative for chest pain and palpitations.  Gastrointestinal:  Negative for abdominal pain, diarrhea, nausea and vomiting.  Genitourinary: Negative.  Negative for dysuria and hematuria.  Skin: Negative.  Negative for rash.  Neurological: Negative.  Negative for dizziness and headaches.  All other systems reviewed and are negative.   Vitals:   09/25/22 1438  BP: 100/64  Pulse: 64  Temp: 98.5 F (36.9 C)  SpO2:  98%    Physical Exam Vitals reviewed.  Constitutional:      Appearance: Normal appearance.  HENT:     Head: Normocephalic.     Mouth/Throat:     Mouth: Mucous membranes are moist.     Pharynx: Oropharynx is clear.  Eyes:     Extraocular Movements: Extraocular movements intact.     Pupils: Pupils are equal, round, and reactive to light.  Cardiovascular:     Rate and Rhythm: Normal rate and regular rhythm.     Pulses: Normal pulses.     Heart sounds: Normal heart sounds.  Pulmonary:     Effort: Pulmonary effort is normal.     Breath sounds: Normal breath sounds.  Abdominal:     Palpations: Abdomen is soft.     Tenderness: There is no abdominal tenderness.  Musculoskeletal:     Cervical back: No tenderness.     Right lower leg: No edema.     Left lower leg: No edema.  Lymphadenopathy:     Cervical: No cervical adenopathy.  Skin:     General: Skin is warm and dry.     Capillary Refill: Capillary refill takes less than 2 seconds.  Neurological:     General: No focal deficit present.     Mental Status: She is alert and oriented to person, place, and time.  Psychiatric:        Mood and Affect: Mood normal.        Behavior: Behavior normal.      ASSESSMENT & PLAN: A total of 46 minutes was spent with the patient and counseling/coordination of care regarding preparing for this visit, review of most recent office visit notes, review of most recent hospital discharge summary, review of multiple chronic medical conditions under management, review of all medications, education on nutrition, review of most recent blood work results, prognosis, documentation and need for follow-up.  Problem List Items Addressed This Visit       Cardiovascular and Mediastinum   Hypertension associated with diabetes ([place]) - Primary    Well-controlled hypertension Continue lisinopril 20 mg daily BP Readings from Last 3 Encounters:  09/25/22 100/64  09/15/22 (!) 158/67  09/03/22 122/68  Well-controlled diabetes Continue Farxiga 10 mg and glipizide 5 mg twice a day Cardiovascular risks associated with hypertension and diabetes discussed Diet and nutrition discussed Follow-up in 3 months        Relevant Orders   Comprehensive metabolic panel   CBC with Differential/Platelet   Lipid panel   Hemoglobin A1c     Digestive   Cholelithiasis    S/p cholecystectomy Recovering well.  No complications. Eating and drinking well.  No nausea or vomiting Moving bowels okay.  No abdominal pain Urinating without difficulty Off pain medications        Endocrine   Dyslipidemia associated with type 2 diabetes mellitus (HCC)    Chronic stable condition Lipid profile done today The 10-year ASCVD risk score (Arnett DK, et al., 2019) is: 11.9%   Values used to calculate the score:     Age: 4 years     Sex: Female     Is Non-Hispanic African  American: No     Diabetic: Yes     Tobacco smoker: No     Systolic Blood Pressure: 123XX123 mmHg     Is BP treated: Yes     HDL Cholesterol: 44.9 mg/dL     Total Cholesterol: 173 mg/dL       Relevant Orders  Comprehensive metabolic panel   CBC with Differential/Platelet   Lipid panel   Hemoglobin A1c     Genitourinary   Stage 3a chronic kidney disease ([place])   Other Visit Diagnoses     S/P cholecystectomy       Hospital discharge follow-up          Patient Instructions  Health Maintenance After Age 35 After age 64, you are at a higher risk for certain long-term diseases and infections as well as injuries from falls. Falls are a major cause of broken bones and head injuries in people who are older than age 84. Getting regular preventive care can help to keep you healthy and well. Preventive care includes getting regular testing and making lifestyle changes as recommended by your health care provider. Talk with your health care provider about: Which screenings and tests you should have. A screening is a test that checks for a disease when you have no symptoms. A diet and exercise plan that is right for you. What should I know about screenings and tests to prevent falls? Screening and testing are the best ways to find a health problem early. Early diagnosis and treatment give you the best chance of managing medical conditions that are common after age 30. Certain conditions and lifestyle choices may make you more likely to have a fall. Your health care provider may recommend: Regular vision checks. Poor vision and conditions such as cataracts can make you more likely to have a fall. If you wear glasses, make sure to get your prescription updated if your vision changes. Medicine review. Work with your health care provider to regularly review all of the medicines you are taking, including over-the-counter medicines. Ask your health care provider about any side effects that may make you more  likely to have a fall. Tell your health care provider if any medicines that you take make you feel dizzy or sleepy. Strength and balance checks. Your health care provider may recommend certain tests to check your strength and balance while standing, walking, or changing positions. Foot health exam. Foot pain and numbness, as well as not wearing proper footwear, can make you more likely to have a fall. Screenings, including: Osteoporosis screening. Osteoporosis is a condition that causes the bones to get weaker and break more easily. Blood pressure screening. Blood pressure changes and medicines to control blood pressure can make you feel dizzy. Depression screening. You may be more likely to have a fall if you have a fear of falling, feel depressed, or feel unable to do activities that you used to do. Alcohol use screening. Using too much alcohol can affect your balance and may make you more likely to have a fall. Follow these instructions at home: Lifestyle Do not drink alcohol if: Your health care provider tells you not to drink. If you drink alcohol: Limit how much you have to: 0-1 drink a day for women. 0-2 drinks a day for men. Know how much alcohol is in your drink. In the [place] one drink equals one 12 oz bottle of beer (355 mL), one 5 oz glass of wine (148 mL), or one 1 oz glass of hard liquor (44 mL). Do not use any products that contain nicotine or tobacco. These products include cigarettes, chewing tobacco, and vaping devices, such as e-cigarettes. If you need help quitting, ask your health care provider. Activity  Follow a regular exercise program to stay fit. This will help you maintain your balance. Ask your health care provider  what types of exercise are appropriate for you. If you need a cane or walker, use it as recommended by your health care provider. Wear supportive shoes that have nonskid soles. Safety  Remove any tripping hazards, such as rugs, cords, and  clutter. Install safety equipment such as grab bars in bathrooms and safety rails on stairs. Keep rooms and walkways well-lit. General instructions Talk with your health care provider about your risks for falling. Tell your health care provider if: You fall. Be sure to tell your health care provider about all falls, even ones that seem minor. You feel dizzy, tiredness (fatigue), or off-balance. Take over-the-counter and prescription medicines only as told by your health care provider. These include supplements. Eat a healthy diet and maintain a healthy weight. A healthy diet includes low-fat dairy products, low-fat (lean) meats, and fiber from whole grains, beans, and lots of fruits and vegetables. Stay current with your vaccines. Schedule regular health, dental, and eye exams. Summary Having a healthy lifestyle and getting preventive care can help to protect your health and wellness after age 37. Screening and testing are the best way to find a health problem early and help you avoid having a fall. Early diagnosis and treatment give you the best chance for managing medical conditions that are more common for people who are older than age 82. Falls are a major cause of broken bones and head injuries in people who are older than age 39. Take precautions to prevent a fall at home. Work with your health care provider to learn what changes you can make to improve your health and wellness and to prevent falls. This information is not intended to replace advice given to you by your health care provider. Make sure you discuss any questions you have with your health care provider. Document Revised: 11/06/2020 Document Reviewed: 11/06/2020 Elsevier Patient Education  [place], MD [facility]

## 2022-09-25 NOTE — Assessment & Plan Note (Signed)
Chronic stable condition Lipid profile done today The 10-year ASCVD risk score (Arnett DK, et al., 2019) is: 11.9%   Values used to calculate the score:     Age: 67 years     Sex: Female     Is Non-Hispanic African American: No     Diabetic: Yes     Tobacco smoker: No     Systolic Blood Pressure: 123XX123 mmHg     Is BP treated: Yes     HDL Cholesterol: 44.9 mg/dL     Total Cholesterol: 173 mg/dL

## 2022-09-25 NOTE — Assessment & Plan Note (Signed)
Well-controlled hypertension Continue lisinopril 20 mg daily BP Readings from Last 3 Encounters:  09/25/22 100/64  09/15/22 (!) 158/67  09/03/22 122/68  Well-controlled diabetes Continue Farxiga 10 mg and glipizide 5 mg twice a day Cardiovascular risks associated with hypertension and diabetes discussed Diet and nutrition discussed Follow-up in 3 months

## 2022-09-25 NOTE — Patient Instructions (Signed)
Health Maintenance After Age 69 After age 69, you are at a higher risk for certain long-term diseases and infections as well as injuries from falls. Falls are a major cause of broken bones and head injuries in people who are older than age 69. Getting regular preventive care can help to keep you healthy and well. Preventive care includes getting regular testing and making lifestyle changes as recommended by your health care provider. Talk with your health care provider about: Which screenings and tests you should have. A screening is a test that checks for a disease when you have no symptoms. A diet and exercise plan that is right for you. What should I know about screenings and tests to prevent falls? Screening and testing are the best ways to find a health problem early. Early diagnosis and treatment give you the best chance of managing medical conditions that are common after age 69. Certain conditions and lifestyle choices may make you more likely to have a fall. Your health care provider may recommend: Regular vision checks. Poor vision and conditions such as cataracts can make you more likely to have a fall. If you wear glasses, make sure to get your prescription updated if your vision changes. Medicine review. Work with your health care provider to regularly review all of the medicines you are taking, including over-the-counter medicines. Ask your health care provider about any side effects that may make you more likely to have a fall. Tell your health care provider if any medicines that you take make you feel dizzy or sleepy. Strength and balance checks. Your health care provider may recommend certain tests to check your strength and balance while standing, walking, or changing positions. Foot health exam. Foot pain and numbness, as well as not wearing proper footwear, can make you more likely to have a fall. Screenings, including: Osteoporosis screening. Osteoporosis is a condition that causes  the bones to get weaker and break more easily. Blood pressure screening. Blood pressure changes and medicines to control blood pressure can make you feel dizzy. Depression screening. You may be more likely to have a fall if you have a fear of falling, feel depressed, or feel unable to do activities that you used to do. Alcohol use screening. Using too much alcohol can affect your balance and may make you more likely to have a fall. Follow these instructions at home: Lifestyle Do not drink alcohol if: Your health care provider tells you not to drink. If you drink alcohol: Limit how much you have to: 0-1 drink a day for women. 0-2 drinks a day for men. Know how much alcohol is in your drink. In the [place] one drink equals one 12 oz bottle of beer (355 mL), one 5 oz glass of wine (148 mL), or one 1 oz glass of hard liquor (44 mL). Do not use any products that contain nicotine or tobacco. These products include cigarettes, chewing tobacco, and vaping devices, such as e-cigarettes. If you need help quitting, ask your health care provider. Activity  Follow a regular exercise program to stay fit. This will help you maintain your balance. Ask your health care provider what types of exercise are appropriate for you. If you need a cane or walker, use it as recommended by your health care provider. Wear supportive shoes that have nonskid soles. Safety  Remove any tripping hazards, such as rugs, cords, and clutter. Install safety equipment such as grab bars in bathrooms and safety rails on stairs. Keep rooms and walkways   well-lit. General instructions Talk with your health care provider about your risks for falling. Tell your health care provider if: You fall. Be sure to tell your health care provider about all falls, even ones that seem minor. You feel dizzy, tiredness (fatigue), or off-balance. Take over-the-counter and prescription medicines only as told by your health care provider. These include  supplements. Eat a healthy diet and maintain a healthy weight. A healthy diet includes low-fat dairy products, low-fat (lean) meats, and fiber from whole grains, beans, and lots of fruits and vegetables. Stay current with your vaccines. Schedule regular health, dental, and eye exams. Summary Having a healthy lifestyle and getting preventive care can help to protect your health and wellness after age 69. Screening and testing are the best way to find a health problem early and help you avoid having a fall. Early diagnosis and treatment give you the best chance for managing medical conditions that are more common for people who are older than age 69. Falls are a major cause of broken bones and head injuries in people who are older than age 69. Take precautions to prevent a fall at home. Work with your health care provider to learn what changes you can make to improve your health and wellness and to prevent falls. This information is not intended to replace advice given to you by your health care provider. Make sure you discuss any questions you have with your health care provider. Document Revised: 11/06/2020 Document Reviewed: 11/06/2020 Elsevier Patient Education  2023 Elsevier Inc.  

## 2022-09-25 NOTE — Assessment & Plan Note (Signed)
S/p cholecystectomy Recovering well.  No complications. Eating and drinking well.  No nausea or vomiting Moving bowels okay.  No abdominal pain Urinating without difficulty Off pain medications

## 2022-09-27 ENCOUNTER — Other Ambulatory Visit: Payer: Self-pay | Admitting: Emergency Medicine

## 2022-09-27 DIAGNOSIS — E1169 Type 2 diabetes mellitus with other specified complication: Secondary | ICD-10-CM

## 2022-11-15 ENCOUNTER — Other Ambulatory Visit (INDEPENDENT_AMBULATORY_CARE_PROVIDER_SITE_OTHER): Payer: Medicare Other

## 2022-11-15 ENCOUNTER — Ambulatory Visit: Payer: Medicare Other | Admitting: Nurse Practitioner

## 2022-11-15 ENCOUNTER — Encounter: Payer: Self-pay | Admitting: Nurse Practitioner

## 2022-11-15 VITALS — BP 130/62 | HR 78 | Ht 62.0 in | Wt 105.0 lb

## 2022-11-15 DIAGNOSIS — D509 Iron deficiency anemia, unspecified: Secondary | ICD-10-CM

## 2022-11-15 LAB — H. PYLORI ANTIBODY, IGG: H Pylori IgG: NEGATIVE

## 2022-11-15 LAB — FERRITIN: Ferritin: 310.2 ng/mL — ABNORMAL HIGH (ref 10.0–291.0)

## 2022-11-15 NOTE — Progress Notes (Signed)
Assessment and Plan   Primary GI: Amada Jupiter, MD   Brief Narrative:  67 y.o. yo female whose past medical history includes but is not necessarily limited to diabetes, hypertension, CKD, HDL, cholelithiasis status post cholecystectomy.   *Acute cholecystitis s/p laparascopic cholecystectomy March 2024  *Chronic (years) microcytic anemia . Iron deficiency? Maybe Thalassemia.  No overt GI bleeding. No focal GI symptoms. No FMH of gastric or colon cancer. Hgb stable at 11.5.  -Repeat iron studies now that acute inflammation has resolved ( acute cholecystitis) -H.pylori serology ( no known history of prior testing or infection) -Discussed recommendations for EGD and colonoscopy if proves to have iron deficiency.  However patient doesn't want endoscopic evaluation. She is fearful of the procedures   *Colon cancer screen. Reported negative Cologuard in 2023    History of Present Illness   Chief complaint: Hospital follow-up  Patient was seen by [place] hospital on 09/13/2022 for evaluation of abdominal pain, abnormal liver chemistries and microcytic anemia. US showed mild CBD dilation. MRCP demonstrated fusiform dilatation of the common bile duct up to 1 cm. No signs of choledocholithiasis or obstructing mass. HIDA was consistent with acute cholecystitis. She underwent a cholecystectomy with IOC on 3/16. The IOC showed no evidence of retained stone and was not significantly concerning for choledochocele.  Gallbladder pathology compatible with acute cholecystitis. Regarding microcytic anemia, the plan was for outpatient iron studies when acute inflammation resolved.   **[place] She doesn't not speak [place]. She comes with her Daughter and an interpreter  [place] feels fine. No further abdominal pain. No N/V. Her bowels are moving well. No blood in stool. No black stools.  No FMH of colon cancer or stomach.  Daughter tells me she was diagnosed with thalassemia        Latest Ref  Rng & Units 09/25/2022    3:11 PM 09/15/2022    4:39 AM 09/14/2022    4:39 AM  Hepatic Function  Total Protein 6.0 - 8.3 g/dL 7.9  6.8  6.3   Albumin 3.5 - 5.2 g/dL 4.0  3.3  3.1   AST 0 - 37 U/L 18  71  113   ALT 0 - 35 U/L 21  145  161   Alk Phosphatase 39 - 117 U/L 78  91  93   Total Bilirubin 0.2 - 1.2 mg/dL 0.5  1.6  1.2        Latest Ref Rng & Units 09/25/2022    3:11 PM 09/15/2022    4:39 AM 09/14/2022    4:39 AM  CBC  WBC 4.0 - 10.5 K/uL 10.4  13.7  10.4   Hemoglobin 12.0 - 15.0 g/dL 16.1  09.6  9.7   Hematocrit 36.0 - 46.0 % 36.0  34.1  31.3   Platelets 150.0 - 400.0 K/uL 380.0  232  218     RUQ US IMPRESSION: Mildly dilated common bile duct, recommend correlation with LFTs for any signs of biliary obstruction. The gallbladder is mildly distended with probable layering echogenic sludge, with minimal wall thickening and a negative sonographic Murphy sign. No shadowing gallstones identified.   MRCP IMPRESSION: 1. Diffuse gallbladder wall edema and mild pericholecystic fluid. Layering debris versus sludge noted within the dependent portion of the gallbladder. Findings are suspicious for acute acalculous cholecystitis. 2. There is fusiform dilatation of the common bile duct which measures up to 1 cm. No signs of choledocholithiasis or obstructing mass. 3.  Aortic Atherosclerosis (ICD10-I70.0    Past  Medical History:  Diagnosis Date   Chronic pain of left lower extremity 07/14/2019   Diabetes mellitus without complication (HCC)    History of COVID-19 09/12/2022   Hyperlipidemia 11/20/2015   Hypertension associated with diabetes (HCC) 01/03/2022   Stage 3a chronic kidney disease (HCC) 06/04/2022    Past Surgical History:  Procedure Laterality Date   CHOLECYSTECTOMY N/A 09/14/2022   Procedure: LAPAROSCOPIC CHOLECYSTECTOMY WITH INTRAOPERATIVE CHOLANGIOGRAM;  Surgeon: Harriette Bouillon, MD;  Location: [facility];  Service: General;  Laterality: N/A;    Current  Medications, Allergies, Family History and Social History were reviewed in [employer] electronic medical record.     Current Outpatient Medications  Medication Sig Dispense Refill   Blood Glucose Calibration (CONTOUR NEXT CONTROL) Normal SOLN 1 drop by In Vitro route as needed. 1 each 2   dapagliflozin propanediol (FARXIGA) 10 MG TABS tablet Take 1 tablet (10 mg total) by mouth daily before breakfast. 90 tablet 3   glipiZIDE (GLUCOTROL) 5 MG tablet Take 1 tablet (5 mg total) by mouth 2 (two) times daily before a meal. 180 tablet 3   lisinopril (ZESTRIL) 20 MG tablet Take 1 tablet (20 mg total) by mouth daily. 90 tablet 3   oxyCODONE (OXY IR/ROXICODONE) 5 MG immediate release tablet Take 1 tablet (5 mg total) by mouth every 4 (four) hours as needed for severe pain. 30 tablet 0   No current facility-administered medications for this visit.    Review of Systems: No chest pain. No shortness of breath. No urinary complaints.    Physical Exam  Wt Readings from Last 3 Encounters:  09/25/22 106 lb 2 oz (48.1 kg)  09/12/22 109 lb 4.8 oz (49.6 kg)  09/03/22 106 lb 8 oz (48.3 kg)    BP 130/62   Pulse 78   Ht 5\' 2"  (1.575 m)   Wt 105 lb (47.6 kg)   BMI 19.20 kg/m  Constitutional:  Pleasant, generally well appearing female in no acute distress. Psychiatric: Normal mood and affect. Behavior is normal. EENT: Pupils normal.  Conjunctivae are normal. No scleral icterus. Neck supple.  Cardiovascular: Normal rate, regular rhythm.  Pulmonary/chest: Effort normal and breath sounds normal. No wheezing, rales or rhonchi. Abdominal: Soft, nondistended, nontender. Bowel sounds active throughout. There are no masses palpable. No hepatomegaly. Neurological: Alert and oriented to person place and time.  Skin: Skin is warm and dry. No rashes noted.  Willette Cluster, NP  11/15/2022, 8:19 AM  Cc:  Georgina Quint, *

## 2022-11-15 NOTE — Patient Instructions (Signed)
_______________________________________________________  If your blood pressure at your visit was 140/90 or greater, please contact your primary care physician to follow up on this. _______________________________________________________  If you are age 69 or older, your body mass index should be between 23-30. Your Body mass index is 19.2 kg/m. If this is out of the aforementioned range listed, please consider follow up with your Primary Care Provider. ________________________________________________________  The [facility] providers would like to encourage you to use [facility] to communicate with providers for non-urgent requests or questions.  Due to long hold times on the telephone, sending your provider a message by [facility] may be a faster and more efficient way to get a response.  Please allow 48 business hours for a response.  Please remember that this is for non-urgent requests.  _______________________________________________________  Your provider has requested that you go to the basement level for lab work before leaving today. Press "B" on the elevator. The lab is located at the first door on the left as you exit the elevator.  Due to recent changes in healthcare laws, you may see the results of your imaging and laboratory studies on MyChart before your provider has had a chance to review them.  We understand that in some cases there may be results that are confusing or concerning to you. Not all laboratory results come back in the same time frame and the provider may be waiting for multiple results in order to interpret others.  Please give [place] 48 hours in order for your provider to thoroughly review all the results before contacting the office for clarification of your results.  Thank you for entrusting me with your care and choosing [facility].  Billey Co, NP

## 2022-11-16 LAB — IRON AND TIBC
Iron Saturation: 31 % (ref 15–55)
Iron: 82 ug/dL (ref 27–139)
Total Iron Binding Capacity: 261 ug/dL (ref 250–450)
UIBC: 179 ug/dL (ref 118–369)

## 2022-11-21 NOTE — Progress Notes (Signed)
____________________________________________________________  Attending physician addendum:  Thank you for sending this case to me. I have reviewed the entire note and agree with the plan.  We checked studies when she was hospitalized, and they were normal as they are now. This appears likely to be thalassemia.  Amada Jupiter, MD  ____________________________________________________________

## 2022-12-03 ENCOUNTER — Ambulatory Visit: Payer: Medicare Other | Admitting: Emergency Medicine

## 2022-12-10 ENCOUNTER — Ambulatory Visit: Payer: Medicare Other | Admitting: Emergency Medicine

## 2022-12-11 ENCOUNTER — Ambulatory Visit (INDEPENDENT_AMBULATORY_CARE_PROVIDER_SITE_OTHER): Payer: Medicare Other | Admitting: Emergency Medicine

## 2022-12-11 ENCOUNTER — Encounter: Payer: Self-pay | Admitting: Emergency Medicine

## 2022-12-11 VITALS — BP 130/74 | HR 70 | Temp 97.9°F | Ht 62.0 in | Wt 110.2 lb

## 2022-12-11 DIAGNOSIS — I152 Hypertension secondary to endocrine disorders: Secondary | ICD-10-CM

## 2022-12-11 DIAGNOSIS — E1169 Type 2 diabetes mellitus with other specified complication: Secondary | ICD-10-CM

## 2022-12-11 DIAGNOSIS — E1159 Type 2 diabetes mellitus with other circulatory complications: Secondary | ICD-10-CM | POA: Diagnosis not present

## 2022-12-11 DIAGNOSIS — Z7984 Long term (current) use of oral hypoglycemic drugs: Secondary | ICD-10-CM

## 2022-12-11 DIAGNOSIS — E785 Hyperlipidemia, unspecified: Secondary | ICD-10-CM

## 2022-12-11 DIAGNOSIS — Z13820 Encounter for screening for osteoporosis: Secondary | ICD-10-CM | POA: Diagnosis not present

## 2022-12-11 DIAGNOSIS — N1831 Chronic kidney disease, stage 3a: Secondary | ICD-10-CM

## 2022-12-11 LAB — POCT GLYCOSYLATED HEMOGLOBIN (HGB A1C): Hemoglobin A1C: 7.8 % — AB (ref 4.0–5.6)

## 2022-12-11 MED ORDER — ROSUVASTATIN CALCIUM 10 MG PO TABS: 10.0000 mg | ORAL_TABLET | Freq: Every day | ORAL | 3 refills | Status: DC

## 2022-12-11 NOTE — Patient Instructions (Signed)
B?nh ti?u ???ng v dinh d??ng, ng??i l?n Diabetes Mellitus and Nutrition, Adult Khi qu v? b? ti?u ???ng hay b?nh ti?u ???ng, ?i?u r?t quan tr?ng l ph?i c thi quen ?n u?ng t?t cho s?c kh?e b?i v nh?ng g qu v? ?n v u?ng ?nh h??ng l?n ??n n?ng ?? ???ng huy?t (glucose) c?a qu v?. ?n th?c ph?m t?t cho s?c kh?e v?i s? l??ng v?a ph?i, vo cng cc th?i ?i?m m?i ngy, c th? gip qu v?: Qu?n l ???ng huy?t. Gi?m nguy c? b? b?nh tim. C?i thi?n huy?t p. ??t ???c ho?c duy tr m??c cn n?ng c l?i cho s?c kh?e. ?i?u g c th? ?nh h??ng ??n k? ho?ch ?n u?ng c?a ti? M?i ng??i b? ti?u ???ng khc nhau v m?i ng??i ??u c nhu c?u v? ch??ng trnh ?n u?ng khc nhau. Chuyn gia ch?m [place] s?c kh?e c?a qu v? c th? [place] qu v? trao ??i v?i chuyn gia dinh d??ng ?? xy d?ng ch??ng trnh ?n u?ng t?t nh?t cho qu v?. Ch??ng trnh ?n u?ng c?a qu v? c th? thay ??i ty thu?c vo cc y?u t? nh?: L??ng calo quy? vi? c?n. Cc lo?i thu?c m quy? vi? du?ng. Cn n??ng cu?a quy? vi?. N?ng ?? ???ng huy?t, huy?t p v cholesterol c?a qu v?. M?c ?? ho?t ??ng c?a qu v?. Cc tnh tr?ng s?c kh?e khc m qu v? c, ch?ng h?n nh? b?nh tim ho?c b?nh th?n. Carbohydrate ?nh h??ng nh? th? no ??n ti? Carbohydrate, hay cn g?i l carb, ?nh h??ng ??n l??ng ???ng huy?t c?a quy? vi? nhi?u h?n b?t k? lo?i th?c ph?m no khc. ?n carb lm t?ng l??ng glucose trong mu. Bi?t l??ng carb m qu v? c th? ?n m?t cch an ton trong m?i b?a ?n c vai tr quan tr?ng. L??ng carbohydrate ny khc nhau v?i m?i ng??i. Chuyn gia dinh d??ng c?a qu v? c th? gip tnh ton l??ng carb m qu v? c?n ph?i ?n vo m?i b?a ?n chnh v m?i b?a ?n nh?. R??u ?nh h??ng ??n ti nh? th? no? R??u c th? gy gi?m ???ng huy?t (h? ???ng huy?t), ??c bi?t l n?u qu v? s? d?ng insulin ho?c u?ng m?t s? lo?i thu?c nh?t ??nh ?i?u tr? ti?u ???ng. H? ???ng huy?t c th? l m?t tnh tr?ng ?e d?a tnh [place]?ng. Cc tri?u ch?ng c?a h? ???ng huy?t, ch?ng h?n bu?n ng?, chng  m?t v l l?n, t??ng t? cc tri?u ch?ng c?a vi?c u?ng qu nhi?u r??u. Khng u?ng r??u n?u: Chuyn gia ch?m [place] s?c kh?e khuyn qu v? khng u?ng r??u. Qu v? c [place], c th? c [place], ho?c c k? ho?ch c [place]. N?u qu v? u?ng r??u: Gi?i h?n l??ng r??u qu v? u?ng ? m?c: 0-1 ly/ngy ??i v?i n? gi?i. 0-2 ly/ngy ??i v?i nam gi?i. Bi?t m?t ly c bao nhiu r??u. ? M?, m?t ly t??ng ???ng v?i m?t chai bia 12 ao-x? (355 mL), m?t ly r??u vang 5 ao-x? (148 mL), ho?c m?t ly r??u m?nh 1 ao-x? (44 mL). Lun b ?? n??c b?ng n??c, soda dnh cho ng??i ?n [place], ho?c tr ? khng ???ng. Tamera Punt nh? r?ng soda thng th???ng, n??c tri cy v ca?c ?? u?ng h?n h??p khc c th? ch?a nhi?u ???ng v ph?i ???c tnh l carb. C nh?ng l?i khuyn no ?? tun th? k? ho?ch ny?  ??c nhn th?c ph?m B?t ??u b?ng vi?c ki?m tra kch c? kh?u ph?n trn nhn Thng tin dinh d??ng c?a th?c  ph?m v ?? u?ng ?ng gi. S? calo v l??ng carb, ch?t bo v cc ch?t dinh d??ng khc ghi trn nhn d?a trn m?t kh?u ph?n c?a mn ?. Nhi?u mn ch?a nhi?u h?n m?t kh?u ph?n trn m?i bao b. Ki?m tra t?ng s? gram (g) carb trong m?t kh?u ph?n. Ki?m tra s? gram ch?t bo bo ha v ch?t bo chuy?n ha trong m?t kh?u ph?n. Ch?n th?c ph?m c t ho?c khng c nh?ng ch?t bo ny. Ki?m tra s? miligam (mg) mu?i (natri) trong m?t kh?u ph?n. H?u h?t m?i ng??i ??u nn gi?i h?n t?ng natri tiu thu? ?? m??c d??i 2.300 mg m?i ngy. Lun ki?m tra thng tin dinh d??ng c?a th?c ph?m ghi trn nhn l "t bo" hay "khng bo". Nh?ng th?c ph?m ny c th? c b? sung ???ng ho?c carb tinh luy?n cao h?n v c?n ph?i trnh. Hy trao ??i v?i chuyn gia dinh d??ng c?a qu v? ?? xc ??nh m?c tiu hng ngy c?a qu v? ??i v?i cc ch?t dinh d??ng li?t k trn nhn. Mua s?m Trnh mua nh?ng th?c ph?m ?ng h?p, lm s?n ho?c ch? bi?n s?n. Nh?ng th?c ph?m ny c xu h??ng c nhi?u ch?t bo, natri, b? sung ???ng. Mua s?m quanh ra ngoi c?a c?a hng t?p ha. ?y l n?i qu v? s? d?  tm th?y tri cy v rau c? t??i, nhi?u lo?i h?t, th?t t??i v s?n ph?m s?a t??i. N?u n??ng S? d?ng ca?c ph??ng php n?u ?n nhi?t ?? th?p, ch?ng h?n nh? n??ng, thay v ph??ng php n?u nhi?t ?? cao nh? chin ng?p d?u. N?u ?n b?ng cch s? d?ng cc lo?i d?u t?t cho s?c kh?e, ch?ng h?n nh? d?u  liu, d?u h?t c?i d?u ho?c d?u h??ng d??ng. Trnh n?u v?i b?, kem, ho?c th?t nhi?u m?. Ln k? ho?ch cho b?a ?n ?n cc b?a ?n chnh v cc b?a ?n nh? ??u ??n, t?t nh?t l vo cng m?t th?i ?i?m m?i ngy. Trnh nhi?n ?n trong th?i gian di. ?n th?c ?n giu ch?t x? nh? cc lo?i tri cy t??i, rau c?, ??u v ng? c?c nguyn cm. ?n 4-6 ao-x? (oz) (112-168 g) protein n?c m?i ngy, ch?ng h?n nh? th?t n?c, th?t g, c, tr?ng, ho?c ??u ph?. M?t ao-x? (oz) (28 g) protein n?c b?ng: 1 ao-x? (oz) (28 g) th?t, th?t g ho?c c. 1 qu? tr?ng. 1?4 c?c (62 g) ??u ph?. ?n m?t s? th?c ph?m ch?a cc ch?t bo lnh m?nh m?i ngy, ch?ng h?n nh? qu? b?, qu? h?ch, cc lo?i h?t v c. Ti nn ?n nh?ng th?c ph?m no? Tri cy Qu? m?ng. To. Cam. Qu? ?o. Qu? m?. Qu? m?n. Nho. Xoi. Qu? ?u ??. Qu? l?u. Qu? kiwi. Qu? anh ?o. Rau c? Rau xanh, bao g?m c?i xo?n, rau bina, kale, c?i c?u v?ng, c?i r? c?i b? xanh v c?i b?p. C? c?i ???ng. Sp l?. Bng c?i xanh. C r?t. ??u xanh. C chua. ?t ng?t. Hnh ty. D?a chu?t. C?i bruxen. Ng? c?c Ng? c?c nguyn ca?m nh? bnh m nguyn ca?m ho??c nguyn h?t, bnh quy gin, bnh m ng, ng? c?c v m ?ng. B?t y?n m?ch khng ???ng. H?t dim m?ch (quinoa). G?o l?c ho?c g?o khng xt. Th?t v cc protein khc H?i s?n. Th?t gia c?m khng da. Mi?ng n?c th?t gia c?m v th?t b. ??u ph?. Qu? h?ch. Cc lo?i h?t. S?a Cc s?n ph?m s??a khng co? ho??c co? i?t ch?t be?o, ch?ng h?n nh? s?a, s??a   chua va? pho mt. Nh?ng th?c ph?m li?t k ? trn c th? khng ph?i l m?t danh m?c ??y ?? cc th?c ph?m v ?? u?ng m qu v? c th? ?n v u?ng. Lin h? v?i chuyn gia dinh d??ng ?? c thm thng tin. Ti nn trnh  nh?ng th?c ph?m no? Tri cy Tri cy ?ng h?p xi-r. Derrek Gu c? Derrek Gu c? ?ng h?p. Rau ?ng l?nh v?i b? ho?c n??c s?t kem. Ng? c?c Cc s?n ph?m lm t? b?t m tr?ng v b?t m ???c tinh ch? nh? bnh m, m ?ng, ?? ?n nh? v ng? c?c. Trnh t?t c? cc th?c ph?m ch? bi?n s?n. Th?t v cc protein khc Cc la?t thi?t m??. Th?t gia c?m c da. Th?t chin/rn ho?c t?m b?t rn. Th?t ch? bi?n s?n. Trnh cc ch?t bo bo ha. S?a S?a chua nguyn ch?t bo, pho mt ho?c s?a. ?? u?ng ?? u?ng c ???ng, ch?ng h?n nh? soda ho?c tr ?Marland Kitchen Nh?ng th?c ph?m li?t k ? trn c th? khng ph?i l m?t danh m?c ??y ?? cc lo?i th?c ph?m v ?? u?ng m qu v? nn trnh. Lin h? v?i chuyn gia dinh d??ng ?? c thm thng tin. Ca?c cu h?i c?n ??a ra v?i chuyn gia ch?m [place] s?c kh?e Ti c c?n g?p chuyn gia gio d?c v ch?m [place] b?nh ti?u ???ng c ch?ng nh?n khng? Ti c c?n g?p chuyn gia dinh d??ng khng? Ti co? th? go?i cho s? na?o n?u ti co? th?c m?c? Khi no l th?i ?i?m ki?m tra ???ng huy?t t?t nh?t? N?i ?? tm thm thng tin: American Diabetes Association (Hi?p h?i Ti?u ????ng [place] K?): diabetes.org Academy of Nutrition and Dietetics (H?c vi?n Dinh d??ng v ?n u?ng): eatright.[employer] of Diabetes and Digestive and Kidney Diseases (Vi?n Ti?u ???ng v cc B?nh Tiu ha v B?nh Th?n Qu?c gia): [employer]Sync.si Association of Diabetes Care & Education Specialists (Hi?p h?i Chuyn gia Gio d?c v Ch?m [place] B?nh ti?u ???ng): [email or web address].org Tm t?t ?i?u r?t quan tr?ng l ph?i c thi quen ?n u?ng t?t cho s?c kh?e b?i v nh?ng g qu v? ?n v u?ng ?nh h??ng l?n ??n n?ng ?? ???ng huy?t (glucose) c?a qu v?. ?i?u quan tr?ng l s? d?ng m?t r??u cch th?n tr?ng. M?t k? ho?ch ?n u?ng t?t cho s?c kh?e s? gip qu v? ki?m sot ???ng huy?t v gi?m nguy c? m?c b?nh tim. Chuyn gia ch?m [place] s?c kh?e c?a qu v? c th? [place] qu v? trao ??i v?i chuyn gia dinh d??ng ?? xy d?ng ch??ng trnh ?n u?ng t?t nh?t cho qu  v?. Thng tin ny khng nh?m m?c ?ch thay th? cho l?i khuyn m chuyn gia ch?m [place] s?c kh?e ni v?i qu v?. Hy b?o ??m qu v? ph?i th?o lu?n b?t k? v?n ?? g m qu v? c v?i chuyn gia ch?m [place] s?c kh?e c?a qu v?. Document Revised: 11/16/2020 Document Reviewed: 11/16/2020 Elsevier Patient Education  2024 [employer].

## 2022-12-11 NOTE — Assessment & Plan Note (Signed)
BP Readings from Last 3 Encounters:  12/11/22 130/74  11/15/22 130/62  09/25/22 100/64  Well-controlled hypertension. Continue lisinopril 20 mg daily Hemoglobin A1c still not at goal at 7.8. Diet and nutrition discussed. Cardiovascular risks associated with uncontrolled diabetes discussed Recommend to continue glipizide 5 mg twice a day and Farxiga 10 mg daily Will follow-up in 3 to 6 months.

## 2022-12-11 NOTE — Assessment & Plan Note (Signed)
Chronic stable condition. Diet and nutrition discussed. Restart rosuvastatin 10 mg daily The 10-year ASCVD risk score (Arnett DK, et al., 2019) is: 23.7%   Values used to calculate the score:     Age: 67 years     Sex: Female     Is Non-Hispanic African American: No     Diabetic: Yes     Tobacco smoker: No     Systolic Blood Pressure: 130 mmHg     Is BP treated: Yes     HDL Cholesterol: 34.2 mg/dL     Total Cholesterol: 249 mg/dL

## 2022-12-11 NOTE — Assessment & Plan Note (Signed)
Advised to stay well-hydrated and avoid NSAIDs. Continue Farxiga 10 mg daily 

## 2022-12-11 NOTE — Progress Notes (Signed)
Cathy Robbins 67 y.o.   Chief Complaint  Patient presents with   Medical Management of Chronic Issues    f/u ppt, no concerns     HISTORY OF PRESENT ILLNESS: This is a 67 y.o. female accompanied by granddaughter, here for 79-month follow-up of diabetes and hypertension Doing well.  Has no complaints or medical concerns today.  Lab Results  Component Value Date   HGBA1C 7.9 (H) 09/25/2022   Wt Readings from Last 3 Encounters:  12/11/22 110 lb 4 oz (50 kg)  11/15/22 105 lb (47.6 kg)  09/25/22 106 lb 2 oz (48.1 kg)   BP Readings from Last 3 Encounters:  12/11/22 130/74  11/15/22 130/62  09/25/22 100/64     HPI   Prior to Admission medications   Medication Sig Start Date End Date Taking? Authorizing Provider  Blood Glucose Calibration (CONTOUR NEXT CONTROL) Normal SOLN 1 drop by In Vitro route as needed. 12/19/17  Yes McVey, Madelaine Bhat, PA-C  dapagliflozin propanediol (FARXIGA) 10 MG TABS tablet Take 1 tablet (10 mg total) by mouth daily before breakfast. 02/26/22 02/21/23 Yes Naja Apperson, Eilleen Kempf, MD  glipiZIDE (GLUCOTROL) 5 MG tablet Take 1 tablet (5 mg total) by mouth 2 (two) times daily before a meal. 06/04/22 05/30/23 Yes Kairos Panetta, Eilleen Kempf, MD  lisinopril (ZESTRIL) 20 MG tablet Take 1 tablet (20 mg total) by mouth daily. 01/29/22  Yes [place] Warriner, Eilleen Kempf, MD  oxyCODONE (OXY IR/ROXICODONE) 5 MG immediate release tablet Take 1 tablet (5 mg total) by mouth every 4 (four) hours as needed for severe pain. Patient not taking: Reported on 11/15/2022 09/15/22   Osvaldo Shipper, MD    No Known Allergies  Patient Active Problem List   Diagnosis Date Noted   Transaminitis 09/12/2022   Cholelithiasis 09/12/2022   Non-English speaking patient (Vietnamese) 09/12/2022   Anemia 09/12/2022   Stage 3a chronic kidney disease (HCC) 06/04/2022   Uncontrolled type 2 diabetes mellitus with hyperglycemia (HCC) 01/03/2022   Hypertension associated with diabetes (HCC) 01/03/2022    Chronic pain of left lower extremity 07/14/2019   Positive ANA (antinuclear antibody) 07/14/2019   Dyslipidemia associated with type 2 diabetes mellitus (HCC) 11/20/2015   Hyperlipidemia 11/20/2015    Past Medical History:  Diagnosis Date   Chronic pain of left lower extremity 07/14/2019   Diabetes mellitus without complication (HCC)    History of COVID-19 09/12/2022   Hyperlipidemia 11/20/2015   Hypertension associated with diabetes (HCC) 01/03/2022   Stage 3a chronic kidney disease (HCC) 06/04/2022    Past Surgical History:  Procedure Laterality Date   CHOLECYSTECTOMY N/A 09/14/2022   Procedure: LAPAROSCOPIC CHOLECYSTECTOMY WITH INTRAOPERATIVE CHOLANGIOGRAM;  Surgeon: Harriette Bouillon, MD;  Location: [facility];  Service: General;  Laterality: N/A;    Social History   Socioeconomic History   Marital status: Married    Spouse name: Not on file   Number of children: 4   Years of education: Not on file   Highest education level: Not on file  Occupational History   Not on file  Tobacco Use   Smoking status: Never   Smokeless tobacco: Never  Vaping Use   Vaping Use: Never used  Substance and Sexual Activity   Alcohol use: No   Drug use: No   Sexual activity: Not Currently  Other Topics Concern   Not on file  Social History Narrative   Not on file   Social Determinants of Health   Financial Resource Strain: Low Risk  (01/12/2019)   Overall Financial  Resource Strain (CARDIA)    Difficulty of Paying Living Expenses: Not hard at all  Food Insecurity: No Food Insecurity (09/13/2022)   Hunger Vital Sign    Worried About Running Out of Food in the Last Year: Never true    Ran Out of Food in the Last Year: Never true  Transportation Needs: No Transportation Needs (09/13/2022)   PRAPARE - Administrator, Civil Service (Medical): No    Lack of Transportation (Non-Medical): No  Physical Activity: Sufficiently Active (01/12/2019)   Exercise Vital Sign    Days of  Exercise per Week: 5 days    Minutes of Exercise per Session: 30 min  Stress: No Stress Concern Present (01/12/2019)   [employer] of Occupational Health - Occupational Stress Questionnaire    Feeling of Stress : Not at all  Social Connections: Moderately Isolated (01/12/2019)   Social Connection and Isolation Panel [NHANES]    Frequency of Communication with Friends and Family: Twice a week    Frequency of Social Gatherings with Friends and Family: Once a week    Attends Religious Services: Never    Database administrator or Organizations: No    Attends Banker Meetings: Never    Marital Status: Married  Catering manager Violence: Not At Risk (09/13/2022)   Humiliation, Afraid, Rape, and Kick questionnaire    Fear of Current or Ex-Partner: No    Emotionally Abused: No    Physically Abused: No    Sexually Abused: No    No family history on file.   Review of Systems  Constitutional: Negative.  Negative for chills and fever.  HENT: Negative.  Negative for congestion.   Respiratory: Negative.  Negative for cough and shortness of breath.   Cardiovascular: Negative.  Negative for chest pain and palpitations.  Gastrointestinal:  Negative for abdominal pain, diarrhea, nausea and vomiting.  Genitourinary: Negative.  Negative for dysuria and hematuria.  Skin: Negative.  Negative for rash.  Neurological: Negative.  Negative for dizziness and headaches.  All other systems reviewed and are negative.   Vitals:   12/11/22 1059  BP: 130/74  Pulse: 70  Temp: 97.9 F (36.6 C)  SpO2: 98%    Physical Exam Vitals reviewed.  Constitutional:      Appearance: Normal appearance.  HENT:     Head: Normocephalic.     Mouth/Throat:     Mouth: Mucous membranes are moist.     Pharynx: Oropharynx is clear.  Eyes:     Extraocular Movements: Extraocular movements intact.     Pupils: Pupils are equal, round, and reactive to light.  Cardiovascular:     Rate and Rhythm: Normal  rate and regular rhythm.     Pulses: Normal pulses.     Heart sounds: Normal heart sounds.  Pulmonary:     Effort: Pulmonary effort is normal.  Abdominal:     Palpations: Abdomen is soft.     Tenderness: There is no abdominal tenderness.  Musculoskeletal:     Cervical back: No tenderness.  Lymphadenopathy:     Cervical: No cervical adenopathy.  Skin:    General: Skin is warm and dry.     Capillary Refill: Capillary refill takes less than 2 seconds.  Neurological:     General: No focal deficit present.     Mental Status: She is alert and oriented to person, place, and time.  Psychiatric:        Mood and Affect: Mood normal.  Behavior: Behavior normal.    Results for orders placed or performed in visit on 12/11/22 (from the past 24 hour(s))  POCT HgB A1C     Status: Abnormal   Collection Time: 12/11/22 11:10 AM  Result Value Ref Range   Hemoglobin A1C 7.8 (A) 4.0 - 5.6 %   HbA1c POC (<> result, manual entry)     HbA1c, POC (prediabetic range)     HbA1c, POC (controlled diabetic range)       ASSESSMENT & PLAN: A total of 46 minutes was spent with the patient and counseling/coordination of care regarding preparing for this visit, review of most recent office visit notes, review of most recent blood work results including interpretation of today's hemoglobin A1c, cardiovascular risks associated with uncontrolled diabetes, education on nutrition, review of chronic medical conditions under management, prognosis, documentation, and need for follow-up.  Problem List Items Addressed This Visit       Cardiovascular and Mediastinum   Hypertension associated with diabetes (HCC) - Primary    BP Readings from Last 3 Encounters:  12/11/22 130/74  11/15/22 130/62  09/25/22 100/64  Well-controlled hypertension. Continue lisinopril 20 mg daily Hemoglobin A1c still not at goal at 7.8. Diet and nutrition discussed. Cardiovascular risks associated with uncontrolled diabetes  discussed Recommend to continue glipizide 5 mg twice a day and Farxiga 10 mg daily Will follow-up in 3 to 6 months.       Relevant Medications   rosuvastatin (CRESTOR) 10 MG tablet   Other Relevant Orders   POCT HgB A1C (Completed)     Endocrine   Dyslipidemia associated with type 2 diabetes mellitus (HCC)    Chronic stable condition. Diet and nutrition discussed. Restart rosuvastatin 10 mg daily The 10-year ASCVD risk score (Arnett DK, et al., 2019) is: 23.7%   Values used to calculate the score:     Age: 15 years     Sex: Female     Is Non-Hispanic African American: No     Diabetic: Yes     Tobacco smoker: No     Systolic Blood Pressure: 130 mmHg     Is BP treated: Yes     HDL Cholesterol: 34.2 mg/dL     Total Cholesterol: 249 mg/dL       Relevant Medications   rosuvastatin (CRESTOR) 10 MG tablet   Other Relevant Orders   POCT HgB A1C (Completed)     Genitourinary   Stage 3a chronic kidney disease (HCC)    Advised to stay well-hydrated and avoid NSAIDs Continue Farxiga 10 mg daily      Other Visit Diagnoses     Osteoporosis screening       Relevant Orders   DG Bone Density      Patient Instructions  B?nh ti?u ???ng v dinh d??ng, ng??i l?n Diabetes Mellitus and Nutrition, Adult Khi qu v? b? ti?u ???ng hay b?nh ti?u ???ng, ?i?u r?t quan tr?ng l ph?i c thi quen ?n u?ng t?t cho s?c kh?e b?i v nh?ng g qu v? ?n v u?ng ?nh h??ng l?n ??n n?ng ?? ???ng huy?t (glucose) c?a qu v?. ?n th?c ph?m t?t cho s?c kh?e v?i s? l??ng v?a ph?i, vo cng cc th?i ?i?m m?i ngy, c th? gip qu v?: Qu?n l ???ng huy?t. Gi?m nguy c? b? b?nh tim. C?i thi?n huy?t p. ??t ???c ho?c duy tr m??c cn n?ng c l?i cho s?c kh?e. ?i?u g c th? ?nh h??ng ??n k? ho?ch ?n u?ng c?a ti? M?i ng??i b? ti?u ???  ng khc nhau v m?i ng??i ??u c nhu c?u v? ch??ng trnh ?n u?ng khc nhau. Chuyn gia ch?m [place] s?c kh?e c?a qu v? c th? [place] qu v? trao ??i v?i chuyn gia dinh d??ng ?? xy  d?ng ch??ng trnh ?n u?ng t?t nh?t cho qu v?. Ch??ng trnh ?n u?ng c?a qu v? c th? thay ??i ty thu?c vo cc y?u t? nh?: L??ng calo quy? vi? c?n. Cc lo?i thu?c m quy? vi? du?ng. Cn n??ng cu?a quy? vi?. N?ng ?? ???ng huy?t, huy?t p v cholesterol c?a qu v?. M?c ?? ho?t ??ng c?a qu v?. Cc tnh tr?ng s?c kh?e khc m qu v? c, ch?ng h?n nh? b?nh tim ho?c b?nh th?n. Carbohydrate ?nh h??ng nh? th? no ??n ti? Carbohydrate, hay cn g?i l carb, ?nh h??ng ??n l??ng ???ng huy?t c?a quy? vi? nhi?u h?n b?t k? lo?i th?c ph?m no khc. ?n carb lm t?ng l??ng glucose trong mu. Bi?t l??ng carb m qu v? c th? ?n m?t cch an ton trong m?i b?a ?n c vai tr quan tr?ng. L??ng carbohydrate ny khc nhau v?i m?i ng??i. Chuyn gia dinh d??ng c?a qu v? c th? gip tnh ton l??ng carb m qu v? c?n ph?i ?n vo m?i b?a ?n chnh v m?i b?a ?n nh?. R??u ?nh h??ng ??n ti nh? th? no? R??u c th? gy gi?m ???ng huy?t (h? ???ng huy?t), ??c bi?t l n?u qu v? s? d?ng insulin ho?c u?ng m?t s? lo?i thu?c nh?t ??nh ?i?u tr? ti?u ???ng. H? ???ng huy?t c th? l m?t tnh tr?ng ?e d?a tnh [place]?ng. Cc tri?u ch?ng c?a h? ???ng huy?t, ch?ng h?n bu?n ng?, chng m?t v l l?n, t??ng t? cc tri?u ch?ng c?a vi?c u?ng qu nhi?u r??u. Khng u?ng r??u n?u: Chuyn gia ch?m [place] s?c kh?e khuyn qu v? khng u?ng r??u. Qu v? c [place], c th? c [place], ho?c c k? ho?ch c [place]. N?u qu v? u?ng r??u: Gi?i h?n l??ng r??u qu v? u?ng ? m?c: 0-1 ly/ngy ??i v?i n? gi?i. 0-2 ly/ngy ??i v?i nam gi?i. Bi?t m?t ly c bao nhiu r??u. ? M?, m?t ly t??ng ???ng v?i m?t chai bia 12 ao-x? (355 mL), m?t ly r??u vang 5 ao-x? (148 mL), ho?c m?t ly r??u m?nh 1 ao-x? (44 mL). Lun b ?? n??c b?ng n??c, soda dnh cho ng??i ?n king, ho?c tr ? khng ???ng. Tamera Punt nh? r?ng soda thng th???ng, n??c tri cy v ca?c ?? u?ng h?n h??p khc c th? ch?a nhi?u ???ng v ph?i ???c tnh l carb. C nh?ng l?i khuyn no ?? tun th? k? ho?ch ny?  ??c  nhn th?c ph?m B?t ??u b?ng vi?c ki?m tra kch c? kh?u ph?n trn nhn Thng tin dinh d??ng c?a th?c ph?m v ?? u?ng ?ng gi. S? calo v l??ng carb, ch?t bo v cc ch?t dinh d??ng khc ghi trn nhn d?a trn m?t kh?u ph?n c?a mn ?. Nhi?u mn ch?a nhi?u h?n m?t kh?u ph?n trn m?i bao b. Ki?m tra t?ng s? gram (g) carb trong m?t kh?u ph?n. Ki?m tra s? gram ch?t bo bo ha v ch?t bo chuy?n ha trong m?t kh?u ph?n. Ch?n th?c ph?m c t ho?c khng c nh?ng ch?t bo ny. Ki?m tra s? miligam (mg) mu?i (natri) trong m?t kh?u ph?n. H?u h?t m?i ng??i ??u nn gi?i h?n t?ng natri tiu thu? ?? m??c d??i 2.300 mg m?i ngy. Lun ki?m tra thng tin dinh d??ng c?a th?c ph?m ghi trn nhn l "  t bo" hay "khng bo". Nh?ng th?c ph?m ny c th? c b? sung ???ng ho?c carb tinh luy?n cao h?n v c?n ph?i trnh. Hy trao ??i v?i chuyn gia dinh d??ng c?a qu v? ?? xc ??nh m?c tiu hng ngy c?a qu v? ??i v?i cc ch?t dinh d??ng li?t k trn nhn. Mua s?m Trnh mua nh?ng th?c ph?m ?ng h?p, lm s?n ho?c ch? bi?n s?n. Nh?ng th?c ph?m ny c xu h??ng c nhi?u ch?t bo, natri, b? sung ???ng. Mua s?m quanh ra ngoi c?a c?a hng t?p ha. ?y l n?i qu v? s? d? tm th?y tri cy v rau c? t??i, nhi?u lo?i h?t, th?t t??i v s?n ph?m s?a t??i. N?u n??ng S? d?ng ca?c ph??ng php n?u ?n nhi?t ?? th?p, ch?ng h?n nh? n??ng, thay v ph??ng php n?u nhi?t ?? cao nh? chin ng?p d?u. N?u ?n b?ng cch s? d?ng cc lo?i d?u t?t cho s?c kh?e, ch?ng h?n nh? d?u  liu, d?u h?t c?i d?u ho?c d?u h??ng d??ng. [place] n?u v?i b?, kem, ho?c th?t nhi?u m?. Ln k? ho?ch cho b?a ?n ?n cc b?a ?n chnh v cc b?a ?n nh? ??u ??n, t?t nh?t l vo cng m?t th?i ?i?m m?i ngy. Trnh nhi?n ?n trong th?i [employer]. ?n th?c ?n giu ch?t x? nh? cc lo?i tri cy t??i, rau c?, ??u v ng? c?c nguyn cm. ?n 4-6 ao-x? (oz) (112-168 g) protein n?c m?i ngy, ch?ng h?n nh? th?t n?c, th?t g, c, tr?ng, ho?c ??u ph?. M?t ao-x? (oz) (28 g) protein n?c  b?ng: 1 ao-x? (oz) (28 g) th?t, th?t g ho?c c. 1 qu? tr?ng. 1?4 c?c (62 g) ??u ph?. ?n m?t s? th?c ph?m ch?a cc ch?t bo lnh m?nh m?i ngy, ch?ng h?n nh? qu? b?, qu? h?ch, cc lo?i h?t v c. Ti nn ?n nh?ng th?c ph?m no? Tri cy Qu? m?ng. To. [place]? ?o. Qu? m?. Qu? m?n. Nho. Xoi. Qu? ?u ??[place]? l?u. Qu? kiwi. Qu? anh ?o. Derrek Gu c? Ferne Coe, bao g?m c?i xo?n, rau bina, kale, c?i c?u v?ng, c?i r? c?i b? xanh v c?i b?p. C? c?i ???ng. Sp l?. Bng c?i xanh. C r?t. ??u xanh. C chua. ?t ng?t. Hnh ty. D?a chu?t. C?i bruxen. Ng? c?c Ng? c?c nguyn ca?m nh? bnh m nguyn ca?m ho??c nguyn h?t, bnh quy gin, bnh m ng, ng? c?c v m ?ng. B?t y?n m?ch khng ???ng. H?t dim m?ch (quinoa). G?o l?c ho?c g?o khng xt. Th?t v cc protein khc H?i s?n. Th?t gia c?m khng da. Mi?ng n?c th?t gia c?m v th?t b. ??u ph?. Qu? h?ch. Cc lo?i h?t. S?a Cc s?n ph?m s??a khng co? ho??c co? i?t ch?t be?o, ch?ng h?n nh? s?a, s??a chua va? pho mt. Nh?ng th?c ph?m li?t k ? trn c th? khng ph?i l m?t danh m?c ??y ?? cc th?c ph?m v ?? u?ng m qu v? c th? ?n v u?ng. Lin h? v?i chuyn gia dinh d??ng ?? c thm thng tin. Ti nn trnh nh?ng th?c ph?m no? Tri cy Tri cy ?ng h?p xi-r. Derrek Gu c? Derrek Gu c? ?ng h?p. Rau ?ng l?nh v?i b? ho?c n??c s?t kem. Ng? c?c Cc s?n ph?m lm t? b?t m tr?ng v b?t m ???c tinh ch? nh? bnh m, m ?ng, ?? ?n nh? v ng? c?c. Trnh t?t c? cc th?c ph?m ch? bi?n s?n. Th?t v cc protein khc Cc la?t thi?t m??. Th?t gia c?m c da.  Th?t chin/rn ho?c t?m b?t rn. Th?t ch? bi?n s?n. Trnh cc ch?t bo bo ha. S?a S?a chua nguyn ch?t bo, pho mt ho?c s?a. ?? u?ng ?? u?ng c ???ng, ch?ng h?n nh? soda ho?c tr ?Marland Kitchen Nh?ng th?c ph?m li?t k ? trn c th? khng ph?i l m?t danh m?c ??y ?? cc lo?i th?c ph?m v ?? u?ng m qu v? nn trnh. Lin h? v?i chuyn gia dinh d??ng ?? c thm thng tin. Ca?c cu h?i c?n ??a ra v?i chuyn gia ch?m [place] s?c kh?e Ti c  c?n g?p chuyn gia gio d?c v ch?m [place] b?nh ti?u ???ng c ch?ng nh?n khng? Ti c c?n g?p chuyn gia dinh d??ng khng? Ti co? th? go?i cho s? na?o n?u ti co? th?c m?c? Khi no l th?i ?i?m ki?m tra ???ng huy?t t?t nh?t? N?i ?? tm thm thng tin: American Diabetes Association (Hi?p h?i Ti?u ????ng Ho[place] K?): diabetes.org Academy of Nutrition and Dietetics (H?c vi?n Dinh d??ng v ?n u?ng): eatright.[employer] of Diabetes and Digestive and Kidney Diseases (Vi?n Ti?u ???ng v cc B?nh Tiu ha v B?nh Th?n Qu?c gia): [email or web address].si Association of Diabetes Care & Education Specialists (Hi?p h?i Chuyn gia Gio d?c v Ch?m [place] B?nh ti?u ???ng): [email or web address].org Tm t?t ?i?u r?t quan tr?ng l ph?i c thi quen ?n u?ng t?t cho s?c kh?e b?i v nh?ng g qu v? ?n v u?ng ?nh h??ng l?n ??n n?ng ?? ???ng huy?t (glucose) c?a qu v?. ?i?u quan tr?ng l s? d?ng m?t r??u cch th?n tr?ng. M?t k? ho?ch ?n u?ng t?t cho s?c kh?e s? gip qu v? ki?m sot ???ng huy?t v gi?m nguy c? m?c b?nh tim. Chuyn gia ch?m [place] s?c kh?e c?a qu v? c th? [place] qu v? trao ??i v?i chuyn gia dinh d??ng ?? xy d?ng ch??ng trnh ?n u?ng t?t nh?t cho qu v?. Thng tin ny khng nh?m m?c ?ch thay th? cho l?i khuyn m chuyn gia ch?m [place] s?c kh?e ni v?i qu v?. Hy b?o ??m qu v? ph?i th?o lu?n b?t k? v?n ?? g m qu v? c v?i chuyn gia ch?m [place] s?c kh?e c?a qu v?. Document Revised: 11/16/2020 Document Reviewed: 11/16/2020 Elsevier Patient Education  2024 Elsevier Inc.      Edwina Barth, MD [facility]

## 2023-02-22 ENCOUNTER — Other Ambulatory Visit: Payer: Self-pay | Admitting: Emergency Medicine

## 2023-02-22 DIAGNOSIS — I152 Hypertension secondary to endocrine disorders: Secondary | ICD-10-CM

## 2023-04-02 ENCOUNTER — Other Ambulatory Visit: Payer: Self-pay | Admitting: Emergency Medicine

## 2023-04-02 DIAGNOSIS — I152 Hypertension secondary to endocrine disorders: Secondary | ICD-10-CM

## 2023-04-04 ENCOUNTER — Telehealth: Payer: Self-pay | Admitting: Emergency Medicine

## 2023-04-04 NOTE — Telephone Encounter (Signed)
Called patient daughter to clarify medication she is suppose to be taking

## 2023-04-04 NOTE — Telephone Encounter (Signed)
Patient's daughter Stelmach called and said she would like to confirm which medications her mother needs to be taking. She said there was some confusion about what she is supposed to be on. Best callback for [place] is [phone].

## 2023-05-06 ENCOUNTER — Ambulatory Visit (HOSPITAL_BASED_OUTPATIENT_CLINIC_OR_DEPARTMENT_OTHER): Payer: Medicare Other | Admitting: Internal Medicine

## 2023-05-06 ENCOUNTER — Encounter (HOSPITAL_BASED_OUTPATIENT_CLINIC_OR_DEPARTMENT_OTHER): Payer: Self-pay | Admitting: Internal Medicine

## 2023-05-06 VITALS — BP 112/50 | HR 80 | Ht 62.0 in | Wt 107.0 lb

## 2023-05-06 DIAGNOSIS — I708 Atherosclerosis of other arteries: Secondary | ICD-10-CM

## 2023-05-06 DIAGNOSIS — E1169 Type 2 diabetes mellitus with other specified complication: Secondary | ICD-10-CM

## 2023-05-06 DIAGNOSIS — I700 Atherosclerosis of aorta: Secondary | ICD-10-CM

## 2023-05-06 DIAGNOSIS — E781 Pure hyperglyceridemia: Secondary | ICD-10-CM | POA: Diagnosis not present

## 2023-05-06 DIAGNOSIS — E785 Hyperlipidemia, unspecified: Secondary | ICD-10-CM | POA: Diagnosis not present

## 2023-05-06 DIAGNOSIS — I10 Essential (primary) hypertension: Secondary | ICD-10-CM | POA: Diagnosis not present

## 2023-05-06 MED ORDER — ROSUVASTATIN CALCIUM 20 MG PO TABS: 20.0000 mg | ORAL_TABLET | Freq: Every day | ORAL | 3 refills | Status: AC

## 2023-05-06 NOTE — Patient Instructions (Signed)
Medication Instructions:  INCREASE rosuvastatin to 20mg  daily  *If you need a refill on your cardiac medications before your next appointment, please call your pharmacy*   Lab Work: FASTING lab work in 4 months to check cholesterol Lipid Panel, Direct LDL, LPa Complete this about 1 week before next appointment   If you have labs (blood work) drawn today and your tests are completely normal, you will receive your results only by: MyChart Message (if you have MyChart) OR A paper copy in the mail If you have any lab test that is abnormal or we need to change your treatment, we will call you to review the results.   Follow-Up: At [facility], you and your health needs are our priority.  As part of our continuing mission to provide you with exceptional heart care, we have created designated Provider Care Teams.  These Care Teams include your primary Cardiologist (physician) and Advanced Practice Providers (APPs -  Physician Assistants and Nurse Practitioners) who all work together to provide you with the care you need, when you need it.  We recommend signing up for the patient portal called "MyChart".  Sign up information is provided on this After Visit Summary.  MyChart is used to connect with patients for Virtual Visits (Telemedicine).  Patients are able to view lab/test results, encounter notes, upcoming appointments, etc.  Non-urgent messages can be sent to your provider as well.   To learn more about what you can do with MyChart, go to [email or web address].    Your next appointment:   4 months with Dr. Rennis Golden

## 2023-05-06 NOTE — Progress Notes (Signed)
LIPID CLINIC CONSULT NOTE  Chief Complaint:  Manage dyslipidemia  Primary Care Physician: Georgina Quint, MD  Primary Cardiologist:  None  HPI:  Cathy Robbins is a 67 y.o. female who is being seen today for the evaluation of dyslipidemia at the request of Georgina Quint, *.  This is a pleasant 67 year old [place] female who is seen today with a Adult nurse and her daughter who is [place] speaking.  She has a history of high triglycerides and was referred for evaluation of this.  She also has a history of type 2 diabetes with recent improvement in hemoglobin A1c.  She was advised to be on Crestor 10 mg daily however she reported that she actually is not taking this medication.  Recent cholesterol testing showed total cholesterol 249, triglycerides 661, HDL 34.  LDL was not calculated due to high triglycerides.  In general she eats a traditional [place] diet.  She reports she is very physically active at her job.  Prior imaging including a CT scan of her abdomen before she underwent cholecystectomy did demonstrate aortoiliac atherosclerosis.  PMHx:  Past Medical History:  Diagnosis Date   Chronic pain of left lower extremity 07/14/2019   Diabetes mellitus without complication (HCC)    History of COVID-19 09/12/2022   Hyperlipidemia 11/20/2015   Hypertension associated with diabetes (HCC) 01/03/2022   Stage 3a chronic kidney disease (HCC) 06/04/2022    Past Surgical History:  Procedure Laterality Date   CHOLECYSTECTOMY N/A 09/14/2022   Procedure: LAPAROSCOPIC CHOLECYSTECTOMY WITH INTRAOPERATIVE CHOLANGIOGRAM;  Surgeon: Harriette Bouillon, MD;  Location: [facility];  Service: General;  Laterality: N/A;    FAMHx:  No family history on file.  SOCHx:   reports that she has never smoked. She has never used smokeless tobacco. She reports that she does not drink alcohol and does not use drugs.  ALLERGIES:  No Known Allergies  ROS: Pertinent items noted in HPI and  remainder of comprehensive ROS otherwise negative.  HOME MEDS: Current Outpatient Medications on File Prior to Visit  Medication Sig Dispense Refill   FARXIGA 10 MG TABS tablet TAKE 1 TABLET BY MOUTH DAILY BEFORE BREAKFAST. 90 tablet 3   glipiZIDE (GLUCOTROL) 5 MG tablet Take 1 tablet (5 mg total) by mouth 2 (two) times daily before a meal. 180 tablet 3   lisinopril (ZESTRIL) 20 MG tablet TAKE 1 TABLET BY MOUTH EVERY DAY 90 tablet 3   No current facility-administered medications on file prior to visit.    LABS/IMAGING: No results found for this or any previous visit (from the past 48 hour(s)). No results found.  LIPID PANEL:    Component Value Date/Time   CHOL 249 (H) 09/25/2022 1511   CHOL 182 07/12/2019 1549   TRIG (H) 09/25/2022 1511    661.0 Triglyceride is over 400; calculations on Lipids are invalid.   HDL 34.20 (L) 09/25/2022 1511   HDL 41 07/12/2019 1549   CHOLHDL 7 09/25/2022 1511   VLDL 47.8 (H) 01/03/2022 1627   LDLCALC 104 (H) 07/12/2019 1549   LDLDIRECT 74.0 09/25/2022 1511    WEIGHTS: Wt Readings from Last 3 Encounters:  05/06/23 107 lb (48.5 kg)  12/11/22 110 lb 4 oz (50 kg)  11/15/22 105 lb (47.6 kg)    VITALS: BP (!) 112/50 (BP Location: Left Arm, Patient Position: Sitting, Cuff Size: Normal)   Pulse 80   Ht 5\' 2"  (1.575 m)   Wt 107 lb (48.5 kg)   SpO2 97%   BMI 19.57  kg/m   EXAM: Deferred  EKG: Deferred  ASSESSMENT: High triglycerides Type 2 diabetes-A1c 7.8% Aortoiliac atherosclerosis Hypertension  PLAN: 1.   [name] has had high triglycerides although this may have partially been due to elevated blood sugar.  She was supposed to be on rosuvastatin 10 mg daily however has not been taking that medication.  It is advisable for her to be on high intensity statin and therefore I would start 20 mg rosuvastatin daily.  He should continue to watch diet only carbohydrates but also his saturated fats.  She was noted on imaging to have some  aortoiliac atherosclerosis.  She has hypertension although well-controlled.  Will plan repeat lipids in about 3 to 4 months including NMR and LP(a) and consider further adjustments in her medications as necessary.  Thanks as always for the kind referral.  Chrystie Nose, MD, [facility]  [facility]  Medical Director of the [facility] Diplomate of the American Board of Clinical Lipidology Attending Cardiologist  Direct Dial: [phone]  Fax: [phone]  Website:  [email or web address] 05/06/2023, 4:39 PM

## 2023-05-22 ENCOUNTER — Other Ambulatory Visit: Payer: Self-pay | Admitting: Emergency Medicine

## 2023-05-22 DIAGNOSIS — E1165 Type 2 diabetes mellitus with hyperglycemia: Secondary | ICD-10-CM

## 2023-06-11 ENCOUNTER — Ambulatory Visit: Payer: Medicare Other | Admitting: Emergency Medicine

## 2023-06-11 ENCOUNTER — Encounter: Payer: Self-pay | Admitting: Emergency Medicine

## 2023-06-11 VITALS — BP 160/80 | HR 74 | Temp 98.3°F | Ht 62.0 in | Wt 107.0 lb

## 2023-06-11 DIAGNOSIS — M79605 Pain in left leg: Secondary | ICD-10-CM | POA: Diagnosis not present

## 2023-06-11 DIAGNOSIS — Z23 Encounter for immunization: Secondary | ICD-10-CM | POA: Diagnosis not present

## 2023-06-11 DIAGNOSIS — N1831 Chronic kidney disease, stage 3a: Secondary | ICD-10-CM | POA: Diagnosis not present

## 2023-06-11 DIAGNOSIS — I152 Hypertension secondary to endocrine disorders: Secondary | ICD-10-CM

## 2023-06-11 DIAGNOSIS — E785 Hyperlipidemia, unspecified: Secondary | ICD-10-CM | POA: Diagnosis not present

## 2023-06-11 DIAGNOSIS — E1159 Type 2 diabetes mellitus with other circulatory complications: Secondary | ICD-10-CM

## 2023-06-11 DIAGNOSIS — G8929 Other chronic pain: Secondary | ICD-10-CM | POA: Diagnosis not present

## 2023-06-11 DIAGNOSIS — E1169 Type 2 diabetes mellitus with other specified complication: Secondary | ICD-10-CM | POA: Diagnosis not present

## 2023-06-11 LAB — POCT GLYCOSYLATED HEMOGLOBIN (HGB A1C): Hemoglobin A1C: 7.6 % — AB (ref 4.0–5.6)

## 2023-06-11 NOTE — Patient Instructions (Signed)
B?nh ti?u ???ng v dinh d??ng, ng??i l?n Diabetes Mellitus and Nutrition, Adult Khi qu v? b? ti?u ???ng hay b?nh ti?u ???ng, ?i?u r?t quan tr?ng l ph?i c thi quen ?n u?ng t?t cho s?c kh?e b?i v nh?ng g qu v? ?n v u?ng ?nh h??ng l?n ??n n?ng ?? ???ng huy?t (glucose) c?a qu v?. ?n th?c ph?m t?t cho s?c kh?e v?i s? l??ng v?a ph?i, vo cng cc th?i ?i?m m?i ngy, c th? gip qu v?: Qu?n l ???ng huy?t. Gi?m nguy c? b? b?nh tim. C?i thi?n huy?t p. ??t ???c ho?c duy tr m??c cn n?ng c l?i cho s?c kh?e. ?i?u g c th? ?nh h??ng ??n k? ho?ch ?n u?ng c?a ti? M?i ng??i b? ti?u ???ng khc nhau v m?i ng??i ??u c nhu c?u v? ch??ng trnh ?n u?ng khc nhau. Chuyn gia ch?m sc s?c kh?e c?a qu v? c th? khuyn qu v? trao ??i v?i chuyn gia dinh d??ng ?? xy d?ng ch??ng trnh ?n u?ng t?t nh?t cho qu v?. Ch??ng trnh ?n u?ng c?a qu v? c th? thay ??i ty thu?c vo cc y?u t? nh?: L??ng calo quy? vi? c?n. Cc lo?i thu?c m quy? vi? du?ng. Cn n??ng cu?a quy? vi?. N?ng ?? ???ng huy?t, huy?t p v cholesterol c?a qu v?. M?c ?? ho?t ??ng c?a qu v?. Cc tnh tr?ng s?c kh?e khc m qu v? c, ch?ng h?n nh? b?nh tim ho?c b?nh th?n. Carbohydrate ?nh h??ng nh? th? no ??n ti? Carbohydrate, hay cn g?i l carb, ?nh h??ng ??n l??ng ???ng huy?t c?a quy? vi? nhi?u h?n b?t k? lo?i th?c ph?m no khc. ?n carb lm t?ng l??ng glucose trong mu. Bi?t l??ng carb m qu v? c th? ?n m?t cch an ton trong m?i b?a ?n c vai tr quan tr?ng. L??ng carbohydrate ny khc nhau v?i m?i ng??i. Chuyn gia dinh d??ng c?a qu v? c th? gip tnh ton l??ng carb m qu v? c?n ph?i ?n vo m?i b?a ?n chnh v m?i b?a ?n nh?. R??u ?nh h??ng ??n ti nh? th? no? R??u c th? gy gi?m ???ng huy?t (h? ???ng huy?t), ??c bi?t l n?u qu v? s? d?ng insulin ho?c u?ng m?t s? lo?i thu?c nh?t ??nh ?i?u tr? ti?u ???ng. H? ???ng huy?t c th? l m?t tnh tr?ng ?e d?a tnh ma?ng. Cc tri?u ch?ng c?a h? ???ng huy?t, ch?ng h?n bu?n ng?, chng  m?t v l l?n, t??ng t? cc tri?u ch?ng c?a vi?c u?ng qu nhi?u r??u. Khng u?ng r??u n?u: Chuyn gia ch?m sc s?c kh?e khuyn qu v? khng u?ng r??u. Qu v? c thai, c th? c thai, ho?c c k? ho?ch c thai. N?u qu v? u?ng r??u: Gi?i h?n l??ng r??u qu v? u?ng ? m?c: 0-1 ly/ngy ??i v?i n? gi?i. 0-2 ly/ngy ??i v?i nam gi?i. Bi?t m?t ly c bao nhiu r??u. ? M?, m?t ly t??ng ???ng v?i m?t chai bia 12 ao-x? (355 mL), m?t ly r??u vang 5 ao-x? (148 mL), ho?c m?t ly r??u m?nh 1 ao-x? (44 mL). Lun b ?? n??c b?ng n??c, soda dnh cho ng??i ?n king, ho?c tr ? khng ???ng. Lun nh? r?ng soda thng th???ng, n??c tri cy v ca?c ?? u?ng h?n h??p khc c th? ch?a nhi?u ???ng v ph?i ???c tnh l carb. C nh?ng l?i khuyn no ?? tun th? k? ho?ch ny?  ??c nhn th?c ph?m B?t ??u b?ng vi?c ki?m tra kch c? kh?u ph?n trn nhn Thng tin dinh d??ng c?a th?c   ph?m v ?? u?ng ?ng gi. S? calo v l??ng carb, ch?t bo v cc ch?t dinh d??ng khc ghi trn nhn d?a trn m?t kh?u ph?n c?a mn ?. Nhi?u mn ch?a nhi?u h?n m?t kh?u ph?n trn m?i bao b. Ki?m tra t?ng s? gram (g) carb trong m?t kh?u ph?n. Ki?m tra s? gram ch?t bo bo ha v ch?t bo chuy?n ha trong m?t kh?u ph?n. Ch?n th?c ph?m c t ho?c khng c nh?ng ch?t bo ny. Ki?m tra s? miligam (mg) mu?i (natri) trong m?t kh?u ph?n. H?u h?t m?i ng??i ??u nn gi?i h?n t?ng natri tiu thu? ?? m??c d??i 2.300 mg m?i ngy. Lun ki?m tra thng tin dinh d??ng c?a th?c ph?m ghi trn nhn l "t bo" hay "khng bo". Nh?ng th?c ph?m ny c th? c b? sung ???ng ho?c carb tinh luy?n cao h?n v c?n ph?i trnh. Hy trao ??i v?i chuyn gia dinh d??ng c?a qu v? ?? xc ??nh m?c tiu hng ngy c?a qu v? ??i v?i cc ch?t dinh d??ng li?t k trn nhn. Mua s?m Trnh mua nh?ng th?c ph?m ?ng h?p, lm s?n ho?c ch? bi?n s?n. Nh?ng th?c ph?m ny c xu h??ng c nhi?u ch?t bo, natri, b? sung ???ng. Mua s?m quanh ra ngoi c?a c?a hng t?p ha. ?y l n?i qu v? s? d?  tm th?y tri cy v rau c? t??i, nhi?u lo?i h?t, th?t t??i v s?n ph?m s?a t??i. N?u n??ng S? d?ng ca?c ph??ng php n?u ?n nhi?t ?? th?p, ch?ng h?n nh? n??ng, thay v ph??ng php n?u nhi?t ?? cao nh? chin ng?p d?u. N?u ?n b?ng cch s? d?ng cc lo?i d?u t?t cho s?c kh?e, ch?ng h?n nh? d?u  liu, d?u h?t c?i d?u ho?c d?u h??ng d??ng. Trnh n?u v?i b?, kem, ho?c th?t nhi?u m?. Ln k? ho?ch cho b?a ?n ?n cc b?a ?n chnh v cc b?a ?n nh? ??u ??n, t?t nh?t l vo cng m?t th?i ?i?m m?i ngy. Trnh nhi?n ?n trong th?i gian di. ?n th?c ?n giu ch?t x? nh? cc lo?i tri cy t??i, rau c?, ??u v ng? c?c nguyn cm. ?n 4-6 ao-x? (oz) (112-168 g) protein n?c m?i ngy, ch?ng h?n nh? th?t n?c, th?t g, c, tr?ng, ho?c ??u ph?. M?t ao-x? (oz) (28 g) protein n?c b?ng: 1 ao-x? (oz) (28 g) th?t, th?t g ho?c c. 1 qu? tr?ng. 1?4 c?c (62 g) ??u ph?. ?n m?t s? th?c ph?m ch?a cc ch?t bo lnh m?nh m?i ngy, ch?ng h?n nh? qu? b?, qu? h?ch, cc lo?i h?t v c. Ti nn ?n nh?ng th?c ph?m no? Tri cy Qu? m?ng. To. Cam. Qu? ?o. Qu? m?. Qu? m?n. Nho. Xoi. Qu? ?u ??. Qu? l?u. Qu? kiwi. Qu? anh ?o. Rau c? Rau xanh, bao g?m c?i xo?n, rau bina, kale, c?i c?u v?ng, c?i r? c?i b? xanh v c?i b?p. C? c?i ???ng. Sp l?. Bng c?i xanh. C r?t. ??u xanh. C chua. ?t ng?t. Hnh ty. D?a chu?t. C?i bruxen. Ng? c?c Ng? c?c nguyn ca?m nh? bnh m nguyn ca?m ho??c nguyn h?t, bnh quy gin, bnh m ng, ng? c?c v m ?ng. B?t y?n m?ch khng ???ng. H?t dim m?ch (quinoa). G?o l?c ho?c g?o khng xt. Th?t v cc protein khc H?i s?n. Th?t gia c?m khng da. Mi?ng n?c th?t gia c?m v th?t b. ??u ph?. Qu? h?ch. Cc lo?i h?t. S?a Cc s?n ph?m s??a khng co? ho??c co? i?t ch?t be?o, ch?ng h?n nh? s?a, s??a   chua va? pho mt. Nh?ng th?c ph?m li?t k ? trn c th? khng ph?i l m?t danh m?c ??y ?? cc th?c ph?m v ?? u?ng m qu v? c th? ?n v u?ng. Lin h? v?i chuyn gia dinh d??ng ?? c thm thng tin. Ti nn trnh  nh?ng th?c ph?m no? Tri cy Tri cy ?ng h?p xi-r. Rau c? Rau c? ?ng h?p. Rau ?ng l?nh v?i b? ho?c n??c s?t kem. Ng? c?c Cc s?n ph?m lm t? b?t m tr?ng v b?t m ???c tinh ch? nh? bnh m, m ?ng, ?? ?n nh? v ng? c?c. Trnh t?t c? cc th?c ph?m ch? bi?n s?n. Th?t v cc protein khc Cc la?t thi?t m??. Th?t gia c?m c da. Th?t chin/rn ho?c t?m b?t rn. Th?t ch? bi?n s?n. Trnh cc ch?t bo bo ha. S?a S?a chua nguyn ch?t bo, pho mt ho?c s?a. ?? u?ng ?? u?ng c ???ng, ch?ng h?n nh? soda ho?c tr ?. Nh?ng th?c ph?m li?t k ? trn c th? khng ph?i l m?t danh m?c ??y ?? cc lo?i th?c ph?m v ?? u?ng m qu v? nn trnh. Lin h? v?i chuyn gia dinh d??ng ?? c thm thng tin. Ca?c cu h?i c?n ??a ra v?i chuyn gia ch?m sc s?c kh?e Ti c c?n g?p chuyn gia gio d?c v ch?m sc b?nh ti?u ???ng c ch?ng nh?n khng? Ti c c?n g?p chuyn gia dinh d??ng khng? Ti co? th? go?i cho s? na?o n?u ti co? th?c m?c? Khi no l th?i ?i?m ki?m tra ???ng huy?t t?t nh?t? N?i ?? tm thm thng tin: American Diabetes Association (Hi?p h?i Ti?u ????ng Hoa K?): [email or web address] Academy of Nutrition and Dietetics (H?c vi?n Dinh d??ng v ?n u?ng): [email or web address] National Institute of Diabetes and Digestive and Kidney Diseases (Vi?n Ti?u ???ng v cc B?nh Tiu ha v B?nh Th?n Qu?c gia): [email or web address] Association of Diabetes [employer] Education [employer]ists (Hi?p h?i Chuyn gia Gio d?c v Ch?m sc B?nh ti?u ???ng): [email or web address] Tm t?t ?i?u r?t quan tr?ng l ph?i c thi quen ?n u?ng t?t cho s?c kh?e b?i v nh?ng g qu v? ?n v u?ng ?nh h??ng l?n ??n n?ng ?? ???ng huy?t (glucose) c?a qu v?. ?i?u quan tr?ng l s? d?ng m?t r??u cch th?n tr?ng. M?t k? ho?ch ?n u?ng t?t cho s?c kh?e s? gip qu v? ki?m sot ???ng huy?t v gi?m nguy c? m?c b?nh tim. Chuyn gia ch?m sc s?c kh?e c?a qu v? c th? khuyn qu v? trao ??i v?i chuyn gia dinh d??ng ?? xy d?ng ch??ng trnh ?n u?ng t?t nh?t cho qu  v?. Thng tin ny khng nh?m m?c ?ch thay th? cho l?i khuyn m chuyn gia ch?m sc s?c kh?e ni v?i qu v?. Hy b?o ??m qu v? ph?i th?o lu?n b?t k? v?n ?? g m qu v? c v?i chuyn gia ch?m sc s?c kh?e c?a qu v?. Document Revised: 11/16/2020 Document Reviewed: 11/16/2020 [employer] Patient Education  2024 [employer]  

## 2023-06-11 NOTE — Assessment & Plan Note (Signed)
Advised to stay well-hydrated and avoid NSAIDs. Continue Farxiga 10 mg daily 

## 2023-06-11 NOTE — Assessment & Plan Note (Signed)
Chronic stable condition Recently evaluated at lipid clinic Continue rosuvastatin 20 mg daily Diet and nutrition discussed

## 2023-06-11 NOTE — Progress Notes (Signed)
Cathy Robbins 67 y.o.   Chief Complaint  Patient presents with   Follow-up    6 month f/u for DM. Patient of c/o left leg pain she has been using a cane to get around.     HISTORY OF PRESENT ILLNESS: This is a 67 y.o. female here for 13-month follow-up of diabetes and dyslipidemia [place] interpreter used during this interview Has chronic left calf pain for the past several years No other complaints or medical concerns today. Most recent cardiologist office visit assessment and plan as follows: ASSESSMENT: High triglycerides Type 2 diabetes-A1c 7.8% Aortoiliac atherosclerosis Hypertension   PLAN: 1.   [name] has had high triglycerides although this may have partially been due to elevated blood sugar.  She was supposed to be on rosuvastatin 10 mg daily however has not been taking that medication.  It is advisable for her to be on high intensity statin and therefore I would start 20 mg rosuvastatin daily.  He should continue to watch diet only carbohydrates but also his saturated fats.  She was noted on imaging to have some aortoiliac atherosclerosis.  She has hypertension although well-controlled.  Will plan repeat lipids in about 3 to 4 months including NMR and LP(a) and consider further adjustments in her medications as necessary.   Thanks as always for the kind referral.   Chrystie Nose, MD, [facility], FACP  [place]  [facility]   HPI   Prior to Admission medications   Medication Sig Start Date End Date Taking? Authorizing Provider  FARXIGA 10 MG TABS tablet TAKE 1 TABLET BY MOUTH DAILY BEFORE BREAKFAST. 04/02/23  Yes Eldredge Veldhuizen, Eilleen Kempf, MD  glipiZIDE (GLUCOTROL) 5 MG tablet TAKE 1 TABLET (5 MG TOTAL) BY MOUTH TWICE A DAY BEFORE MEALS 05/22/23  Yes Tahliyah Anagnos, Eilleen Kempf, MD  lisinopril (ZESTRIL) 20 MG tablet TAKE 1 TABLET BY MOUTH EVERY DAY 02/22/23  Yes Reigna Ruperto, Eilleen Kempf, MD  rosuvastatin (CRESTOR) 20 MG tablet Take 1 tablet (20 mg total) by mouth daily. 05/06/23   Yes Hilty, Lisette Abu, MD    No Known Allergies  Patient Active Problem List   Diagnosis Date Noted   Non-English speaking patient (Vietnamese) 09/12/2022   Anemia 09/12/2022   Stage 3a chronic kidney disease (HCC) 06/04/2022   Uncontrolled type 2 diabetes mellitus with hyperglycemia (HCC) 01/03/2022   Hypertension associated with diabetes (HCC) 01/03/2022   Chronic pain of left lower extremity 07/14/2019   Positive ANA (antinuclear antibody) 07/14/2019   Dyslipidemia associated with type 2 diabetes mellitus (HCC) 11/20/2015   Hyperlipidemia 11/20/2015    Past Medical History:  Diagnosis Date   Chronic pain of left lower extremity 07/14/2019   Diabetes mellitus without complication (HCC)    History of COVID-19 09/12/2022   Hyperlipidemia 11/20/2015   Hypertension associated with diabetes (HCC) 01/03/2022   Stage 3a chronic kidney disease (HCC) 06/04/2022    Past Surgical History:  Procedure Laterality Date   CHOLECYSTECTOMY N/A 09/14/2022   Procedure: LAPAROSCOPIC CHOLECYSTECTOMY WITH INTRAOPERATIVE CHOLANGIOGRAM;  Surgeon: Harriette Bouillon, MD;  Location: [facility];  Service: General;  Laterality: N/A;    Social History   Socioeconomic History   Marital status: Married    Spouse name: Not on file   Number of children: 4   Years of education: Not on file   Highest education level: Not on file  Occupational History   Not on file  Tobacco Use   Smoking status: Never   Smokeless tobacco: Never  Vaping Use  Vaping status: Never Used  Substance and Sexual Activity   Alcohol use: No   Drug use: No   Sexual activity: Not Currently  Other Topics Concern   Not on file  Social History Narrative   Not on file   Social Determinants of Health   Financial Resource Strain: Low Risk  (01/12/2019)   Overall Financial Resource Strain (CARDIA)    Difficulty of Paying Living Expenses: Not hard at all  Food Insecurity: No Food Insecurity (09/13/2022)   Hunger Vital Sign     Worried About Running Out of Food in the Last Year: Never true    Ran Out of Food in the Last Year: Never true  Transportation Needs: No Transportation Needs (09/13/2022)   PRAPARE - Administrator, Civil Service (Medical): No    Lack of Transportation (Non-Medical): No  Physical Activity: Sufficiently Active (01/12/2019)   Exercise Vital Sign    Days of Exercise per Week: 5 days    Minutes of Exercise per Session: 30 min  Stress: No Stress Concern Present (01/12/2019)   [employer] of Occupational Health - Occupational Stress Questionnaire    Feeling of Stress : Not at all  Social Connections: Moderately Isolated (01/12/2019)   Social Connection and Isolation Panel [NHANES]    Frequency of Communication with Friends and Family: Twice a week    Frequency of Social Gatherings with Friends and Family: Once a week    Attends Religious Services: Never    Database administrator or Organizations: No    Attends Banker Meetings: Never    Marital Status: Married  Catering manager Violence: Not At Risk (09/13/2022)   Humiliation, Afraid, Rape, and Kick questionnaire    Fear of Current or Ex-Partner: No    Emotionally Abused: No    Physically Abused: No    Sexually Abused: No    No family history on file.   Review of Systems  Constitutional: Negative.  Negative for chills and fever.  HENT: Negative.  Negative for congestion and sore throat.   Respiratory: Negative.  Negative for cough and shortness of breath.   Cardiovascular: Negative.  Negative for chest pain and palpitations.  Gastrointestinal:  Negative for abdominal pain, diarrhea, nausea and vomiting.  Genitourinary: Negative.  Negative for dysuria and hematuria.  Skin: Negative.  Negative for rash.  Neurological: Negative.  Negative for dizziness and headaches.  All other systems reviewed and are negative.   Vitals:   06/11/23 1007  BP: (!) 160/80  Pulse: 74  Temp: 98.3 F (36.8 C)  SpO2: 96%     Physical Exam Vitals reviewed.  Constitutional:      Appearance: Normal appearance.  HENT:     Head: Normocephalic.     Mouth/Throat:     Mouth: Mucous membranes are moist.     Pharynx: Oropharynx is clear.  Eyes:     Extraocular Movements: Extraocular movements intact.     Pupils: Pupils are equal, round, and reactive to light.  Cardiovascular:     Rate and Rhythm: Normal rate and regular rhythm.     Pulses: Normal pulses.     Heart sounds: Normal heart sounds.  Pulmonary:     Effort: Pulmonary effort is normal.     Breath sounds: Normal breath sounds.  Abdominal:     Palpations: Abdomen is soft.     Tenderness: There is no abdominal tenderness.  Musculoskeletal:     Cervical back: No tenderness.     Comments:  Left calf: No erythema or swelling.  No tenderness.  No abnormal findings.  Lymphadenopathy:     Cervical: No cervical adenopathy.  Skin:    General: Skin is warm and dry.     Capillary Refill: Capillary refill takes less than 2 seconds.  Neurological:     General: No focal deficit present.     Mental Status: She is alert and oriented to person, place, and time.  Psychiatric:        Mood and Affect: Mood normal.        Behavior: Behavior normal.    Results for orders placed or performed in visit on 06/11/23 (from the past 24 hour(s))  POCT HgB A1C     Status: Abnormal   Collection Time: 06/11/23 10:25 AM  Result Value Ref Range   Hemoglobin A1C 7.6 (A) 4.0 - 5.6 %   HbA1c POC (<> result, manual entry)     HbA1c, POC (prediabetic range)     HbA1c, POC (controlled diabetic range)       ASSESSMENT & PLAN: A total of 44 minutes was spent with the patient and counseling/coordination of care regarding preparing for this visit, review of most recent office visit notes, review of multiple chronic medical conditions and their management, cardiovascular risks associated with hypertension and diabetes, review of all medications, review of most recent bloodwork  results including interpretation of today's hemoglobin A1c, review of health maintenance items, education on nutrition, prognosis, documentation, and need for follow up.   Problem List Items Addressed This Visit       Cardiovascular and Mediastinum   Hypertension associated with diabetes (HCC)    BP Readings from Last 3 Encounters:  06/11/23 (!) 160/80  05/06/23 (!) 112/50  12/11/22 130/74  L better blood pressure reading in the office but normal at home Continue lisinopril 20 mg daily Hemoglobin A1c better than before 7.6 Continue glipizide 5 mg twice a day Taking Farxiga 10 mg daily but states it is too expensive Cardiovascular risks associated with hypertension and diabetes discussed Diet and nutrition discussed Follow-up in 6 months         Endocrine   Dyslipidemia associated with type 2 diabetes mellitus (HCC) - Primary    Chronic stable condition Recently evaluated at lipid clinic Continue rosuvastatin 20 mg daily Diet and nutrition discussed      Relevant Orders   POCT HgB A1C (Completed)     Genitourinary   Stage 3a chronic kidney disease (HCC)    Advised to stay well-hydrated and avoid NSAIDs Continue Farxiga 10 mg daily        Other   Chronic pain of left lower extremity    No significant physical findings.  Chronic calf pain since 2017 Pain management discussed.  No concerns.      Other Visit Diagnoses     Need for influenza vaccination       Relevant Orders   Flu Vaccine Trivalent High Dose (Fluad) (Completed)      Patient Instructions  B?nh ti?u ???ng v dinh d??ng, ng??i l?n Diabetes Mellitus and Nutrition, Adult Khi qu v? b? ti?u ???ng hay b?nh ti?u ???ng, ?i?u r?t quan tr?ng l ph?i c thi quen ?n u?ng t?t cho s?c kh?e b?i v nh?ng g qu v? ?n v u?ng ?nh h??ng l?n ??n n?ng ?? ???ng huy?t (glucose) c?a qu v?. ?n th?c ph?m t?t cho s?c kh?e v?i s? l??ng v?a ph?i, vo cng cc th?i ?i?m m?i ngy, c th? gip qu v?: Qu?n  l ???ng  huy?t. Gi?m nguy c? b? b?nh tim. C?i thi?n huy?t p. ??t ???c ho?c duy tr m??c cn n?ng c l?i cho s?c kh?e. ?i?u g c th? ?nh h??ng ??n k? ho?ch ?n u?ng c?a ti? M?i ng??i b? ti?u ???ng khc nhau v m?i ng??i ??u c nhu c?u v? ch??ng trnh ?n u?ng khc nhau. Chuyn gia ch?m Central s?c kh?e c?a qu v? c th? [place] qu v? trao ??i v?i chuyn gia dinh d??ng ?? xy d?ng ch??ng trnh ?n u?ng t?t nh?t cho qu v?. Ch??ng trnh ?n u?ng c?a qu v? c th? thay ??i ty thu?c vo cc y?u t? nh?: L??ng calo quy? vi? c?n. Cc lo?i thu?c m quy? vi? du?ng. Cn n??ng cu?a quy? vi?. N?ng ?? ???ng huy?t, huy?t p v cholesterol c?a qu v?. M?c ?? ho?t ??ng c?a qu v?. Cc tnh tr?ng s?c kh?e khc m qu v? c, ch?ng h?n nh? b?nh tim ho?c b?nh th?n. Carbohydrate ?nh h??ng nh? th? no ??n ti? Carbohydrate, hay cn g?i l carb, ?nh h??ng ??n l??ng ???ng huy?t c?a quy? vi? nhi?u h?n b?t k? lo?i th?c ph?m no khc. ?n carb lm t?ng l??ng glucose trong mu. Bi?t l??ng carb m qu v? c th? ?n m?t cch an ton trong m?i b?a ?n c vai tr quan tr?ng. L??ng carbohydrate ny khc nhau v?i m?i ng??i. Chuyn gia dinh d??ng c?a qu v? c th? gip tnh ton l??ng carb m qu v? c?n ph?i ?n vo m?i b?a ?n chnh v m?i b?a ?n nh?. R??u ?nh h??ng ??n ti nh? th? no? R??u c th? gy gi?m ???ng huy?t (h? ???ng huy?t), ??c bi?t l n?u qu v? s? d?ng insulin ho?c u?ng m?t s? lo?i thu?c nh?t ??nh ?i?u tr? ti?u ???ng. H? ???ng huy?t c th? l m?t tnh tr?ng ?e d?a tnh [place]?ng. Cc tri?u ch?ng c?a h? ???ng huy?t, ch?ng h?n bu?n ng?, chng m?t v l l?n, t??ng t? cc tri?u ch?ng c?a vi?c u?ng qu nhi?u r??u. Khng u?ng r??u n?u: Chuyn gia ch?m [place] s?c kh?e khuyn qu v? khng u?ng r??u. Qu v? c [place], c th? c [place], ho?c c k? ho?ch c [place]. N?u qu v? u?ng r??u: Gi?i h?n l??ng r??u qu v? u?ng ? m?c: 0-1 ly/ngy ??i v?i n? gi?i. 0-2 ly/ngy ??i v?i nam gi?i. Bi?t m?t ly c bao nhiu r??u. ? M?, m?t ly t??ng ???ng v?i m?t chai  bia 12 ao-x? (355 mL), m?t ly r??u vang 5 ao-x? (148 mL), ho?c m?t ly r??u m?nh 1 ao-x? (44 mL). Lun b ?? n??c b?ng n??c, soda dnh cho ng??i ?n king, ho?c tr ? khng ???ng. Tamera Punt nh? r?ng soda thng th???ng, n??c tri cy v ca?c ?? u?ng h?n h??p khc c th? ch?a nhi?u ???ng v ph?i ???c tnh l carb. C nh?ng l?i khuyn no ?? tun th? k? ho?ch ny?  ??c nhn th?c ph?m B?t ??u b?ng vi?c ki?m tra kch c? kh?u ph?n trn nhn Thng tin dinh d??ng c?a th?c ph?m v ?? u?ng ?ng gi. S? calo v l??ng carb, ch?t bo v cc ch?t dinh d??ng khc ghi trn nhn d?a trn m?t kh?u ph?n c?a mn ?. Nhi?u mn ch?a nhi?u h?n m?t kh?u ph?n trn m?i bao b. Ki?m tra t?ng s? gram (g) carb trong m?t kh?u ph?n. Ki?m tra s? gram ch?t bo bo ha v ch?t bo chuy?n ha trong m?t kh?u ph?n. Ch?n th?c ph?m c t ho?c khng c nh?ng ch?t bo ny. Ki?m  tra s? miligam (mg) mu?i (natri) trong m?t kh?u ph?n. H?u h?t m?i ng??i ??u nn gi?i h?n t?ng natri tiu thu? ?? m??c d??i 2.300 mg m?i ngy. Lun ki?m tra thng tin dinh d??ng c?a th?c ph?m ghi trn nhn l "t bo" hay "khng bo". Nh?ng th?c ph?m ny c th? c b? sung ???ng ho?c carb tinh luy?n cao h?n v c?n ph?i trnh. Hy trao ??i v?i chuyn gia dinh d??ng c?a qu v? ?? xc ??nh m?c tiu hng ngy c?a qu v? ??i v?i cc ch?t dinh d??ng li?t k trn nhn. Mua s?m Trnh mua nh?ng th?c ph?m ?ng h?p, lm s?n ho?c ch? bi?n s?n. Nh?ng th?c ph?m ny c xu h??ng c nhi?u ch?t bo, natri, b? sung ???ng. Mua s?m quanh ra ngoi c?a c?a hng t?p ha. ?y l n?i qu v? s? d? tm th?y tri cy v rau c? t??i, nhi?u lo?i h?t, th?t t??i v s?n ph?m s?a t??i. N?u n??ng S? d?ng ca?c ph??ng php n?u ?n nhi?t ?? th?p, ch?ng h?n nh? n??ng, thay v ph??ng php n?u nhi?t ?? cao nh? chin ng?p d?u. N?u ?n b?ng cch s? d?ng cc lo?i d?u t?t cho s?c kh?e, ch?ng h?n nh? d?u  liu, d?u h?t c?i d?u ho?c d?u h??ng d??ng. [place] n?u v?i b?, kem, ho?c th?t nhi?u m?. Ln k? ho?ch cho b?a  ?n ?n cc b?a ?n chnh v cc b?a ?n nh? ??u ??n, t?t nh?t l vo cng m?t th?i ?i?m m?i ngy. Trnh nhi?n ?n trong th?i [employer]. ?n th?c ?n giu ch?t x? nh? cc lo?i tri cy t??i, rau c?, ??u v ng? c?c nguyn cm. ?n 4-6 ao-x? (oz) (112-168 g) protein n?c m?i ngy, ch?ng h?n nh? th?t n?c, th?t g, c, tr?ng, ho?c ??u ph?. M?t ao-x? (oz) (28 g) protein n?c b?ng: 1 ao-x? (oz) (28 g) th?t, th?t g ho?c c. 1 qu? tr?ng. 1?4 c?c (62 g) ??u ph?. ?n m?t s? th?c ph?m ch?a cc ch?t bo lnh m?nh m?i ngy, ch?ng h?n nh? qu? b?, qu? h?ch, cc lo?i h?t v c. Ti nn ?n nh?ng th?c ph?m no? Tri cy Qu? m?ng. To. [place]? ?o. Qu? m?. Qu? m?n. Nho. Xoi. Qu? ?u ??[place]? l?u. Qu? kiwi. Qu? anh ?o. Derrek Gu c? Ferne Coe, bao g?m c?i xo?n, rau bina, kale, c?i c?u v?ng, c?i r? c?i b? xanh v c?i b?p. C? c?i ???ng. Sp l?. Bng c?i xanh. C r?t. ??u xanh. C chua. ?t ng?t. Hnh ty. D?a chu?t. C?i bruxen. Ng? c?c Ng? c?c nguyn ca?m nh? bnh m nguyn ca?m ho??c nguyn h?t, bnh quy gin, bnh m ng, ng? c?c v m ?ng. B?t y?n m?ch khng ???ng. H?t dim m?ch (quinoa). G?o l?c ho?c g?o khng xt. Th?t v cc protein khc H?i s?n. Th?t gia c?m khng da. Mi?ng n?c th?t gia c?m v th?t b. ??u ph?. Qu? h?ch. Cc lo?i h?t. S?a Cc s?n ph?m s??a khng co? ho??c co? i?t ch?t be?o, ch?ng h?n nh? s?a, s??a chua va? pho mt. Nh?ng th?c ph?m li?t k ? trn c th? khng ph?i l m?t danh m?c ??y ?? cc th?c ph?m v ?? u?ng m qu v? c th? ?n v u?ng. Lin h? v?i chuyn gia dinh d??ng ?? c thm thng tin. Ti nn trnh nh?ng th?c ph?m no? Tri cy Tri cy ?ng h?p xi-r. Derrek Gu c? Derrek Gu c? ?ng h?p. Rau ?ng l?nh v?i b? ho?c n??c s?t kem. Ng? c?c Cc s?n ph?m lm t?  b?t m tr?ng v b?t m ???c tinh ch? nh? bnh m, m ?ng, ?? ?n nh? v ng? c?c. Trnh t?t c? cc th?c ph?m ch? bi?n s?n. Th?t v cc protein khc Cc la?t thi?t m??. Th?t gia c?m c da. Th?t chin/rn ho?c t?m b?t rn. Th?t ch? bi?n s?n. Trnh cc ch?t bo  bo ha. S?a S?a chua nguyn ch?t bo, pho mt ho?c s?a. ?? u?ng ?? u?ng c ???ng, ch?ng h?n nh? soda ho?c tr ?Marland Kitchen Nh?ng th?c ph?m li?t k ? trn c th? khng ph?i l m?t danh m?c ??y ?? cc lo?i th?c ph?m v ?? u?ng m qu v? nn trnh. Lin h? v?i chuyn gia dinh d??ng ?? c thm thng tin. Ca?c cu h?i c?n ??a ra v?i chuyn gia ch?m [place] s?c kh?e Ti c c?n g?p chuyn gia gio d?c v ch?m [place] b?nh ti?u ???ng c ch?ng nh?n khng? Ti c c?n g?p chuyn gia dinh d??ng khng? Ti co? th? go?i cho s? na?o n?u ti co? th?c m?c? Khi no l th?i ?i?m ki?m tra ???ng huy?t t?t nh?t? N?i ?? tm thm thng tin: American Diabetes Association (Hi?p h?i Ti?u ????ng Hoa K?): diabetes.org Academy of Nutrition and Dietetics (H?c vi?n Dinh d??ng v ?n u?ng): eatright.[employer] of Diabetes and Digestive and Kidney Diseases (Vi?n Ti?u ???ng v cc B?nh Tiu ha v B?nh Th?n Qu?c gia): StageSync.si Association of Diabetes Care & Education Specialists (Hi?p h?i Chuyn gia Gio d?c v Ch?m Dickinson B?nh ti?u ???ng): [email or web address].org Tm t?t ?i?u r?t quan tr?ng l ph?i c thi quen ?n u?ng t?t cho s?c kh?e b?i v nh?ng g qu v? ?n v u?ng ?nh h??ng l?n ??n n?ng ?? ???ng huy?t (glucose) c?a qu v?. ?i?u quan tr?ng l s? d?ng m?t r??u cch th?n tr?ng. M?t k? ho?ch ?n u?ng t?t cho s?c kh?e s? gip qu v? ki?m sot ???ng huy?t v gi?m nguy c? m?c b?nh tim. Chuyn gia ch?m [place] s?c kh?e c?a qu v? c th? [place] qu v? trao ??i v?i chuyn gia dinh d??ng ?? xy d?ng ch??ng trnh ?n u?ng t?t nh?t cho qu v?. Thng tin ny khng nh?m m?c ?ch thay th? cho l?i khuyn m chuyn gia ch?m [place] s?c kh?e ni v?i qu v?. Hy b?o ??m qu v? ph?i th?o lu?n b?t k? v?n ?? g m qu v? c v?i chuyn gia ch?m [place] s?c kh?e c?a qu v?. Document Revised: 11/16/2020 Document Reviewed: 11/16/2020 Elsevier Patient Education  2024 Elsevier Inc.      Edwina Barth, MD [facility]

## 2023-06-11 NOTE — Assessment & Plan Note (Signed)
BP Readings from Last 3 Encounters:  06/11/23 (!) 160/80  05/06/23 (!) 112/50  12/11/22 130/74  L better blood pressure reading in the office but normal at home Continue lisinopril 20 mg daily Hemoglobin A1c better than before 7.6 Continue glipizide 5 mg twice a day Taking Farxiga 10 mg daily but states it is too expensive Cardiovascular risks associated with hypertension and diabetes discussed Diet and nutrition discussed Follow-up in 6 months

## 2023-06-11 NOTE — Assessment & Plan Note (Signed)
No significant physical findings.  Chronic calf pain since 2017 Pain management discussed.  No concerns.

## 2023-09-19 ENCOUNTER — Other Ambulatory Visit

## 2023-09-19 DIAGNOSIS — E781 Pure hyperglyceridemia: Secondary | ICD-10-CM | POA: Diagnosis not present

## 2023-09-19 NOTE — Addendum Note (Signed)
 Addended by: Chrystie Nose on: 09/19/2023 10:08 AM   Modules accepted: Orders

## 2023-09-21 LAB — LIPID PANEL
Chol/HDL Ratio: 6.9 ratio — ABNORMAL HIGH (ref 0.0–4.4)
Cholesterol, Total: 263 mg/dL — ABNORMAL HIGH (ref 100–199)
HDL: 38 mg/dL — ABNORMAL LOW (ref >39)
LDL Chol Calc (NIH): 105 mg/dL — ABNORMAL HIGH (ref 0–99)
Triglycerides: 689 mg/dL (ref 0–149)
VLDL Cholesterol Cal: 120 mg/dL — ABNORMAL HIGH (ref 5–40)

## 2023-09-21 LAB — LDL CHOLESTEROL, DIRECT: LDL Direct: 76 mg/dL (ref 0–99)

## 2023-09-21 LAB — LIPOPROTEIN A (LPA): Lipoprotein (a): 142.8 nmol/L — ABNORMAL HIGH (ref <75.0)

## 2023-09-23 ENCOUNTER — Other Ambulatory Visit (HOSPITAL_COMMUNITY): Payer: Self-pay

## 2023-09-23 ENCOUNTER — Telehealth: Payer: Self-pay | Admitting: Pharmacy Technician

## 2023-09-23 ENCOUNTER — Telehealth (HOSPITAL_BASED_OUTPATIENT_CLINIC_OR_DEPARTMENT_OTHER): Payer: Self-pay

## 2023-09-23 ENCOUNTER — Ambulatory Visit (HOSPITAL_BASED_OUTPATIENT_CLINIC_OR_DEPARTMENT_OTHER): Payer: Medicare Other | Admitting: Internal Medicine

## 2023-09-23 VITALS — BP 106/70 | HR 70 | Ht 59.0 in | Wt 107.0 lb

## 2023-09-23 DIAGNOSIS — I700 Atherosclerosis of aorta: Secondary | ICD-10-CM

## 2023-09-23 DIAGNOSIS — E785 Hyperlipidemia, unspecified: Secondary | ICD-10-CM | POA: Diagnosis not present

## 2023-09-23 DIAGNOSIS — E7841 Elevated Lipoprotein(a): Secondary | ICD-10-CM

## 2023-09-23 DIAGNOSIS — I708 Atherosclerosis of other arteries: Secondary | ICD-10-CM

## 2023-09-23 DIAGNOSIS — E781 Pure hyperglyceridemia: Secondary | ICD-10-CM | POA: Diagnosis not present

## 2023-09-23 DIAGNOSIS — I10 Essential (primary) hypertension: Secondary | ICD-10-CM

## 2023-09-23 DIAGNOSIS — E1169 Type 2 diabetes mellitus with other specified complication: Secondary | ICD-10-CM | POA: Diagnosis not present

## 2023-09-23 MED ORDER — REPATHA SURECLICK 140 MG/ML ~~LOC~~ SOAJ: 140.0000 mg | SUBCUTANEOUS | 3 refills | Status: DC

## 2023-09-23 NOTE — Telephone Encounter (Signed)
 [place] approved. Pharmacy and patient aware.

## 2023-09-23 NOTE — Telephone Encounter (Signed)
 Please prior auth Repatha for pt.

## 2023-09-23 NOTE — Patient Instructions (Addendum)
 Medication Instructions:   START Repatha every 14 days inject into skin subcutaneously.    *If you need a refill on your cardiac medications before your next appointment, please call your pharmacy*   Lab Work: Your physician recommends that you return for a FASTING NMR/LPA after midnight. One week prior to your appointment. Paperwork given to patient today.   If you have labs (blood work) drawn today and your tests are completely normal, you will receive your results only by: MyChart Message (if you have MyChart) OR A paper copy in the mail If you have any lab test that is abnormal or we need to change your treatment, we will call you to review the results.   Testing/Procedures:  None ordered.    Follow-Up: At [facility], you and your health needs are our priority.  As part of our continuing mission to provide you with exceptional heart care, we have created designated Provider Care Teams.  These Care Teams include your primary Cardiologist (physician) and Advanced Practice Providers (APPs -  Physician Assistants and Nurse Practitioners) who all work together to provide you with the care you need, when you need it.  We recommend signing up for the patient portal called "MyChart".  Sign up information is provided on this After Visit Summary.  MyChart is used to connect with patients for Virtual Visits (Telemedicine).  Patients are able to view lab/test results, encounter notes, upcoming appointments, etc.  Non-urgent messages can be sent to your provider as well.   To learn more about what you can do with MyChart, go to [email or web address].    Your next appointment:   4 month(s)  Provider:   Eligha Bridegroom, NP    Other Instructions

## 2023-09-23 NOTE — Telephone Encounter (Signed)
 Pharmacy Patient Advocate Encounter  Received notification from [facility] that Prior Authorization for Repatha has been APPROVED from 09/23/23 to 03/25/24. Ran test claim, Copay is $396.00- 3 months (DEDCTIBLE). This test claim was processed through [facility]- copay amounts may vary at other pharmacies due to pharmacy/plan contracts, or as the patient moves through the different stages of their insurance plan.   PA #/Case ID/Reference #: [id number]

## 2023-09-23 NOTE — Telephone Encounter (Signed)
 Pharmacy Patient Advocate Encounter   Received notification from Pt Calls Messages that prior authorization for Repatha is required/requested.   Insurance verification completed.   The patient is insured through [employer] .   Per test claim: PA required; PA submitted to above mentioned insurance via CoverMyMeds Key/confirmation #/EOC [id number] Status is pending

## 2023-09-23 NOTE — Telephone Encounter (Signed)
 Here ya go!

## 2023-09-23 NOTE — Progress Notes (Signed)
 LIPID CLINIC CONSULT NOTE  Chief Complaint:  Follow-up dyslipidemia  Primary Care Physician: Georgina Quint, MD  Primary Cardiologist:  None  HPI:  Cathy Robbins is a 68 y.o. female who is being seen today for the evaluation of dyslipidemia at the request of Georgina Quint, *.  This is a pleasant 68 year old [place] female who is seen today with a Adult nurse and her daughter who is [place] speaking.  She has a history of high triglycerides and was referred for evaluation of this.  She also has a history of type 2 diabetes with recent improvement in hemoglobin A1c.  She was advised to be on Crestor 10 mg daily however she reported that she actually is not taking this medication.  Recent cholesterol testing showed total cholesterol 249, triglycerides 661, HDL 34.  LDL was not calculated due to high triglycerides.  In general she eats a traditional [place] diet.  She reports she is very physically active at her job.  Prior imaging including a CT scan of her abdomen before she underwent cholecystectomy did demonstrate aortoiliac atherosclerosis.  09/23/2023  Cathy Robbins is seen today in follow-up.  This was performed with a video translation service.  Through the translator's help we determined that she is compliant with her rosuvastatin although her cholesterol still remains high.  Total is now at 263 with triglycerides 689 and LDL 76.  She also had LP(a) which was tested and elevated 142.8 nmol/L.  I do not see any significant improvement with increasing her rosuvastatin.  PMHx:  Past Medical History:  Diagnosis Date   Chronic pain of left lower extremity 07/14/2019   Diabetes mellitus without complication (HCC)    History of COVID-19 09/12/2022   Hyperlipidemia 11/20/2015   Hypertension associated with diabetes (HCC) 01/03/2022   Stage 3a chronic kidney disease (HCC) 06/04/2022    Past Surgical History:  Procedure Laterality Date   CHOLECYSTECTOMY N/A 09/14/2022    Procedure: LAPAROSCOPIC CHOLECYSTECTOMY WITH INTRAOPERATIVE CHOLANGIOGRAM;  Surgeon: Harriette Bouillon, MD;  Location: [facility];  Service: General;  Laterality: N/A;    FAMHx:  No family history on file.  SOCHx:   reports that she has never smoked. She has never used smokeless tobacco. She reports that she does not drink alcohol and does not use drugs.  ALLERGIES:  No Known Allergies  ROS: Pertinent items noted in HPI and remainder of comprehensive ROS otherwise negative.  HOME MEDS: Current Outpatient Medications on File Prior to Visit  Medication Sig Dispense Refill   FARXIGA 10 MG TABS tablet TAKE 1 TABLET BY MOUTH DAILY BEFORE BREAKFAST. 90 tablet 3   glipiZIDE (GLUCOTROL) 5 MG tablet TAKE 1 TABLET (5 MG TOTAL) BY MOUTH TWICE A DAY BEFORE MEALS 180 tablet 3   lisinopril (ZESTRIL) 20 MG tablet TAKE 1 TABLET BY MOUTH EVERY DAY 90 tablet 3   rosuvastatin (CRESTOR) 20 MG tablet Take 1 tablet (20 mg total) by mouth daily. 90 tablet 3   No current facility-administered medications on file prior to visit.    LABS/IMAGING: No results found for this or any previous visit (from the past 48 hours). No results found.  LIPID PANEL:    Component Value Date/Time   CHOL 263 (H) 09/19/2023 1021   TRIG 689 (HH) 09/19/2023 1021   HDL 38 (L) 09/19/2023 1021   CHOLHDL 6.9 (H) 09/19/2023 1021   CHOLHDL 7 09/25/2022 1511   VLDL 47.8 (H) 01/03/2022 1627   LDLCALC 105 (H) 09/19/2023 1021   LDLDIRECT 76  09/19/2023 1021   LDLDIRECT 74.0 09/25/2022 1511    WEIGHTS: Wt Readings from Last 3 Encounters:  09/23/23 107 lb (48.5 kg)  06/11/23 107 lb (48.5 kg)  05/06/23 107 lb (48.5 kg)    VITALS: BP 106/70 (BP Location: Left Arm, Patient Position: Sitting)   Pulse 70   Ht 4\' 11"  (1.499 m)   Wt 107 lb (48.5 kg)   BMI 21.61 kg/m   EXAM: Deferred  EKG: Deferred  ASSESSMENT: Mixed dyslipidemia with high triglycerides goal LDL less than 70 Elevated WU(J)-811.9 nmol/L Type 2  diabetes-A1c 7.8% Aortoiliac atherosclerosis Hypertension  PLAN: 1.   Ms. Losee has a mixed dyslipidemia with high triglycerides and is above a target LDL less than 70.  No significant improvement on high intensity statin therapy.  Would recommend continuing rosuvastatin 20 mg daily but will add a PCSK9 inhibitor.  Will reach out for prior authorization for this.  This should also help hopefully with her LP(a).  Plan repeat lipid NMR and LP(a) on therapy in about 3 to 4 months and follow-up afterwards with Marcelino Duster and [name].  Chrystie Nose, MD, [facility], FACP  [place]  [facility]  Medical Director of the Advanced Lipid Disorders &  Cardiovascular Risk Reduction Clinic Diplomate of the American Board of Clinical Lipidology Attending Cardiologist  Direct Dial: [phone]  Fax: [phone]  Website:  [email or web address] 09/23/2023, 10:37 AM

## 2023-09-23 NOTE — Telephone Encounter (Signed)
 Patient Advocate Encounter   The patient was approved for a [employer] grant that will help cover the cost of repatha Total amount awarded, 2500.  Effective: 08/24/23 - 08/22/24   [id number] [id number] [employer] ID: [id number]   Pharmacy provided with approval and processing information.

## 2023-12-10 ENCOUNTER — Ambulatory Visit: Payer: Medicare Other | Admitting: Emergency Medicine

## 2023-12-22 ENCOUNTER — Encounter: Payer: Self-pay | Admitting: Emergency Medicine

## 2023-12-22 ENCOUNTER — Ambulatory Visit (INDEPENDENT_AMBULATORY_CARE_PROVIDER_SITE_OTHER): Admitting: Emergency Medicine

## 2023-12-22 VITALS — BP 138/82 | HR 74 | Temp 98.4°F | Resp 16 | Ht 59.0 in | Wt 103.6 lb

## 2023-12-22 DIAGNOSIS — E785 Hyperlipidemia, unspecified: Secondary | ICD-10-CM

## 2023-12-22 DIAGNOSIS — I152 Hypertension secondary to endocrine disorders: Secondary | ICD-10-CM | POA: Diagnosis not present

## 2023-12-22 DIAGNOSIS — Z7984 Long term (current) use of oral hypoglycemic drugs: Secondary | ICD-10-CM | POA: Diagnosis not present

## 2023-12-22 DIAGNOSIS — E1159 Type 2 diabetes mellitus with other circulatory complications: Secondary | ICD-10-CM | POA: Diagnosis not present

## 2023-12-22 DIAGNOSIS — N1831 Chronic kidney disease, stage 3a: Secondary | ICD-10-CM

## 2023-12-22 DIAGNOSIS — E1169 Type 2 diabetes mellitus with other specified complication: Secondary | ICD-10-CM | POA: Diagnosis not present

## 2023-12-22 LAB — CBC WITH DIFFERENTIAL/PLATELET
Basophils Absolute: 0 10*3/uL (ref 0.0–0.1)
Basophils Relative: 0.5 % (ref 0.0–3.0)
Eosinophils Absolute: 0.1 10*3/uL (ref 0.0–0.7)
Eosinophils Relative: 1.6 % (ref 0.0–5.0)
HCT: 36.2 % (ref 36.0–46.0)
Hemoglobin: 11.4 g/dL — ABNORMAL LOW (ref 12.0–15.0)
Lymphocytes Relative: 33.1 % (ref 12.0–46.0)
Lymphs Abs: 3.1 10*3/uL (ref 0.7–4.0)
MCHC: 31.6 g/dL (ref 30.0–36.0)
MCV: 73 fl — ABNORMAL LOW (ref 78.0–100.0)
Monocytes Absolute: 0.5 10*3/uL (ref 0.1–1.0)
Monocytes Relative: 5.6 % (ref 3.0–12.0)
Neutro Abs: 5.6 10*3/uL (ref 1.4–7.7)
Neutrophils Relative %: 59.2 % (ref 43.0–77.0)
Platelets: 234 10*3/uL (ref 150.0–400.0)
RBC: 4.96 Mil/uL (ref 3.87–5.11)
RDW: 13.3 % (ref 11.5–15.5)
WBC: 9.4 10*3/uL (ref 4.0–10.5)

## 2023-12-22 LAB — MICROALBUMIN / CREATININE URINE RATIO
Creatinine,U: 36.5 mg/dL
Microalb Creat Ratio: 25.6 mg/g (ref 0.0–30.0)
Microalb, Ur: 0.9 mg/dL (ref 0.0–1.9)

## 2023-12-22 NOTE — Assessment & Plan Note (Signed)
 BP Readings from Last 3 Encounters:  12/22/23 138/82  09/23/23 106/70  06/11/23 (!) 160/80  Well-controlled hypertension Continue lisinopril  20 mg daily Hemoglobin A1c is at 7.4.  Acceptable level Continue Farxiga  10 mg daily and glipizide  5 mg twice a day Diet and nutrition discussed Cardiovascular risks associated with hypertension and diabetes discussed Follow-up in 6 months

## 2023-12-22 NOTE — Assessment & Plan Note (Signed)
Advised to stay well-hydrated and avoid NSAIDs. Continue Farxiga 10 mg daily 

## 2023-12-22 NOTE — Patient Instructions (Signed)
B?nh ti?u ???ng v dinh d??ng, ng??i l?n Diabetes Mellitus and Nutrition, Adult Khi qu v? b? ti?u ???ng hay b?nh ti?u ???ng, ?i?u r?t quan tr?ng l ph?i c thi quen ?n u?ng t?t cho s?c kh?e b?i v nh?ng g qu v? ?n v u?ng ?nh h??ng l?n ??n n?ng ?? ???ng huy?t (glucose) c?a qu v?. ?n th?c ph?m t?t cho s?c kh?e v?i s? l??ng v?a ph?i, vo cng cc th?i ?i?m m?i ngy, c th? gip qu v?: Qu?n l ???ng huy?t. Gi?m nguy c? b? b?nh tim. C?i thi?n huy?t p. ??t ???c ho?c duy tr m??c cn n?ng c l?i cho s?c kh?e. ?i?u g c th? ?nh h??ng ??n k? ho?ch ?n u?ng c?a ti? M?i ng??i b? ti?u ???ng khc nhau v m?i ng??i ??u c nhu c?u v? ch??ng trnh ?n u?ng khc nhau. Chuyn gia ch?m sc s?c kh?e c?a qu v? c th? khuyn qu v? trao ??i v?i chuyn gia dinh d??ng ?? xy d?ng ch??ng trnh ?n u?ng t?t nh?t cho qu v?. Ch??ng trnh ?n u?ng c?a qu v? c th? thay ??i ty thu?c vo cc y?u t? nh?: L??ng calo quy? vi? c?n. Cc lo?i thu?c m quy? vi? du?ng. Cn n??ng cu?a quy? vi?. N?ng ?? ???ng huy?t, huy?t p v cholesterol c?a qu v?. M?c ?? ho?t ??ng c?a qu v?. Cc tnh tr?ng s?c kh?e khc m qu v? c, ch?ng h?n nh? b?nh tim ho?c b?nh th?n. Carbohydrate ?nh h??ng nh? th? no ??n ti? Carbohydrate, hay cn g?i l carb, ?nh h??ng ??n l??ng ???ng huy?t c?a quy? vi? nhi?u h?n b?t k? lo?i th?c ph?m no khc. ?n carb lm t?ng l??ng glucose trong mu. Bi?t l??ng carb m qu v? c th? ?n m?t cch an ton trong m?i b?a ?n c vai tr quan tr?ng. L??ng carbohydrate ny khc nhau v?i m?i ng??i. Chuyn gia dinh d??ng c?a qu v? c th? gip tnh ton l??ng carb m qu v? c?n ph?i ?n vo m?i b?a ?n chnh v m?i b?a ?n nh?. R??u ?nh h??ng ??n ti nh? th? no? R??u c th? gy gi?m ???ng huy?t (h? ???ng huy?t), ??c bi?t l n?u qu v? s? d?ng insulin ho?c u?ng m?t s? lo?i thu?c nh?t ??nh ?i?u tr? ti?u ???ng. H? ???ng huy?t c th? l m?t tnh tr?ng ?e d?a tnh ma?ng. Cc tri?u ch?ng c?a h? ???ng huy?t, ch?ng h?n bu?n ng?, chng  m?t v l l?n, t??ng t? cc tri?u ch?ng c?a vi?c u?ng qu nhi?u r??u. Khng u?ng r??u n?u: Chuyn gia ch?m sc s?c kh?e khuyn qu v? khng u?ng r??u. Qu v? c thai, c th? c thai, ho?c c k? ho?ch c thai. N?u qu v? u?ng r??u: Gi?i h?n l??ng r??u qu v? u?ng ? m?c: 0-1 ly/ngy ??i v?i n? gi?i. 0-2 ly/ngy ??i v?i nam gi?i. Bi?t m?t ly c bao nhiu r??u. ? M?, m?t ly t??ng ???ng v?i m?t chai bia 12 ao-x? (355 mL), m?t ly r??u vang 5 ao-x? (148 mL), ho?c m?t ly r??u m?nh 1 ao-x? (44 mL). Lun b ?? n??c b?ng n??c, soda dnh cho ng??i ?n king, ho?c tr ? khng ???ng. Lun nh? r?ng soda thng th???ng, n??c tri cy v ca?c ?? u?ng h?n h??p khc c th? ch?a nhi?u ???ng v ph?i ???c tnh l carb. C nh?ng l?i khuyn no ?? tun th? k? ho?ch ny?  ??c nhn th?c ph?m B?t ??u b?ng vi?c ki?m tra kch c? kh?u ph?n trn nhn Thng tin dinh d??ng c?a th?c   ph?m v ?? u?ng ?ng gi. S? calo v l??ng carb, ch?t bo v cc ch?t dinh d??ng khc ghi trn nhn d?a trn m?t kh?u ph?n c?a mn ?. Nhi?u mn ch?a nhi?u h?n m?t kh?u ph?n trn m?i bao b. Ki?m tra t?ng s? gram (g) carb trong m?t kh?u ph?n. Ki?m tra s? gram ch?t bo bo ha v ch?t bo chuy?n ha trong m?t kh?u ph?n. Ch?n th?c ph?m c t ho?c khng c nh?ng ch?t bo ny. Ki?m tra s? miligam (mg) mu?i (natri) trong m?t kh?u ph?n. H?u h?t m?i ng??i ??u nn gi?i h?n t?ng natri tiu thu? ?? m??c d??i 2.300 mg m?i ngy. Lun ki?m tra thng tin dinh d??ng c?a th?c ph?m ghi trn nhn l "t bo" hay "khng bo". Nh?ng th?c ph?m ny c th? c b? sung ???ng ho?c carb tinh luy?n cao h?n v c?n ph?i trnh. Hy trao ??i v?i chuyn gia dinh d??ng c?a qu v? ?? xc ??nh m?c tiu hng ngy c?a qu v? ??i v?i cc ch?t dinh d??ng li?t k trn nhn. Mua s?m Trnh mua nh?ng th?c ph?m ?ng h?p, lm s?n ho?c ch? bi?n s?n. Nh?ng th?c ph?m ny c xu h??ng c nhi?u ch?t bo, natri, b? sung ???ng. Mua s?m quanh ra ngoi c?a c?a hng t?p ha. ?y l n?i qu v? s? d?  tm th?y tri cy v rau c? t??i, nhi?u lo?i h?t, th?t t??i v s?n ph?m s?a t??i. N?u n??ng S? d?ng ca?c ph??ng php n?u ?n nhi?t ?? th?p, ch?ng h?n nh? n??ng, thay v ph??ng php n?u nhi?t ?? cao nh? chin ng?p d?u. N?u ?n b?ng cch s? d?ng cc lo?i d?u t?t cho s?c kh?e, ch?ng h?n nh? d?u  liu, d?u h?t c?i d?u ho?c d?u h??ng d??ng. Trnh n?u v?i b?, kem, ho?c th?t nhi?u m?. Ln k? ho?ch cho b?a ?n ?n cc b?a ?n chnh v cc b?a ?n nh? ??u ??n, t?t nh?t l vo cng m?t th?i ?i?m m?i ngy. Trnh nhi?n ?n trong th?i gian di. ?n th?c ?n giu ch?t x? nh? cc lo?i tri cy t??i, rau c?, ??u v ng? c?c nguyn cm. ?n 4-6 ao-x? (oz) (112-168 g) protein n?c m?i ngy, ch?ng h?n nh? th?t n?c, th?t g, c, tr?ng, ho?c ??u ph?. M?t ao-x? (oz) (28 g) protein n?c b?ng: 1 ao-x? (oz) (28 g) th?t, th?t g ho?c c. 1 qu? tr?ng. 1?4 c?c (62 g) ??u ph?. ?n m?t s? th?c ph?m ch?a cc ch?t bo lnh m?nh m?i ngy, ch?ng h?n nh? qu? b?, qu? h?ch, cc lo?i h?t v c. Ti nn ?n nh?ng th?c ph?m no? Tri cy Qu? m?ng. To. Cam. Qu? ?o. Qu? m?. Qu? m?n. Nho. Xoi. Qu? ?u ??. Qu? l?u. Qu? kiwi. Qu? anh ?o. Rau c? Rau xanh, bao g?m c?i xo?n, rau bina, kale, c?i c?u v?ng, c?i r? c?i b? xanh v c?i b?p. C? c?i ???ng. Sp l?. Bng c?i xanh. C r?t. ??u xanh. C chua. ?t ng?t. Hnh ty. D?a chu?t. C?i bruxen. Ng? c?c Ng? c?c nguyn ca?m nh? bnh m nguyn ca?m ho??c nguyn h?t, bnh quy gin, bnh m ng, ng? c?c v m ?ng. B?t y?n m?ch khng ???ng. H?t dim m?ch (quinoa). G?o l?c ho?c g?o khng xt. Th?t v cc protein khc H?i s?n. Th?t gia c?m khng da. Mi?ng n?c th?t gia c?m v th?t b. ??u ph?. Qu? h?ch. Cc lo?i h?t. S?a Cc s?n ph?m s??a khng co? ho??c co? i?t ch?t be?o, ch?ng h?n nh? s?a, s??a   chua va? pho mt. Nh?ng th?c ph?m li?t k ? trn c th? khng ph?i l m?t danh m?c ??y ?? cc th?c ph?m v ?? u?ng m qu v? c th? ?n v u?ng. Lin h? v?i chuyn gia dinh d??ng ?? c thm thng tin. Ti nn trnh  nh?ng th?c ph?m no? Tri cy Tri cy ?ng h?p xi-r. Rau c? Rau c? ?ng h?p. Rau ?ng l?nh v?i b? ho?c n??c s?t kem. Ng? c?c Cc s?n ph?m lm t? b?t m tr?ng v b?t m ???c tinh ch? nh? bnh m, m ?ng, ?? ?n nh? v ng? c?c. Trnh t?t c? cc th?c ph?m ch? bi?n s?n. Th?t v cc protein khc Cc la?t thi?t m??. Th?t gia c?m c da. Th?t chin/rn ho?c t?m b?t rn. Th?t ch? bi?n s?n. Trnh cc ch?t bo bo ha. S?a S?a chua nguyn ch?t bo, pho mt ho?c s?a. ?? u?ng ?? u?ng c ???ng, ch?ng h?n nh? soda ho?c tr ?. Nh?ng th?c ph?m li?t k ? trn c th? khng ph?i l m?t danh m?c ??y ?? cc lo?i th?c ph?m v ?? u?ng m qu v? nn trnh. Lin h? v?i chuyn gia dinh d??ng ?? c thm thng tin. Ca?c cu h?i c?n ??a ra v?i chuyn gia ch?m sc s?c kh?e Ti c c?n g?p chuyn gia gio d?c v ch?m sc b?nh ti?u ???ng c ch?ng nh?n khng? Ti c c?n g?p chuyn gia dinh d??ng khng? Ti co? th? go?i cho s? na?o n?u ti co? th?c m?c? Khi no l th?i ?i?m ki?m tra ???ng huy?t t?t nh?t? N?i ?? tm thm thng tin: American Diabetes Association (Hi?p h?i Ti?u ????ng Hoa K?): [email or web address] Academy of Nutrition and Dietetics (H?c vi?n Dinh d??ng v ?n u?ng): [email or web address] National Institute of Diabetes and Digestive and Kidney Diseases (Vi?n Ti?u ???ng v cc B?nh Tiu ha v B?nh Th?n Qu?c gia): [email or web address] Association of Diabetes [employer] Education [employer]ists (Hi?p h?i Chuyn gia Gio d?c v Ch?m sc B?nh ti?u ???ng): [email or web address] Tm t?t ?i?u r?t quan tr?ng l ph?i c thi quen ?n u?ng t?t cho s?c kh?e b?i v nh?ng g qu v? ?n v u?ng ?nh h??ng l?n ??n n?ng ?? ???ng huy?t (glucose) c?a qu v?. ?i?u quan tr?ng l s? d?ng m?t r??u cch th?n tr?ng. M?t k? ho?ch ?n u?ng t?t cho s?c kh?e s? gip qu v? ki?m sot ???ng huy?t v gi?m nguy c? m?c b?nh tim. Chuyn gia ch?m sc s?c kh?e c?a qu v? c th? khuyn qu v? trao ??i v?i chuyn gia dinh d??ng ?? xy d?ng ch??ng trnh ?n u?ng t?t nh?t cho qu  v?. Thng tin ny khng nh?m m?c ?ch thay th? cho l?i khuyn m chuyn gia ch?m sc s?c kh?e ni v?i qu v?. Hy b?o ??m qu v? ph?i th?o lu?n b?t k? v?n ?? g m qu v? c v?i chuyn gia ch?m sc s?c kh?e c?a qu v?. Document Revised: 11/16/2020 Document Reviewed: 11/16/2020 [employer] Patient Education  2024 [employer]  

## 2023-12-22 NOTE — Assessment & Plan Note (Signed)
 Chronic stable conditions Hemoglobin A1c is 7.4 Continue Farxiga  and glipizide  Continue rosuvastatin  20 mg daily and Repatha  140 mg every 14 days Last cardiologist office visit notes reviewed

## 2023-12-22 NOTE — Progress Notes (Signed)
 Cathy Robbins 68 y.o.   Chief Complaint  Patient presents with   Hypertension    Here for follow up   Diabetes    Here for follow up    HISTORY OF PRESENT ILLNESS: This is a 68 y.o. female here for follow-up of chronic medical conditions including diabetes, hypertension, dyslipidemia Overall doing well.  Has no complaints or any other medical concerns. Lab Results  Component Value Date   HGBA1C 7.6 (A) 06/11/2023   BP Readings from Last 3 Encounters:  09/23/23 106/70  06/11/23 (!) 160/80  05/06/23 (!) 112/50   Wt Readings from Last 3 Encounters:  09/23/23 107 lb (48.5 kg)  06/11/23 107 lb (48.5 kg)  05/06/23 107 lb (48.5 kg)   Was seen by cardiologist for evaluation of dyslipidemia.  Assessment and plan as follows:  ASSESSMENT: Mixed dyslipidemia with high triglycerides goal LDL less than 70 Elevated OE(j)-857.1 nmol/L Type 2 diabetes-A1c 7.8% Aortoiliac atherosclerosis Hypertension   PLAN: 1.   Cathy Robbins has a mixed dyslipidemia with high triglycerides and is above a target LDL less than 70.  No significant improvement on high intensity statin therapy.  Would recommend continuing rosuvastatin  20 mg daily but will add a PCSK9 inhibitor.  Will reach out for prior authorization for this.  This should also help hopefully with her LP(a).  Plan repeat lipid NMR and LP(a) on therapy in about 3 to 4 months and follow-up afterwards with Cathy Robbins and a Cathy Robbins.   Cathy KYM Maxcy, MD, [facility], FACP  [place]  [facility]  Medical Director of the Advanced Lipid Disorders &  Cardiovascular Risk Reduction Clinic Diplomate of the American Board of Clinical Lipidology Attending Cardiologist    Hypertension Pertinent negatives include no chest pain, headaches, palpitations or shortness of breath.  Diabetes Pertinent negatives for hypoglycemia include no dizziness or headaches. Pertinent negatives for diabetes include no chest pain.     Prior to Admission medications    Medication Sig Start Date End Date Taking? Authorizing Provider  Evolocumab  (REPATHA  SURECLICK) 140 MG/ML SOAJ Inject 140 mg into the skin every 14 (fourteen) days. 09/23/23  Yes Cathy Robbins, Cathy BROCKS, MD  FARXIGA  10 MG TABS tablet TAKE 1 TABLET BY MOUTH DAILY BEFORE BREAKFAST. 04/02/23  Yes Cathy Robbins, Cathy Schanz, MD  glipiZIDE  (GLUCOTROL ) 5 MG tablet TAKE 1 TABLET (5 MG TOTAL) BY MOUTH TWICE A DAY BEFORE MEALS 05/22/23  Yes Monik Lins, Cathy Schanz, MD  lisinopril  (ZESTRIL ) 20 MG tablet TAKE 1 TABLET BY MOUTH EVERY DAY 02/22/23  Yes Cathy Robbins, Cathy Schanz, MD  rosuvastatin  (CRESTOR ) 20 MG tablet Take 1 tablet (20 mg total) by mouth daily. 05/06/23  Yes Cathy Robbins, Cathy BROCKS, MD    No Known Allergies  Patient Active Problem List   Diagnosis Date Noted   Non-English speaking patient (Vietnamese) 09/12/2022   Anemia 09/12/2022   Stage 3a chronic kidney disease (HCC) 06/04/2022   Uncontrolled type 2 diabetes mellitus with hyperglycemia (HCC) 01/03/2022   Hypertension associated with diabetes (HCC) 01/03/2022   Chronic pain of left lower extremity 07/14/2019   Positive ANA (antinuclear antibody) 07/14/2019   Dyslipidemia associated with type 2 diabetes mellitus (HCC) 11/20/2015   Hyperlipidemia 11/20/2015    Past Medical History:  Diagnosis Date   Chronic pain of left lower extremity 07/14/2019   Diabetes mellitus without complication (HCC)    History of COVID-19 09/12/2022   Hyperlipidemia 11/20/2015   Hypertension associated with diabetes (HCC) 01/03/2022   Stage 3a chronic kidney disease (HCC) 06/04/2022  Past Surgical History:  Procedure Laterality Date   CHOLECYSTECTOMY N/A 09/14/2022   Procedure: LAPAROSCOPIC CHOLECYSTECTOMY WITH INTRAOPERATIVE CHOLANGIOGRAM;  Surgeon: Vanderbilt Ned, MD;  Location: [facility];  Service: General;  Laterality: N/A;    Social History   Socioeconomic History   Marital status: Married    Spouse name: Not on file   Number of children: 4   Years of education:  Not on file   Highest education level: Not on file  Occupational History   Not on file  Tobacco Use   Smoking status: Never   Smokeless tobacco: Never  Vaping Use   Vaping status: Never Used  Substance and Sexual Activity   Alcohol  use: No   Drug use: No   Sexual activity: Not Currently  Other Topics Concern   Not on file  Social History Narrative   Not on file   Social Drivers of Health   Financial Resource Strain: Low Risk  (01/12/2019)   Overall Financial Resource Strain (CARDIA)    Difficulty of Paying Living Expenses: Not hard at all  Food Insecurity: No Food Insecurity (09/13/2022)   Hunger Vital Sign    Worried About Running Out of Food in the Last Year: Never true    Ran Out of Food in the Last Year: Never true  Transportation Needs: No Transportation Needs (09/13/2022)   PRAPARE - Administrator, Civil Service (Medical): No    Lack of Transportation (Non-Medical): No  Physical Activity: Sufficiently Active (01/12/2019)   Exercise Vital Sign    Days of Exercise per Week: 5 days    Minutes of Exercise per Session: 30 min  Stress: No Stress Concern Present (01/12/2019)   [employer] of Occupational Health - Occupational Stress Questionnaire    Feeling of Stress : Not at all  Social Connections: Moderately Isolated (01/12/2019)   Social Connection and Isolation Panel    Frequency of Communication with Friends and Family: Twice a week    Frequency of Social Gatherings with Friends and Family: Once a week    Attends Religious Services: Never    Database administrator or Organizations: No    Attends Banker Meetings: Never    Marital Status: Married  Catering manager Violence: Not At Risk (09/13/2022)   Humiliation, Afraid, Rape, and Kick questionnaire    Fear of Current or Ex-Partner: No    Emotionally Abused: No    Physically Abused: No    Sexually Abused: No    History reviewed. No pertinent family history.   Review of Systems   Constitutional: Negative.  Negative for chills and fever.  HENT: Negative.  Negative for congestion and sore throat.   Respiratory: Negative.  Negative for cough and shortness of breath.   Cardiovascular: Negative.  Negative for chest pain and palpitations.  Gastrointestinal:  Negative for abdominal pain, diarrhea, nausea and vomiting.  Genitourinary: Negative.  Negative for dysuria and hematuria.  Skin: Negative.  Negative for rash.  Neurological: Negative.  Negative for dizziness and headaches.  All other systems reviewed and are negative.   Today's Vitals   12/22/23 1427  BP: 138/82  Pulse: 74  Resp: 16  Temp: 98.4 F (36.9 C)  TempSrc: Oral  SpO2: 99%  Weight: 103 lb 9.6 oz (47 kg)  Height: 4' 11 (1.499 m)   Body mass index is 20.92 kg/m.   Physical Exam Vitals reviewed.  Constitutional:      Appearance: Normal appearance.  HENT:     Head:  Normocephalic.     Mouth/Throat:     Mouth: Mucous membranes are moist.     Pharynx: Oropharynx is clear.   Eyes:     Extraocular Movements: Extraocular movements intact.     Pupils: Pupils are equal, round, and reactive to light.    Cardiovascular:     Rate and Rhythm: Normal rate and regular rhythm.     Pulses: Normal pulses.     Heart sounds: Normal heart sounds.  Pulmonary:     Effort: Pulmonary effort is normal.     Breath sounds: Normal breath sounds.   Musculoskeletal:     Cervical back: No tenderness.  Lymphadenopathy:     Cervical: No cervical adenopathy.   Skin:    General: Skin is warm and dry.     Capillary Refill: Capillary refill takes less than 2 seconds.   Neurological:     General: No focal deficit present.     Mental Status: She is alert and oriented to person, place, and time.   Psychiatric:        Mood and Affect: Mood normal.        Behavior: Behavior normal.      ASSESSMENT & PLAN: A total of 45 minutes was spent with the patient and counseling/coordination of care regarding  preparing for this visit, review of most recent office visit notes, review of multiple chronic medical conditions and their management, cardiovascular risks associated with hypertension and diabetes, review of all medications, review of most recent bloodwork results including interpretation of today's hemoglobin A1c, review of health maintenance items, education on nutrition, prognosis, documentation, and need for follow up.   Problem List Items Addressed This Visit       Cardiovascular and Mediastinum   Hypertension associated with diabetes (HCC) - Primary   BP Readings from Last 3 Encounters:  12/22/23 138/82  09/23/23 106/70  06/11/23 (!) 160/80  Well-controlled hypertension Continue lisinopril  20 mg daily Hemoglobin A1c is at 7.4.  Acceptable level Continue Farxiga  10 mg daily and glipizide  5 mg twice a day Diet and nutrition discussed Cardiovascular risks associated with hypertension and diabetes discussed Follow-up in 6 months       Relevant Orders   Microalbumin / creatinine urine ratio   Comprehensive metabolic panel with GFR   CBC with Differential/Platelet     Endocrine   Dyslipidemia associated with type 2 diabetes mellitus (HCC)   Chronic stable conditions Hemoglobin A1c is 7.4 Continue Farxiga  and glipizide  Continue rosuvastatin  20 mg daily and Repatha  140 mg every 14 days Last cardiologist office visit notes reviewed      Relevant Orders   Microalbumin / creatinine urine ratio   Comprehensive metabolic panel with GFR   CBC with Differential/Platelet     Genitourinary   Stage 3a chronic kidney disease (HCC)   Advised to stay well-hydrated and avoid NSAIDs Continue Farxiga  10 mg daily      Relevant Orders   Microalbumin / creatinine urine ratio   Comprehensive metabolic panel with GFR   CBC with Differential/Platelet   Patient Instructions  B?nh ti?u ???ng v dinh d??ng, ng??i l?n Diabetes Mellitus and Nutrition, Adult Khi qu v? b? ti?u ???ng hay  b?nh ti?u ???ng, ?i?u r?t quan tr?ng l ph?i c thi quen ?n u?ng t?t cho s?c kh?e b?i v nh?ng g qu v? ?n v u?ng ?nh h??ng l?n ??n n?ng ?? ???ng huy?t (glucose) c?a qu v?. ?n th?c ph?m t?t cho s?c kh?e v?i s? l??ng v?a ph?i, vo cng  cc th?i ?i?m m?i ngy, c th? gip qu v?: Qu?n l ???ng huy?t. Gi?m nguy c? b? b?nh tim. C?i thi?n huy?t p. ??t ???c ho?c duy tr m??c cn n?ng c l?i cho s?c kh?e. ?i?u g c th? ?nh h??ng ??n k? ho?ch ?n u?ng c?a ti? M?i ng??i b? ti?u ???ng khc nhau v m?i ng??i ??u c nhu c?u v? ch??ng trnh ?n u?ng khc nhau. Chuyn gia ch?m [place] s?c kh?e c?a qu v? c th? [place] qu v? trao ??i v?i chuyn gia dinh d??ng ?? xy d?ng ch??ng trnh ?n u?ng t?t nh?t cho qu v?. Ch??ng trnh ?n u?ng c?a qu v? c th? thay ??i ty thu?c vo cc y?u t? nh?: L??ng calo quy? vi? c?n. Cc lo?i thu?c m quy? vi? du?ng. Cn n??ng cu?a quy? vi?. N?ng ?? ???ng huy?t, huy?t p v cholesterol c?a qu v?. M?c ?? ho?t ??ng c?a qu v?. Cc tnh tr?ng s?c kh?e khc m qu v? c, ch?ng h?n nh? b?nh tim ho?c b?nh th?n. Carbohydrate ?nh h??ng nh? th? no ??n ti? Carbohydrate, hay cn g?i l carb, ?nh h??ng ??n l??ng ???ng huy?t c?a quy? vi? nhi?u h?n b?t k? lo?i th?c ph?m no khc. ?n carb lm t?ng l??ng glucose trong mu. Bi?t l??ng carb m qu v? c th? ?n m?t cch an ton trong m?i b?a ?n c vai tr quan tr?ng. L??ng carbohydrate ny khc nhau v?i m?i ng??i. Chuyn gia dinh d??ng c?a qu v? c th? gip tnh ton l??ng carb m qu v? c?n ph?i ?n vo m?i b?a ?n chnh v m?i b?a ?n nh?. R??u ?nh h??ng ??n ti nh? th? no? R??u c th? gy gi?m ???ng huy?t (h? ???ng huy?t), ??c bi?t l n?u qu v? s? d?ng insulin  ho?c u?ng m?t s? lo?i thu?c nh?t ??nh ?i?u tr? ti?u ???ng. H? ???ng huy?t c th? l m?t tnh tr?ng ?e d?a tnh Dorce?ng. Cc tri?u ch?ng c?a h? ???ng huy?t, ch?ng h?n bu?n ng?, chng m?t v l l?n, t??ng t? cc tri?u ch?ng c?a vi?c u?ng qu nhi?u r??u. Khng u?ng r??u n?u: Chuyn gia ch?m  [place] s?c kh?e khuyn qu v? khng u?ng r??u. Qu v? c [place], c th? c [place], ho?c c k? ho?ch c [place]. N?u qu v? u?ng r??u: Gi?i h?n l??ng r??u qu v? u?ng ? m?c: 0-1 ly/ngy ??i v?i n? gi?i. 0-2 ly/ngy ??i v?i nam gi?i. Bi?t m?t ly c bao nhiu r??u. ? M?, m?t ly t??ng ???ng v?i m?t chai bia 12 ao-x? (355 mL), m?t ly r??u vang 5 ao-x? (148 mL), ho?c m?t ly r??u m?nh 1 ao-x? (44 mL). Lun b ?? n??c b?ng n??c, soda dnh cho ng??i ?n king, ho?c tr ? khng ???ng. [place]iesa nh? r?ng soda thng th???ng, n??c tri cy v ca?c ?? u?ng h?n h??p khc c th? ch?a nhi?u ???ng v ph?i ???c tnh l carb. C nh?ng l?i khuyn no ?? tun th? k? ho?ch ny?  ??c nhn th?c ph?m B?t ??u b?ng vi?c ki?m tra kch c? kh?u ph?n trn nhn Thng tin dinh d??ng c?a th?c ph?m v ?? u?ng ?ng gi. S? calo v l??ng carb, ch?t bo v cc ch?t dinh d??ng khc ghi trn nhn d?a trn m?t kh?u ph?n c?a mn ?. Nhi?u mn ch?a nhi?u h?n m?t kh?u ph?n trn m?i bao b. Ki?m tra t?ng s? gram (g) carb trong m?t kh?u ph?n. Ki?m tra s? gram ch?t bo bo ha v ch?t bo chuy?n ha trong m?t kh?u ph?n. Ch?n th?c ph?m  c t ho?c khng c nh?ng ch?t bo ny. Ki?m tra s? miligam (mg) mu?i (natri) trong m?t kh?u ph?n. H?u h?t m?i ng??i ??u nn gi?i h?n t?ng natri tiu thu? ?? m??c d??i 2.300 mg m?i ngy. Lun ki?m tra thng tin dinh d??ng c?a th?c ph?m ghi trn nhn l t bo hay khng bo. Nh?ng th?c ph?m ny c th? c b? sung ???ng ho?c carb tinh luy?n cao h?n v c?n ph?i trnh. Hy trao ??i v?i chuyn gia dinh d??ng c?a qu v? ?? xc ??nh m?c tiu hng ngy c?a qu v? ??i v?i cc ch?t dinh d??ng li?t k trn nhn. Mua s?m Trnh mua nh?ng th?c ph?m ?ng h?p, lm s?n ho?c ch? bi?n s?n. Nh?ng th?c ph?m ny c xu h??ng c nhi?u ch?t bo, natri, b? sung ???ng. Mua s?m quanh ra ngoi c?a c?a hng t?p ha. ?y l n?i qu v? s? d? tm th?y tri cy v rau c? t??i, nhi?u lo?i h?t, th?t t??i v s?n ph?m s?a t??i. N?u n??ng S? d?ng ca?c  ph??ng php n?u ?n nhi?t ?? th?p, ch?ng h?n nh? n??ng, thay v ph??ng php n?u nhi?t ?? cao nh? chin ng?p d?u. N?u ?n b?ng cch s? d?ng cc lo?i d?u t?t cho s?c kh?e, ch?ng h?n nh? d?u  liu, d?u h?t c?i d?u ho?c d?u h??ng d??ng. Rudi n?u v?i b?, kem, ho?c th?t nhi?u m?. Ln k? ho?ch cho b?a ?n ?n cc b?a ?n chnh v cc b?a ?n nh? ??u ??n, t?t nh?t l vo cng m?t th?i ?i?m m?i ngy. Trnh nhi?n ?n trong th?i [employer]. ?n th?c ?n giu ch?t x? nh? cc lo?i tri cy t??i, rau c?, ??u v ng? c?c nguyn cm. ?n 4-6 ao-x? (oz) (112-168 g) protein n?c m?i ngy, ch?ng h?n nh? th?t n?c, th?t g, c, tr?ng, ho?c ??u ph?. M?t ao-x? (oz) (28 g) protein n?c b?ng: 1 ao-x? (oz) (28 g) th?t, th?t g ho?c c. 1 qu? tr?ng. 1?4 c?c (62 g) ??u ph?. ?n m?t s? th?c ph?m ch?a cc ch?t bo lnh m?nh m?i ngy, ch?ng h?n nh? qu? b?, qu? h?ch, cc lo?i h?t v c. Ti nn ?n nh?ng th?c ph?m no? Tri cy Qu? m?ng. To. CamSABRA Do? ?o. Qu? m?. Qu? m?n. Nho. Xoi. Qu? ?u ??SABRA Do? l?u. Qu? kiwi. Qu? anh ?o. Kathryne c? Kathryne acre, bao g?m c?i xo?n, rau bina, kale, c?i c?u v?ng, c?i r? c?i b? xanh v c?i b?p. C? c?i ???ng. Sp l?. Bng c?i xanh. C r?t. ??u xanh. C chua. ?t ng?t. Hnh ty. D?a chu?t. C?i bruxen. Ng? c?c Ng? c?c nguyn ca?m nh? bnh m nguyn ca?m ho??c nguyn h?t, bnh quy gin, bnh m ng, ng? c?c v m ?ng. B?t y?n m?ch khng ???ng. H?t dim m?ch (quinoa). G?o l?c ho?c g?o khng xt. Th?t v cc protein khc H?i s?n. Th?t gia c?m khng da. Mi?ng n?c th?t gia c?m v th?t b. ??u ph?. Qu? h?ch. Cc lo?i h?t. S?a Cc s?n ph?m s??a khng co? ho??c co? i?t ch?t be?o, ch?ng h?n nh? s?a, s??a chua va? pho mt. Nh?ng th?c ph?m li?t k ? trn c th? khng ph?i l m?t danh m?c ??y ?? cc th?c ph?m v ?? u?ng m qu v? c th? ?n v u?ng. Lin h? v?i chuyn gia dinh d??ng ?? c thm thng tin. Ti nn trnh nh?ng th?c ph?m no? Tri cy Tri cy ?ng h?p xi-r. Kathryne c? Kathryne c? ?ng h?p. Kathryne ?ng l?nh v?i b? ho?c  n??c s?t kem. Ng? c?c Cc s?n ph?m lm t? b?t m tr?ng v b?t m ???c tinh ch? nh? bnh m, m ?ng, ?? ?n nh? v ng? c?c. Trnh t?t c? cc th?c ph?m ch? bi?n s?n. Th?t v cc protein khc Cc la?t thi?t m??. Th?t gia c?m c da. Th?t chin/rn ho?c t?m b?t rn. Th?t ch? bi?n s?n. Trnh cc ch?t bo bo ha. S?a S?a chua nguyn ch?t bo, pho mt ho?c s?a. ?? u?ng ?? u?ng c ???ng, ch?ng h?n nh? soda ho?c tr ?SABRA Nh?ng th?c ph?m li?t k ? trn c th? khng ph?i l m?t danh m?c ??y ?? cc lo?i th?c ph?m v ?? u?ng m qu v? nn trnh. Lin h? v?i chuyn gia dinh d??ng ?? c thm thng tin. Ca?c cu h?i c?n ??a ra v?i chuyn gia ch?m [place] s?c kh?e Ti c c?n g?p chuyn gia gio d?c v ch?m [place] b?nh ti?u ???ng c ch?ng nh?n khng? Ti c c?n g?p chuyn gia dinh d??ng khng? Ti co? th? go?i cho s? na?o n?u ti co? th?c m?c? Khi no l th?i ?i?m ki?m tra ???ng huy?t t?t nh?t? N?i ?? tm thm thng tin: American Diabetes Association (Hi?p h?i Ti?u ????ng Hoa K?): diabetes.org Academy of Nutrition and Dietetics (H?c vi?n Dinh d??ng v ?n u?ng): eatright.[employer] of Diabetes and Digestive and Kidney Diseases (Vi?n Ti?u ???ng v cc B?nh Tiu ha v B?nh Th?n Qu?c gia): StageSync.si Association of Diabetes Care & Education Specialists (Hi?p h?i Chuyn gia Gio d?c v Ch?m [place] B?nh ti?u ???ng): [email or web address].org Tm t?t ?i?u r?t quan tr?ng l ph?i c thi quen ?n u?ng t?t cho s?c kh?e b?i v nh?ng g qu v? ?n v u?ng ?nh h??ng l?n ??n n?ng ?? ???ng huy?t (glucose) c?a qu v?. ?i?u quan tr?ng l s? d?ng m?t r??u cch th?n tr?ng. M?t k? ho?ch ?n u?ng t?t cho s?c kh?e s? gip qu v? ki?m sot ???ng huy?t v gi?m nguy c? m?c b?nh tim. Chuyn gia ch?m [place] s?c kh?e c?a qu v? c th? [place] qu v? trao ??i v?i chuyn gia dinh d??ng ?? xy d?ng ch??ng trnh ?n u?ng t?t nh?t cho qu v?. Thng tin ny khng nh?m m?c ?ch thay th? cho l?i khuyn m chuyn gia ch?m [place] s?c kh?e ni v?i qu  v?. Hy b?o ??m qu v? ph?i th?o lu?n b?t k? v?n ?? g m qu v? c v?i chuyn gia ch?m [place] s?c kh?e c?a qu v?. Document Revised: 11/16/2020 Document Reviewed: 11/16/2020 [employer] Patient Education  2024 [employer]    Cathy Schaumann, MD [facility]

## 2023-12-23 ENCOUNTER — Ambulatory Visit: Payer: Self-pay | Admitting: Emergency Medicine

## 2023-12-23 LAB — COMPREHENSIVE METABOLIC PANEL WITH GFR
ALT: 13 U/L (ref 0–35)
AST: 16 U/L (ref 0–37)
Albumin: 4.1 g/dL (ref 3.5–5.2)
Alkaline Phosphatase: 64 U/L (ref 39–117)
BUN: 23 mg/dL (ref 6–23)
CO2: 26 meq/L (ref 19–32)
Calcium: 9.8 mg/dL (ref 8.4–10.5)
Chloride: 102 meq/L (ref 96–112)
Creatinine, Ser: 1.18 mg/dL (ref 0.40–1.20)
GFR: 46.96 mL/min — ABNORMAL LOW (ref >60.00)
Glucose, Bld: 191 mg/dL — ABNORMAL HIGH (ref 70–99)
Potassium: 4.4 meq/L (ref 3.5–5.1)
Sodium: 138 meq/L (ref 135–145)
Total Bilirubin: 0.5 mg/dL (ref 0.2–1.2)
Total Protein: 8 g/dL (ref 6.0–8.3)

## 2024-01-21 NOTE — Progress Notes (Signed)
 Cardiology Office Note   Date:  01/29/2024  ID:  Cathy Robbins, Cathy Robbins March 30, 1956, MRN [id number] PCP: Purcell Emil Schanz, MD  [facility] Providers Cardiologist:  None     PMH Dyslipidemia Type 2 diabetes mellitus Aortoiliac atherosclerosis on CT Hypertension  Referred to Advanced lipid disorder clinic and seen by Dr. Mona 05/06/2023.  Pleasant [place] speaking female who was accompanied by professional translator and her daughter who is [place] speaking.  She was referred for longtime history of high triglycerides.  Also has a history of type 2 diabetes with recent improvement in hemoglobin A1c.  Previously advised to start Crestor  10 mg daily, however she did not start the medication.  Recent lipid testing revealed total cholesterol 249, triglycerides 661, HDL 34, and LDL not calculated due to high triglycerides. Generally eats a traditional [place] diet.  Very physically active at her job.  Prior CT of abdomen demonstrated aortoiliac atherosclerosis.  She was advised to start rosuvastatin  20 mg daily and limit carbohydrates and saturated fat.  Follow-up lipid panel 08/2023 revealed triglycerides still elevated at 689, total cholesterol 263, and LDL 76.  LP(a) elevated at 142.8 nmol/L.  She was advised to add Repatha  140 mg every 14 days in addition to rosuvastatin  20 mg daily.  History of Present Illness Discussed the use of AI scribe software for clinical note transcription with the patient, who gave verbal consent to proceed.  History of Present Illness She is a very pleasant 68 year old female who presents for a follow-up regarding her cholesterol management. She is accompanied by her daughter and we are assisted by language interpretor, Linh # [id number].  She was prescribed Repatha  but never got it from pharmacy.  There is a note in her chart that she received the health well grant which will fully cover the cost of Repatha .  We reviewed injection techniques and she feels  comfortable with proceeding with starting Repatha . She has no concerns about other medications or her general health. She remains active and states she is feeling well. She denies chest pain, shortness of breath, lower extremity edema, fatigue, palpitations, weakness, presyncope, syncope, orthopnea, and PND.    ROS: See HPI  Studies Reviewed      Lipoprotein (a)  Date/Time Value Ref Range Status  09/19/2023 10:21 AM 142.8 (H) <75.0 nmol/L Final    Comment:    Note:  Values greater than or equal to 75.0 nmol/L may        indicate an independent risk factor for CHD,        but must be evaluated with caution when applied        to non-Caucasian populations due to the        influence of genetic factors on Lp(a) across        ethnicities.     Risk Assessment/Calculations           Physical Exam VS:  BP (!) 112/56 (BP Location: Right Arm, Patient Position: Sitting)   Pulse 76   Resp 16   Ht 4' 11 (1.499 m)   Wt 103 lb 3.2 oz (46.8 kg)   SpO2 98%   BMI 20.84 kg/m    Wt Readings from Last 3 Encounters:  01/28/24 103 lb 3.2 oz (46.8 kg)  12/22/23 103 lb 9.6 oz (47 kg)  09/23/23 107 lb (48.5 kg)    GEN: Well nourished, well developed in no acute distress NECK: No JVD; No carotid bruits CARDIAC: RRR, no murmurs, rubs, gallops RESPIRATORY:  Clear to auscultation without rales, wheezing or rhonchi  ABDOMEN: Soft, non-tender, non-distended EXTREMITIES:  No edema; No deformity    Assessment & Plan Hyperlipidemia LDL goal < 70 Elevated LP(a) History of dyslipidemia with high triglycerides. LP(a) elevated at 142.8. LDL remained above goal of < 70 on Crestor  alone and she was advised to start Repatha , however she never received the medication from the pharmacy believing that the cost was prohibitive.  She has received a health well grant for full coverage of Repatha  and is agreeable to start.   -Start Repatha  140 mg subcu every 14 days -Reviewed injection technique with patient  and daughter -Samples provided -Recheck lipid panel in 3 months  Hypertension BP is well controlled. Renal function stable on labs completed 12/22/23. -Continue lisinopril .   Cardiac risk assessment We reviewed ASCVD risk factors including elevated LP(a), history of diabetes, and hyperlipidemia. She denies chest pain, dyspnea, or other symptoms concerning for angina.  No indication for further ischemic evaluation at this time. - Continue heart healthy diet limiting sodium, saturated fat, sugar, simple carbohydrates, and processed food -Remain physically active and aim to achieve at least 150 minutes of moderate intensity exercise each week         Dispo: 3 months with me  Signed, [name], NP-C

## 2024-01-22 ENCOUNTER — Encounter (HOSPITAL_BASED_OUTPATIENT_CLINIC_OR_DEPARTMENT_OTHER): Payer: Self-pay | Admitting: *Deleted

## 2024-01-27 ENCOUNTER — Encounter (HOSPITAL_BASED_OUTPATIENT_CLINIC_OR_DEPARTMENT_OTHER): Payer: Self-pay

## 2024-01-28 ENCOUNTER — Encounter (HOSPITAL_BASED_OUTPATIENT_CLINIC_OR_DEPARTMENT_OTHER): Payer: Self-pay | Admitting: Nurse Practitioner

## 2024-01-28 ENCOUNTER — Ambulatory Visit (HOSPITAL_BASED_OUTPATIENT_CLINIC_OR_DEPARTMENT_OTHER): Admitting: Nurse Practitioner

## 2024-01-28 VITALS — BP 112/56 | HR 76 | Resp 16 | Ht 59.0 in | Wt 103.2 lb

## 2024-01-28 DIAGNOSIS — E785 Hyperlipidemia, unspecified: Secondary | ICD-10-CM | POA: Diagnosis not present

## 2024-01-28 DIAGNOSIS — E1169 Type 2 diabetes mellitus with other specified complication: Secondary | ICD-10-CM

## 2024-01-28 DIAGNOSIS — E1159 Type 2 diabetes mellitus with other circulatory complications: Secondary | ICD-10-CM

## 2024-01-28 DIAGNOSIS — Z7189 Other specified counseling: Secondary | ICD-10-CM | POA: Diagnosis not present

## 2024-01-28 DIAGNOSIS — I152 Hypertension secondary to endocrine disorders: Secondary | ICD-10-CM

## 2024-01-28 DIAGNOSIS — I700 Atherosclerosis of aorta: Secondary | ICD-10-CM

## 2024-01-28 DIAGNOSIS — E781 Pure hyperglyceridemia: Secondary | ICD-10-CM

## 2024-01-28 MED ORDER — REPATHA SURECLICK 140 MG/ML ~~LOC~~ SOAJ: 140.0000 mg | SUBCUTANEOUS | 0 refills | Status: AC

## 2024-01-28 MED ORDER — REPATHA SURECLICK 140 MG/ML ~~LOC~~ SOAJ: 140.0000 mg | SUBCUTANEOUS | 3 refills | Status: AC

## 2024-01-28 NOTE — Patient Instructions (Signed)
 Medication Instructions:  Start Repatha  140 mg every 14 days (for example every other Saturday) *If you need a refill on your cardiac medications before your next appointment, please call your pharmacy*  Lab Work: In 3 months get fasting NMR If you have labs (blood work) drawn today and your tests are completely normal, you will receive your results only by: MyChart Message (if you have MyChart) OR A paper copy in the mail If you have any lab test that is abnormal or we need to change your treatment, we will call you to review the results.  Testing/Procedures: None Ordered  Follow-Up: At [facility], you and your health needs are our priority.  As part of our continuing mission to provide you with exceptional heart care, our providers are all part of one team.  This team includes your primary Cardiologist (physician) and Advanced Practice Providers or APPs (Physician Assistants and Nurse Practitioners) who all work together to provide you with the care you need, when you need it.  Your next appointment:   3 month(s)  Provider:   [name], NP    We recommend signing up for the patient portal called MyChart.  Sign up information is provided on this After Visit Summary.  MyChart is used to connect with patients for Virtual Visits (Telemedicine).  Patients are able to view lab/test results, encounter notes, upcoming appointments, etc.  Non-urgent messages can be sent to your provider as well.   To learn more about what you can do with MyChart, go to [email or web address].   Other Instructions Adopting a Healthy Lifestyle.   Weight: Know what a healthy weight is for you (roughly BMI <25) and aim to maintain this. You can calculate your body mass index on your smart phone. Unfortunately, this is not the most accurate measure of healthy weight, but it is the simplest measurement to use. A more accurate measurement involves body scanning which measures lean muscle, fat  tissue and bony density. We do not have this equipment at [facility].    Diet: Aim for 7+ servings of fruits and vegetables daily Limit animal fats in diet for cholesterol and heart health - choose grass fed whenever available Avoid highly processed foods (fast food burgers, tacos, fried chicken, pizza, hot dogs, french fries)  Saturated fat comes in the form of butter, lard, coconut oil, margarine, partially hydrogenated oils, and fat in meat. These increase your risk of cardiovascular disease.  Use healthy plant oils, such as olive, canola, soy, corn, sunflower and peanut.  Whole foods such as fruits, vegetables and whole grains have fiber  Men need > 38 grams of fiber per day Women need > 25 grams of fiber per day  Load up on vegetables and fruits - one-half of your plate: Aim for color and variety, and remember that potatoes dont count. Go for whole grains - one-quarter of your plate: Whole wheat, barley, wheat berries, quinoa, oats, brown rice, and foods made with them. If you want pasta, go with whole wheat pasta. Protein power - one-quarter of your plate: Fish, chicken, beans, and nuts are all healthy, versatile protein sources. Limit red meat. You need carbohydrates for energy! The type of carbohydrate is more important than the amount. Choose carbohydrates such as vegetables, fruits, whole grains, beans, and nuts in the place of white rice, white pasta, potatoes (baked or fried), macaroni and cheese, cakes, cookies, and donuts.  If youre thirsty, drink water. Coffee and tea are good in moderation, but skip sugary drinks  and limit milk and dairy products to one or two daily servings. Keep sugar intake at 6 teaspoons or 24 grams or LESS       Exercise: Aim for 150 min of moderate intensity exercise weekly for heart health, and weights twice weekly for bone health Stay active - any steps are better than no steps! Aim for 7-9 hours of sleep daily

## 2024-02-19 ENCOUNTER — Other Ambulatory Visit: Payer: Self-pay | Admitting: Emergency Medicine

## 2024-02-19 DIAGNOSIS — E1159 Type 2 diabetes mellitus with other circulatory complications: Secondary | ICD-10-CM

## 2024-04-02 ENCOUNTER — Emergency Department (HOSPITAL_COMMUNITY)

## 2024-04-02 ENCOUNTER — Emergency Department (HOSPITAL_COMMUNITY)
Admission: EM | Admit: 2024-04-02 | Discharge: 2024-04-02 | Disposition: A | Discharge status: Home / Self Care | Attending: Emergency Medicine | Admitting: Emergency Medicine

## 2024-04-02 ENCOUNTER — Encounter (HOSPITAL_COMMUNITY): Payer: Self-pay

## 2024-04-02 DIAGNOSIS — M79672 Pain in left foot: Secondary | ICD-10-CM | POA: Insufficient documentation

## 2024-04-02 DIAGNOSIS — Z7984 Long term (current) use of oral hypoglycemic drugs: Secondary | ICD-10-CM | POA: Insufficient documentation

## 2024-04-02 DIAGNOSIS — E119 Type 2 diabetes mellitus without complications: Secondary | ICD-10-CM | POA: Insufficient documentation

## 2024-04-02 DIAGNOSIS — M799 Soft tissue disorder, unspecified: Secondary | ICD-10-CM | POA: Diagnosis not present

## 2024-04-02 DIAGNOSIS — S91302A Unspecified open wound, left foot, initial encounter: Secondary | ICD-10-CM | POA: Diagnosis not present

## 2024-04-02 LAB — CBC WITH DIFFERENTIAL/PLATELET
Abs Immature Granulocytes: 0.06 K/uL (ref 0.00–0.07)
Basophils Absolute: 0 K/uL (ref 0.0–0.1)
Basophils Relative: 0 %
Eosinophils Absolute: 0.2 K/uL (ref 0.0–0.5)
Eosinophils Relative: 3 %
HCT: 37.2 % (ref 36.0–46.0)
Hemoglobin: 11.1 g/dL — ABNORMAL LOW (ref 12.0–15.0)
Immature Granulocytes: 1 %
Lymphocytes Relative: 32 %
Lymphs Abs: 2.8 K/uL (ref 0.7–4.0)
MCH: 22.2 pg — ABNORMAL LOW (ref 26.0–34.0)
MCHC: 29.8 g/dL — ABNORMAL LOW (ref 30.0–36.0)
MCV: 74.4 fL — ABNORMAL LOW (ref 80.0–100.0)
Monocytes Absolute: 0.6 K/uL (ref 0.1–1.0)
Monocytes Relative: 6 %
Neutro Abs: 5.1 K/uL (ref 1.7–7.7)
Neutrophils Relative %: 58 %
Platelets: 234 K/uL (ref 150–400)
RBC: 5 MIL/uL (ref 3.87–5.11)
RDW: 13.3 % (ref 11.5–15.5)
WBC: 8.8 K/uL (ref 4.0–10.5)
nRBC: 0 % (ref 0.0–0.2)

## 2024-04-02 LAB — COMPREHENSIVE METABOLIC PANEL WITH GFR
ALT: 10 U/L (ref 0–44)
AST: 23 U/L (ref 15–41)
Albumin: 4.1 g/dL (ref 3.5–5.0)
Alkaline Phosphatase: 58 U/L (ref 38–126)
Anion gap: 10 (ref 5–15)
BUN: 19 mg/dL (ref 8–23)
CO2: 24 mmol/L (ref 22–32)
Calcium: 9 mg/dL (ref 8.9–10.3)
Chloride: 106 mmol/L (ref 98–111)
Creatinine, Ser: 0.97 mg/dL (ref 0.44–1.00)
GFR, Estimated: >60 mL/min (ref >60)
Glucose, Bld: 172 mg/dL — ABNORMAL HIGH (ref 70–99)
Potassium: 4.1 mmol/L (ref 3.5–5.1)
Sodium: 141 mmol/L (ref 135–145)
Total Bilirubin: 0.4 mg/dL (ref 0.0–1.2)
Total Protein: 7.8 g/dL (ref 6.5–8.1)

## 2024-04-02 MED ORDER — DOXYCYCLINE HYCLATE 100 MG PO CAPS: 100.0000 mg | ORAL_CAPSULE | Freq: Two times a day (BID) | ORAL | 0 refills | Status: DC

## 2024-04-02 MED ORDER — HYDROCODONE-ACETAMINOPHEN 5-325 MG PO TABS: ORAL_TABLET | ORAL | 0 refills | Status: DC

## 2024-04-02 NOTE — ED Triage Notes (Signed)
 Pt presents with c/o foot pain to the left heel. Pt reports she is a diabetic and this wound has been bothering her for approx one month.

## 2024-04-02 NOTE — Discharge Instructions (Signed)
 Follow-up with your family doctor next week.  Also follow-up with the foot specialist Dr. Verta or one of his colleagues in the next 2 to 3 weeks

## 2024-04-02 NOTE — ED Provider Notes (Signed)
 [place] EMERGENCY DEPARTMENT AT [facility] Provider Note   CSN: [id number] Arrival date & time: 04/02/24  9050     Patient presents with: Foot Pain   Cathy Robbins is a 68 y.o. female.      Patient has a history of diabetes.  She complains of pain in her left heel.  The history is provided by the patient and medical records. No language interpreter was used.  Foot Pain This is a new problem. The current episode started more than 2 days ago. The problem occurs constantly. The problem has not changed since onset.Pertinent negatives include no chest pain, no abdominal pain and no headaches. The symptoms are aggravated by walking. Nothing relieves the symptoms. She has tried nothing for the symptoms. The treatment provided no relief.       Prior to Admission medications   Medication Sig Start Date End Date Taking? Authorizing Provider  doxycycline (VIBRAMYCIN) 100 MG capsule Take 1 capsule (100 mg total) by mouth 2 (two) times daily. One po bid x 7 days 04/02/24  Yes Monserat Prestigiacomo, MD  HYDROcodone-acetaminophen  (NORCO/VICODIN) 5-325 MG tablet Take 1 every 6 hours for pain that is not relieved by Tylenol  or Motrin  alone 04/02/24  Yes Wandalene Abrams, MD  Evolocumab  (REPATHA  SURECLICK) 140 MG/ML SOAJ Inject 140 mg into the skin every 14 (fourteen) days. 01/28/24   Swinyer, [name] HERO, NP  FARXIGA  10 MG TABS tablet TAKE 1 TABLET BY MOUTH DAILY BEFORE BREAKFAST. 04/02/23   Purcell Emil Schanz, MD  glipiZIDE  (GLUCOTROL ) 5 MG tablet TAKE 1 TABLET (5 MG TOTAL) BY MOUTH TWICE A DAY BEFORE MEALS 05/22/23   Purcell Emil Schanz, MD  lisinopril  (ZESTRIL ) 20 MG tablet TAKE 1 TABLET BY MOUTH EVERY DAY 02/19/24   Purcell Emil Schanz, MD  rosuvastatin  (CRESTOR ) 20 MG tablet Take 1 tablet (20 mg total) by mouth daily. 05/06/23   Hilty, Vinie BROCKS, MD    Allergies: Patient has no known allergies.    Review of Systems  Constitutional:  Negative for appetite change and fatigue.  HENT:  Negative  for congestion, ear discharge and sinus pressure.   Eyes:  Negative for discharge.  Respiratory:  Negative for cough.   Cardiovascular:  Negative for chest pain.  Gastrointestinal:  Negative for abdominal pain and diarrhea.  Genitourinary:  Negative for frequency and hematuria.  Musculoskeletal:  Negative for back pain.       Left heel pain  Skin:  Negative for rash.  Neurological:  Negative for seizures and headaches.  Psychiatric/Behavioral:  Negative for hallucinations.     Updated Vital Signs BP (!) 144/85 (BP Location: Left Arm)   Pulse 78   Temp 98.4 F (36.9 C) (Oral)   Resp 16   SpO2 99%   Physical Exam Vitals and nursing note reviewed.  Constitutional:      Appearance: She is well-developed.  HENT:     Head: Normocephalic.     Nose: Nose normal.  Eyes:     General: No scleral icterus.    Conjunctiva/sclera: Conjunctivae normal.  Neck:     Thyroid: No thyromegaly.  Cardiovascular:     Rate and Rhythm: Normal rate and regular rhythm.     Heart sounds: No murmur heard.    No friction rub. No gallop.  Pulmonary:     Breath sounds: No stridor. No wheezing or rales.  Chest:     Chest wall: No tenderness.  Abdominal:     General: There is no distension.  Tenderness: There is no abdominal tenderness. There is no rebound.  Musculoskeletal:        General: Normal range of motion.     Cervical back: Neck supple.     Comments: Tenderness to left heel  Lymphadenopathy:     Cervical: No cervical adenopathy.  Skin:    Findings: No erythema or rash.  Neurological:     Mental Status: She is alert and oriented to person, place, and time.     Motor: No abnormal muscle tone.     Coordination: Coordination normal.  Psychiatric:        Behavior: Behavior normal.     (all labs ordered are listed, but only abnormal results are displayed) Labs Reviewed  CBC WITH DIFFERENTIAL/PLATELET - Abnormal; Notable for the following components:      Result Value   Hemoglobin  11.1 (*)    MCV 74.4 (*)    MCH 22.2 (*)    MCHC 29.8 (*)    All other components within normal limits  COMPREHENSIVE METABOLIC PANEL WITH GFR - Abnormal; Notable for the following components:   Glucose, Bld 172 (*)    All other components within normal limits    EKG: None  Radiology: DG Foot Complete Left Result Date: 04/02/2024 CLINICAL DATA:  Left foot pain and wound of heel. History of diabetes. EXAM: LEFT FOOT - COMPLETE 3+ VIEW COMPARISON:  None Available. FINDINGS: There is no evidence of acute fracture or dislocation. No bony destruction two suggest osteomyelitis by x-ray. Soft tissue thickening along the plantar aspect of the hindfoot without evidence of soft tissue gas or foreign body. IMPRESSION: Soft tissue thickening along the plantar aspect of the hindfoot without evidence of soft tissue gas or foreign body. No radiographic evidence of osteomyelitis. Electronically Signed   By: Marcey Moan M.D.   On: 04/02/2024 10:45     Procedures   Medications Ordered in the ED - No data to display                                  Medical Decision Making Amount and/or Complexity of Data Reviewed Labs: ordered. Radiology: ordered.  Risk Prescription drug management.   Inflamed left heel.  Patient started on doxycycline and hydrocodone and referred to podiatry     Final diagnoses:  Foot pain, left    ED Discharge Orders          Ordered    doxycycline (VIBRAMYCIN) 100 MG capsule  2 times daily        04/02/24 1125    HYDROcodone-acetaminophen  (NORCO/VICODIN) 5-325 MG tablet        04/02/24 1125               Suzette Pac, MD 04/05/24 1627

## 2024-04-05 ENCOUNTER — Telehealth: Payer: Self-pay

## 2024-04-05 NOTE — Telephone Encounter (Signed)
 Copied from CRM [id number]. Topic: General - Other >> Apr 05, 2024  8:38 AM Henretta I wrote: Reason for CRM: Patients daughter Frederik called and would like to know if a disability form can be filled out for patient. Call back number is [phone].

## 2024-04-06 ENCOUNTER — Telehealth: Payer: Self-pay

## 2024-04-06 NOTE — Telephone Encounter (Signed)
 Copied from CRM [id number]. Topic: General - Other >> Apr 05, 2024  8:38 AM Henretta I wrote: Reason for CRM: Patients daughter Frederik called and would like to know if a disability form can be filled out for patient. Call back number is [phone]. >> Apr 06, 2024 10:09 AM Rosina BIRCH wrote: Patient daughter-San called stating she is checking on a previous message  [phone]

## 2024-04-07 NOTE — Telephone Encounter (Signed)
 Spoke with daughter and she states she will drop off form some time today. States needing it completed before next week

## 2024-04-09 NOTE — ED Notes (Signed)
 Opened chart to provide work note for husband.

## 2024-04-15 ENCOUNTER — Ambulatory Visit: Payer: Self-pay | Admitting: *Deleted

## 2024-04-15 NOTE — Telephone Encounter (Signed)
 FYI Only or Action Required?: FYI only for provider.  Patient was last seen in primary care on 12/22/2023 by Purcell Emil Schanz, MD.  Called Nurse Triage reporting Foot Pain.  Symptoms began several weeks ago.  Interventions attempted: Other: Patient was seen at ED for this complaint 10/3.  Symptoms are: gradually worsening.  Triage Disposition: See Physician Within 24 Hours  Patient/caregiver understands and will follow disposition?: yes  Reason for Disposition  [1] Swollen foot AND [2] no fever  (Exceptions: Localized bump from bunions, calluses, insect bite, sting.)  Answer Assessment - Initial Assessment Questions Patient daughter calling to schedule appointment- not with patient:   1. ONSET: When did the pain start?      ED-10/3- foot pain- treated with antibiotic and pain medication.  2. LOCATION: Where is the pain located?      Not sure- daughter calling to make appointment 3. PAIN: How bad is the pain?    (Scale 1-10; or mild, moderate, severe)     Using cane, limping 4. WORK OR EXERCISE: Has there been any recent work or exercise that involved this part of the body?      no 5. CAUSE: What do you think is causing the foot pain?     Wound has opened- bleeding 6. OTHER SYMPTOMS: Do you have any other symptoms? (e.g., leg pain, rash, fever, numbness)     unknown  Protocols used: Foot Pain-A-AH   Copied from CRM [id number]. Topic: Clinical - Red Word Triage >> Apr 15, 2024  8:02 AM Mia F wrote: Red Word that prompted transfer to Nurse Triage: Foot pain that is bleeding. Went to [facility] and was given something for treatment but still having pain. Has been going on for about a week or two. Has to use cane to walk. Pain is worsening

## 2024-04-15 NOTE — Progress Notes (Signed)
 Acute Office Visit  Subjective:     Patient ID: Cathy Robbins, female    DOB: June 17, 1956, 68 y.o.   MRN: [id number]  Chief Complaint  Patient presents with   Hospitalization Follow-up    Having worsening pain in foot, has been trying to elevate foot at home    HPI  Discussed the use of AI scribe software for clinical note transcription with the patient, who gave verbal consent to proceed.  History of Present Illness Cathy Robbins is a 68 year old female with diabetes who presents with a non-healing ulcer on her left leg. She is accompanied by a family member who assists with communication due to language barriers and hearing difficulties. [place] video interpreter used with this visit as well.  Left lower extremity ulcer - Non-healing ulcer on the left leg present for two months - Ulcer began as a small red spot and has progressively enlarged and opened - Persistent pain in the left leg associated with the ulcer - Pain disrupts sleep - Ulcer is associated with underlying diabetes - Pain persists despite interventions  Antibiotic therapy adherence - Prescribed doxycycline for the ulcer - Did not complete the full course of antibiotics; four days of medication remain - Initial improvement in pain with doxycycline, but symptoms recurred leading to premature discontinuation  Occupational and environmental factors - Works in a [employer] three days per week - Wears shoes during work shifts, which may impact the ulcer  Communication barriers - Requires assistance from a family member for communication due to language barriers and hearing difficulties     ROS Per HPI      Objective:    BP 110/70 (BP Location: Left Arm, Patient Position: Sitting)   Pulse 82   Temp 98 F (36.7 C) (Temporal)   Ht 4' 11 (1.499 m)   Wt 98 lb (44.5 kg)   SpO2 97%   BMI 19.79 kg/m    Physical Exam Vitals and nursing note reviewed.  Constitutional:      General: She is not in acute  distress.    Appearance: Normal appearance. She is normal weight.  HENT:     Head: Normocephalic and atraumatic.  Eyes:     Extraocular Movements: Extraocular movements intact.  Cardiovascular:     Rate and Rhythm: Normal rate and regular rhythm.     Pulses: Normal pulses.     Heart sounds: Normal heart sounds.  Pulmonary:     Effort: Pulmonary effort is normal. No respiratory distress.     Breath sounds: Normal breath sounds. No wheezing, rhonchi or rales.  Musculoskeletal:        General: Normal range of motion.     Cervical back: Normal range of motion.     Right lower leg: No edema.     Left lower leg: No edema.  Lymphadenopathy:     Cervical: No cervical adenopathy.  Skin:    Comments: See photo of Left heel. Ulcerative lesion, with surrounding erythema. Exquisitely tender. No discharge or bleeding  Neurological:     General: No focal deficit present.     Mental Status: She is alert and oriented to person, place, and time.  Psychiatric:        Mood and Affect: Mood normal.        Thought Content: Thought content normal.     No results found for any visits on 04/16/24.      Assessment & Plan:   Assessment and Plan Assessment & Plan Left  lower extremity chronic ulcer Chronic ulcer likely due to uncontrolled diabetes. Previous doxycycline course incomplete, necessitating switch to Bactrim. - Prescribe Bactrim (sulfamethoxazole and trimethoprim) one pill twice a day for ten days. - Instruct to complete full course of antibiotics even if symptoms improve. - Advise to clean wound with warm soapy water. - Recommend using larger Band-Aid to cover wound when wearing shoes. - Instruct to monitor for signs of worsening infection, such as increased redness, fever, upset stomach, or fatigue, and return to hospital if these occur. - Schedule follow-up appointment next Friday to reassess ulcer.  Type 2 diabetes with skin ulcer of heel Type 2 diabetes likely contributing to  ulcer development and poor healing.     No orders of the defined types were placed in this encounter.    Meds ordered this encounter  Medications   sulfamethoxazole-trimethoprim (BACTRIM DS) 800-160 MG tablet    Sig: Take 1 tablet by mouth 2 (two) times daily.    Dispense:  20 tablet    Refill:  0    Return for Friday to recheck foot.  Corean LITTIE Ku, FNP

## 2024-04-16 ENCOUNTER — Ambulatory Visit: Admitting: Family Medicine

## 2024-04-16 ENCOUNTER — Encounter: Payer: Self-pay | Admitting: Family Medicine

## 2024-04-16 VITALS — BP 110/70 | HR 82 | Temp 98.0°F | Ht 59.0 in | Wt 98.0 lb

## 2024-04-16 DIAGNOSIS — L97409 Non-pressure chronic ulcer of unspecified heel and midfoot with unspecified severity: Secondary | ICD-10-CM | POA: Diagnosis not present

## 2024-04-16 DIAGNOSIS — E11621 Type 2 diabetes mellitus with foot ulcer: Secondary | ICD-10-CM | POA: Diagnosis not present

## 2024-04-16 DIAGNOSIS — L03116 Cellulitis of left lower limb: Secondary | ICD-10-CM | POA: Insufficient documentation

## 2024-04-16 MED ORDER — SULFAMETHOXAZOLE-TRIMETHOPRIM 800-160 MG PO TABS: 1.0000 | ORAL_TABLET | Freq: Two times a day (BID) | ORAL | 0 refills | Status: DC

## 2024-04-16 NOTE — Patient Instructions (Addendum)
 I have sent in an antibiotic for you to take 1 tablet by mouth twice a day for 10 days.  Please eat with this medication, it can upset your stomach if you do not.  Take all of the medication even if you are feeling better.   Follow up with me on Friday to recheck your leg, sooner if needed

## 2024-04-19 ENCOUNTER — Telehealth: Payer: Self-pay | Admitting: Pharmacy Technician

## 2024-04-19 ENCOUNTER — Other Ambulatory Visit (HOSPITAL_COMMUNITY): Payer: Self-pay

## 2024-04-19 NOTE — Telephone Encounter (Signed)
 Pharmacy Patient Advocate Encounter  Received notification from [employer] that Prior Authorization for Repatha  has been APPROVED from 04/19/24 to 06/30/25. Ran test claim, Copay is $141.00- 3 months. This test claim was processed through [facility]- copay amounts may vary at other pharmacies due to pharmacy/plan contracts, or as the patient moves through the different stages of their insurance plan.   PA #/Case ID/Reference #: [id number]

## 2024-04-19 NOTE — Telephone Encounter (Signed)
 Pharmacy Patient Advocate Encounter   Received notification from CoverMyMeds that prior authorization for Repatha  is required/requested.   Insurance verification completed.   The patient is insured through [facility].   Per test claim: PA required; PA submitted to above mentioned insurance via Latent Key/confirmation #/EOC [facility] Status is pending

## 2024-04-20 ENCOUNTER — Encounter: Payer: Self-pay | Admitting: *Deleted

## 2024-04-22 NOTE — Progress Notes (Signed)
 Acute Office Visit  Subjective:     Patient ID: Cathy Robbins, female    DOB: [birth year], 68 y.o.   MRN: [id number]  No chief complaint on file.   HPI  Discussed the use of AI scribe software for clinical note transcription with the patient, who gave verbal consent to proceed.  History of Present Illness Cathy Robbins is a 68 year old female with type 2 diabetes who presents with persistent leg pain and swelling.  Vietnamese video interpreter Amy, interpreter [phone] used for this visit  Lower extremity pain and swelling - Persistent throbbing pain and swelling localized to the leg for over two months - Pain severe enough to disturb sleep, beginning around 4 PM, intensifying by 8 PM, and lasting until 5 AM - Ambulation exacerbates pain, making walking difficult - Currently taking antibiotics for this condition with five doses remaining     ROS Per HPI      Objective:    BP (!) 116/58 (BP Location: Left Arm, Patient Position: Sitting)   Pulse 80   Temp 97.7 F (36.5 C) (Temporal)   Ht 4' 11 (1.499 m)   Wt 98 lb (44.5 kg)   SpO2 97%   BMI 19.79 kg/m    Physical Exam Vitals and nursing note reviewed.  Constitutional:      General: She is not in acute distress.    Appearance: Normal appearance. She is normal weight.  HENT:     Head: Normocephalic and atraumatic.     Right Ear: External ear normal.     Left Ear: External ear normal.     Nose: Nose normal.     Mouth/Throat:     Mouth: Mucous membranes are moist.     Pharynx: Oropharynx is clear.  Eyes:     Extraocular Movements: Extraocular movements intact.     Pupils: Pupils are equal, round, and reactive to light.  Cardiovascular:     Rate and Rhythm: Normal rate and regular rhythm.     Pulses: Normal pulses.     Heart sounds: Normal heart sounds.  Pulmonary:     Effort: Pulmonary effort is normal. No respiratory distress.     Breath sounds: Normal breath sounds. No wheezing, rhonchi or rales.  Musculoskeletal:         General: Normal range of motion.     Cervical back: Normal range of motion.     Right lower leg: No edema.     Left lower leg: No edema.  Lymphadenopathy:     Cervical: No cervical adenopathy.  Neurological:     General: No focal deficit present.     Mental Status: She is alert and oriented to person, place, and time.  Psychiatric:        Mood and Affect: Mood normal.        Thought Content: Thought content normal.     No results found for any visits on 04/23/24.      Assessment & Plan:   Assessment and Plan Assessment & Plan Type 2 diabetes mellitus with left lower extremity chronic ulcer and cellulitis Chronic ulcer with cellulitis showing improvement but with concerning discoloration and pain, possibly due to tendon involvement. - Order x-ray of left lower extremity to assess ulcer and cellulitis extent. - Provide leg stretches to alleviate pain. - Explore cushioned bandage options; provide picture for online ordering if unavailable. - Continue to complete the antibiotics - Tylenol  as needed for pain - Work note provided to be out for a  week     No orders of the defined types were placed in this encounter.    No orders of the defined types were placed in this encounter.   Return in about 1 week (around 04/30/2024).  Corean LITTIE Ku, FNP

## 2024-04-23 ENCOUNTER — Ambulatory Visit: Admitting: Family Medicine

## 2024-04-23 ENCOUNTER — Encounter: Payer: Self-pay | Admitting: Family Medicine

## 2024-04-23 VITALS — BP 116/58 | HR 80 | Temp 97.7°F | Ht 59.0 in | Wt 98.0 lb

## 2024-04-23 DIAGNOSIS — E11621 Type 2 diabetes mellitus with foot ulcer: Secondary | ICD-10-CM

## 2024-04-23 DIAGNOSIS — L03116 Cellulitis of left lower limb: Secondary | ICD-10-CM | POA: Diagnosis not present

## 2024-04-23 DIAGNOSIS — L97409 Non-pressure chronic ulcer of unspecified heel and midfoot with unspecified severity: Secondary | ICD-10-CM | POA: Diagnosis not present

## 2024-04-23 NOTE — Patient Instructions (Signed)
 May try hydrocolloid dressings from [facility] and cushions at night while sleeping.   May take tylenol  as needed for pain.   Follow up with me in a week for re check

## 2024-04-25 NOTE — Progress Notes (Unsigned)
 Cardiology Office Note   Date:  04/28/2024  ID:  Cathy Robbins, Cathy Robbins 07-23-1955, MRN [id number] PCP: Cathy Emil Schanz, MD  [facility] Providers Cardiologist:  None     PMH Dyslipidemia Type 2 diabetes mellitus Aortoiliac atherosclerosis on CT Hypertension Elevated LP(a)  Referred to Advanced lipid disorder clinic and seen by Dr. Mona 05/06/2023.  Pleasant Vietnamese speaking female who was accompanied by professional translator and her daughter who is English speaking.  She was referred for longtime history of high triglycerides.  Also has a history of type 2 diabetes with recent improvement in hemoglobin A1c.  Previously advised to start Crestor  10 mg daily, however she did not start the medication.  Recent lipid testing revealed total cholesterol 249, triglycerides 661, HDL 34, and LDL not calculated due to high triglycerides. Generally eats a traditional Vietnamese diet.  Very physically active at her job.  Prior CT of abdomen demonstrated aortoiliac atherosclerosis.  She was advised to start rosuvastatin  20 mg daily and limit carbohydrates and saturated fat.  Follow-up lipid panel 08/2023 revealed triglycerides still elevated at 689, total cholesterol 263, and LDL 76.  LP(a) elevated at 142.8 nmol/L.  She was advised to add Repatha  140 mg every 14 days in addition to rosuvastatin  20 mg daily.  Seen by me on 01/28/24 for follow-up of dyslipidemia and accompanied by her daughter and language Cathy Robbins # [id number]. She was prescribed Repatha  but never got it from pharmacy. There is a note in her chart that she received the health well grant which will fully cover the cost of Repatha .  We reviewed injection techniques and she feels comfortable with proceeding with starting Repatha . She remained active and reported feeling well. No chest pain, shortness of breath, lower extremity edema, fatigue, palpitations, weakness, presyncope, syncope, orthopnea, or PND. Advised to start Repatha  140  mg every 14 days and return for 3 month lipid testing with ov following.   History of Present Illness Discussed the use of AI scribe software for clinical note transcription with the patient, who gave verbal consent to proceed.  History of Present Illness She is a very pleasant 68 year old female who presents for a follow-up regarding her cholesterol management. She is accompanied by her daughter and we are assisted by  Cathy Robbins, from  [employer] [phone]. She reports left leg pain secondary to ulcer on left heel. She experiences severe leg pain and a cold left foot. She is completing a course of antibiotics today and is scheduled to see her PCP in 2 days. She is on Repatha , which she is tolerating well.  She is unsure if she is taking rosuvastatin , but her daughter is going to check at home. Her diet consists of vegetables, rice, and occasionally more sugar than recommended. She tries to consume brown rice and enjoys vegetables and dark chocolate. Her blood sugar tends to run high with A1c of 7.6% on 06/11/2023. She denies chest pain, shortness of breath, lower extremity edema, fatigue, palpitations, weakness, presyncope, syncope, orthopnea, and PND.    ROS: See HPI  Studies Reviewed      Lipoprotein (a)  Date/Time Value Ref Range Status  09/19/2023 10:21 AM 142.8 (H) <75.0 nmol/L Final    Comment:    Note:  Values greater than or equal to 75.0 nmol/L may        indicate an independent risk factor for CHD,        but must be evaluated with caution when applied  to non-Caucasian populations due to the        influence of genetic factors on Lp(a) across        ethnicities.     Risk Assessment/Calculations           Physical Exam VS:  BP 132/64 (BP Location: Right Arm, Patient Position: Sitting, Cuff Size: Small)   Pulse 69   Ht 5' 2 (1.575 m)   Wt 106 lb (48.1 kg)   SpO2 99%   BMI 19.39 kg/m    Wt Readings from Last 3 Encounters:  04/28/24  106 lb (48.1 kg)  04/23/24 98 lb (44.5 kg)  04/16/24 98 lb (44.5 kg)    GEN: Well nourished, well developed in no acute distress NECK: No JVD; No carotid bruits CARDIAC: RRR, no murmurs, rubs, gallops RESPIRATORY:  Clear to auscultation without rales, wheezing or rhonchi  ABDOMEN: Soft, non-tender, non-distended EXTREMITIES:  No edema; Bandage to left heel ulcer, not removed. 2+ pedal pulses bilaterally. Left foot cold to touch   Assessment & Plan Hyperlipidemia LDL goal < 70 Elevated LP(a) History of dyslipidemia with high triglycerides. LP(a) elevated at 142.8. LDL remained above goal of < 70 on Crestor  alone and she was advised to start Repatha . Reports she is tolerating Repatha  140 mg every 14 days without concerning side effects. Her daughter will verify at home that she has continued rosuvastatin  20 mg daily as well.  No concerns with her medications.  Need lipid panel for surveillance.  - Recheck lipid panel when she can return fasting - Continue Repatha  140 mg every 14 days - Continue rosuvastatin  20 mg daily  Hypertension BP is well controlled. Renal function stable on labs completed 04/02/24.  -Continue lisinopril   Type 2 diabetes Foot ulcer/Cold foot A1c of 7.6% on 06/11/2023.  Her daughter admits that blood sugar has not been well-controlled and that patient sometimes hides sugary foods, but otherwise eats a healthy diet with lots of vegetables.  She has an ulcer on her left heel treated in ED and concerning for diabetic ulcer.  Abdomen CT 08/2022 revealed estimated 50% stenosis within right common iliac artery just proximal to bifurcation, prominent atherosclerosis of bilateral iliac arteries.  Left foot is colder to touch than right.  She has 2+ pulses in both feet. - Recommend ABI for evaluation of PAD - Keep appointment with PCP in 2 days for management of diabetes  Cardiac risk assessment We reviewed ASCVD risk factors including elevated LP(a), history of diabetes, and  hyperlipidemia. She denies chest pain, dyspnea, or other symptoms concerning for angina.  No indication for further ischemic evaluation at this time. - Continue heart healthy diet limiting sodium, saturated fat, sugar, simple carbohydrates, and processed food -Remain physically active and aim to achieve at least 150 minutes of moderate intensity exercise each week         Dispo: TBD following lipid testing  Signed, [name], NP-C

## 2024-04-26 ENCOUNTER — Encounter (HOSPITAL_BASED_OUTPATIENT_CLINIC_OR_DEPARTMENT_OTHER): Payer: Self-pay

## 2024-04-27 ENCOUNTER — Encounter (HOSPITAL_BASED_OUTPATIENT_CLINIC_OR_DEPARTMENT_OTHER): Payer: Self-pay

## 2024-04-27 ENCOUNTER — Emergency Department (HOSPITAL_COMMUNITY)

## 2024-04-27 ENCOUNTER — Emergency Department (HOSPITAL_COMMUNITY)
Admission: EM | Admit: 2024-04-27 | Discharge: 2024-04-27 | Disposition: A | Discharge status: Home / Self Care | Attending: Emergency Medicine | Admitting: Emergency Medicine

## 2024-04-27 ENCOUNTER — Encounter (HOSPITAL_COMMUNITY): Payer: Self-pay | Admitting: Emergency Medicine

## 2024-04-27 DIAGNOSIS — Z7984 Long term (current) use of oral hypoglycemic drugs: Secondary | ICD-10-CM | POA: Insufficient documentation

## 2024-04-27 DIAGNOSIS — L97429 Non-pressure chronic ulcer of left heel and midfoot with unspecified severity: Secondary | ICD-10-CM | POA: Diagnosis present

## 2024-04-27 DIAGNOSIS — E11622 Type 2 diabetes mellitus with other skin ulcer: Secondary | ICD-10-CM | POA: Diagnosis not present

## 2024-04-27 DIAGNOSIS — I10 Essential (primary) hypertension: Secondary | ICD-10-CM | POA: Insufficient documentation

## 2024-04-27 DIAGNOSIS — Z79899 Other long term (current) drug therapy: Secondary | ICD-10-CM | POA: Insufficient documentation

## 2024-04-27 MED ORDER — KETOROLAC TROMETHAMINE 60 MG/2ML IM SOLN
30.0000 mg | Freq: Once | INTRAMUSCULAR | Status: AC
  Administered 2024-04-27: 30 mg via INTRAMUSCULAR
  Filled 2024-04-27: qty 2

## 2024-04-27 MED ORDER — OXYCODONE HCL 5 MG PO TABS: 5.0000 mg | ORAL_TABLET | Freq: Four times a day (QID) | ORAL | 0 refills | Status: DC | PRN

## 2024-04-27 MED ORDER — OXYCODONE-ACETAMINOPHEN 5-325 MG PO TABS
1.0000 | ORAL_TABLET | Freq: Once | ORAL | Status: AC
  Administered 2024-04-27: 1 via ORAL
  Filled 2024-04-27: qty 1

## 2024-04-27 MED ORDER — BACITRACIN ZINC 500 UNIT/GM EX OINT
TOPICAL_OINTMENT | Freq: Two times a day (BID) | CUTANEOUS | Status: DC
  Administered 2024-04-27: 1 via TOPICAL
  Filled 2024-04-27: qty 0.9

## 2024-04-27 NOTE — ED Provider Notes (Signed)
 [place] EMERGENCY DEPARTMENT AT [facility] Provider Note   CSN: [id number] Arrival date & time: 04/27/24  2107     Patient presents with: Foot Pain   Cathy Robbins is a 68 y.o. female.   Patient here with ongoing pain to her left heel.  She has a small ulcer there.  She finished a round of antibiotics with great improvement but she still having pain.  She has been taking Tylenol  and ibuprofen  with minimal help.  She has a history of diabetes hypertension .  Denies any weakness numbness tingling.  Denies any nausea vomiting diarrhea.  The history is provided by the patient.       Prior to Admission medications   Medication Sig Start Date End Date Taking? Authorizing Provider  oxyCODONE  (ROXICODONE ) 5 MG immediate release tablet Take 1 tablet (5 mg total) by mouth every 6 (six) hours as needed for breakthrough pain. 04/27/24  Yes Kehlani Vancamp, DO  Evolocumab  (REPATHA  SURECLICK) 140 MG/ML SOAJ Inject 140 mg into the skin every 14 (fourteen) days. 01/28/24   Swinyer, [name] HERO, NP  FARXIGA  10 MG TABS tablet TAKE 1 TABLET BY MOUTH DAILY BEFORE BREAKFAST. 04/02/23   Purcell Emil Schanz, MD  glipiZIDE  (GLUCOTROL ) 5 MG tablet TAKE 1 TABLET (5 MG TOTAL) BY MOUTH TWICE A DAY BEFORE MEALS 05/22/23   Purcell Emil Schanz, MD  HYDROcodone-acetaminophen  (NORCO/VICODIN) 5-325 MG tablet Take 1 every 6 hours for pain that is not relieved by Tylenol  or Motrin  alone 04/02/24   Zammit, Joseph, MD  lisinopril  (ZESTRIL ) 20 MG tablet TAKE 1 TABLET BY MOUTH EVERY DAY 02/19/24   Purcell Emil Schanz, MD  rosuvastatin  (CRESTOR ) 20 MG tablet Take 1 tablet (20 mg total) by mouth daily. 05/06/23   Hilty, Vinie BROCKS, MD  sulfamethoxazole-trimethoprim (BACTRIM DS) 800-160 MG tablet Take 1 tablet by mouth 2 (two) times daily. 04/16/24   Alvia Corean CROME, FNP    Allergies: Patient has no known allergies.    Review of Systems  Updated Vital Signs BP 136/65 (BP Location: Left Arm)   Pulse 78    Temp 98.5 F (36.9 C) (Oral)   Resp 18   SpO2 100%   Physical Exam Vitals and nursing note reviewed.  Constitutional:      General: She is not in acute distress.    Appearance: She is well-developed. She is not ill-appearing.  HENT:     Head: Normocephalic and atraumatic.     Nose: Nose normal.     Mouth/Throat:     Mouth: Mucous membranes are moist.  Eyes:     Extraocular Movements: Extraocular movements intact.     Conjunctiva/sclera: Conjunctivae normal.     Pupils: Pupils are equal, round, and reactive to light.  Cardiovascular:     Rate and Rhythm: Normal rate and regular rhythm.     Pulses: Normal pulses.     Heart sounds: Normal heart sounds. No murmur heard. Pulmonary:     Effort: Pulmonary effort is normal. No respiratory distress.     Breath sounds: Normal breath sounds.  Abdominal:     General: Abdomen is flat.     Palpations: Abdomen is soft.     Tenderness: There is no abdominal tenderness.  Musculoskeletal:        General: Tenderness present. No swelling.     Cervical back: Normal range of motion and neck supple.     Comments: Tenderness to the left heel  Skin:    General: Skin is warm and  dry.     Capillary Refill: Capillary refill takes less than 2 seconds.  Neurological:     General: No focal deficit present.     Mental Status: She is alert and oriented to person, place, and time.     Motor: No weakness.     Coordination: Coordination normal.  Psychiatric:        Mood and Affect: Mood normal.     (all labs ordered are listed, but only abnormal results are displayed) Labs Reviewed - No data to display  EKG: None  Radiology: DG Foot Complete Left Result Date: 04/27/2024 CLINICAL DATA:  Heel ulcer EXAM: LEFT FOOT - COMPLETE 3+ VIEW COMPARISON:  04/02/2024 FINDINGS: Bones appear osteopenic. No fracture or malalignment. No periostitis or osseous destructive change. Soft tissue thickening of the heel without soft tissue gas. IMPRESSION: No acute  osseous abnormality. Soft tissue thickening of the heel without soft tissue gas. Electronically Signed   By: Luke Bun M.D.   On: 04/27/2024 22:22     Procedures   Medications Ordered in the ED  bacitracin ointment (1 Application Topical Given 04/27/24 2226)  oxyCODONE -acetaminophen  (PERCOCET/ROXICET) 5-325 MG per tablet 1 tablet (has no administration in time range)  ketorolac (TORADOL) injection 30 mg (has no administration in time range)                                    Medical Decision Making Amount and/or Complexity of Data Reviewed Radiology: ordered.  Risk OTC drugs. Prescription drug management.   Cathy Robbins is here with pain in her left heel.  She has a well-appearing ulcer in her left heel.  There is no signs of redness or purulence.  X-rays show no evidence of osteomyelitis.  She has got good pulses on exam.  There is no edema or swelling.  Ultimately I think she is just having pain from her ulcer site.  Continue Tylenol  at home.  Will give her a dose of Toradol and oxycodone  here.  Does not seem like they have been covering this wounds.  Daughter bought some specialized wound bandages for this that I think will work great.  In the meantime I recommended that she do something similar with gauze.  Recommend bacitracin Neosporin ointment twice daily as well.  Will write her for oxycodone  for breakthrough pain for couple tablets but recommend continue supportive care with Tylenol  and make sure that this is well-padded and should help with her discomfort.  Patient can follow-up with primary care doctor for further pain management.  Family member translated for me.  Patient preference.  This chart was dictated using voice recognition software.  Despite best efforts to proofread,  errors can occur which can change the documentation meaning.      Final diagnoses:  Ulcer of left heel, unspecified ulcer stage [facility])    ED Discharge Orders          Ordered    oxyCODONE   (ROXICODONE ) 5 MG immediate release tablet  Every 6 hours PRN        04/27/24 2236               Ruthe Cornet, DO 04/27/24 2236

## 2024-04-27 NOTE — Discharge Instructions (Signed)
 Continue to pad this area well as we discussed.  Recommend bacitracin or Neosporin ointment twice daily for the next 5 to 7 days.  Follow-up with your primary care doctor for further pain management.  Continue 1000 mg of Tylenol  every 6 hours as needed for pain.  I have prescribed a narcotic pain medicine called oxycodone  for breakthrough pain.  This medication is sedating so please be careful with its use.  Recommend that primary care doctor manage chronic pain.

## 2024-04-27 NOTE — ED Triage Notes (Signed)
 Pt has dime size ulcer on left heel. Looks to be healing. No signs of infection or redness or warmth. Pt has finished a round of antibiotics prescribed by her PCP but states pain has been difficult to control today. Usually ok with tylenol .

## 2024-04-28 ENCOUNTER — Ambulatory Visit (HOSPITAL_BASED_OUTPATIENT_CLINIC_OR_DEPARTMENT_OTHER): Admitting: Nurse Practitioner

## 2024-04-28 ENCOUNTER — Encounter (HOSPITAL_BASED_OUTPATIENT_CLINIC_OR_DEPARTMENT_OTHER): Payer: Self-pay | Admitting: Nurse Practitioner

## 2024-04-28 VITALS — BP 132/64 | HR 69 | Ht 62.0 in | Wt 106.0 lb

## 2024-04-28 DIAGNOSIS — I152 Hypertension secondary to endocrine disorders: Secondary | ICD-10-CM

## 2024-04-28 DIAGNOSIS — R209 Unspecified disturbances of skin sensation: Secondary | ICD-10-CM

## 2024-04-28 DIAGNOSIS — E1159 Type 2 diabetes mellitus with other circulatory complications: Secondary | ICD-10-CM

## 2024-04-28 DIAGNOSIS — I700 Atherosclerosis of aorta: Secondary | ICD-10-CM | POA: Diagnosis not present

## 2024-04-28 DIAGNOSIS — E1165 Type 2 diabetes mellitus with hyperglycemia: Secondary | ICD-10-CM | POA: Diagnosis not present

## 2024-04-28 DIAGNOSIS — I708 Atherosclerosis of other arteries: Secondary | ICD-10-CM | POA: Diagnosis not present

## 2024-04-28 DIAGNOSIS — E7841 Elevated Lipoprotein(a): Secondary | ICD-10-CM

## 2024-04-28 DIAGNOSIS — E785 Hyperlipidemia, unspecified: Secondary | ICD-10-CM

## 2024-04-28 DIAGNOSIS — Z7189 Other specified counseling: Secondary | ICD-10-CM | POA: Diagnosis not present

## 2024-04-28 NOTE — Patient Instructions (Addendum)
 Medication Instructions:  Your physician recommends that you continue on your current medications as directed. Please refer to the Current Medication list given to you today.  **PLEASE Call to let us  know if she is not taking rosuvastatin  *If you need a refill on your cardiac medications before your next appointment, please call your pharmacy*  Lab Work: Fasting cholesterol panel needed ASAP at any [facility]  If you have labs (blood work) drawn today and your tests are completely normal, you will receive your results only by: MyChart Message (if you have MyChart) OR A paper copy in the mail If you have any lab test that is abnormal or we need to change your treatment, we will call you to review the results.  Testing/Procedures: Your physician has requested that you have an ankle brachial index (ABI). During this test an ultrasound and blood pressure cuff are used to evaluate the arteries that supply the arms and legs with blood. Allow thirty minutes for this exam. There are no restrictions or special instructions.  Please note: We ask at that you not bring children with you during ultrasound (echo/ vascular) testing. Due to room size and safety concerns, children are not allowed in the ultrasound rooms during exams. Our front office staff cannot provide observation of children in our lobby area while testing is being conducted. An adult accompanying a patient to their appointment will only be allowed in the ultrasound room at the discretion of the ultrasound technician under special circumstances. We apologize for any inconvenience.  Follow-Up: At [facility], you and your health needs are our priority.  As part of our continuing mission to provide you with exceptional heart care, our providers are all part of one team.  This team includes your primary Cardiologist (physician) and Advanced Practice Providers or APPs (Physician Assistants and Nurse Practitioners) who all work together to  provide you with the care you need, when you need it.  Your next appointment:    Will be based on your labs and vascular study   Provider:   LOIS Chad Hilty, MD or [name], NP    We recommend signing up for the patient portal called MyChart.  Sign up information is provided on this After Visit Summary.  MyChart is used to connect with patients for Virtual Visits (Telemedicine).  Patients are able to view lab/test results, encounter notes, upcoming appointments, etc.  Non-urgent messages can be sent to your provider as well.   To learn more about what you can do with MyChart, go to [email or web address].

## 2024-04-29 ENCOUNTER — Telehealth: Payer: Self-pay | Admitting: Nurse Practitioner

## 2024-04-29 NOTE — Telephone Encounter (Signed)
 Pt daughter calling to make office aware pt is still taking rosuvastatin . Please advise.

## 2024-04-30 ENCOUNTER — Encounter: Payer: Self-pay | Admitting: Family Medicine

## 2024-04-30 ENCOUNTER — Ambulatory Visit: Admitting: Family Medicine

## 2024-04-30 ENCOUNTER — Ambulatory Visit (INDEPENDENT_AMBULATORY_CARE_PROVIDER_SITE_OTHER): Admitting: Family Medicine

## 2024-04-30 VITALS — BP 160/80 | HR 79 | Temp 98.0°F | Ht 62.0 in | Wt 101.4 lb

## 2024-04-30 DIAGNOSIS — L97921 Non-pressure chronic ulcer of unspecified part of left lower leg limited to breakdown of skin: Secondary | ICD-10-CM

## 2024-04-30 DIAGNOSIS — M79672 Pain in left foot: Secondary | ICD-10-CM | POA: Diagnosis not present

## 2024-04-30 MED ORDER — ACETAMINOPHEN-CODEINE 300-30 MG PO TABS: 1.0000 | ORAL_TABLET | Freq: Every evening | ORAL | 0 refills | Status: AC | PRN

## 2024-04-30 NOTE — Telephone Encounter (Signed)
 No changes needed to medication list.

## 2024-04-30 NOTE — Telephone Encounter (Signed)
 Yes, I asked her to confirm because when I said the name of the medication she wasn't certain. Thank you!

## 2024-04-30 NOTE — Patient Instructions (Signed)
 I have sent in Tylenol  3 for you to take just at bedtime.  May take Advil  or Tylenol  during the day, do not take Tylenol  with the prescription Tylenol  3.  May get gel cup inserts to put in the heels of your shoes to help prevent this from happening again.  Follow-up with me for new or worsening symptoms.

## 2024-04-30 NOTE — Progress Notes (Signed)
 Acute Office Visit  Subjective:     Patient ID: Cathy Robbins, female    DOB: 25-Sep-1955, 68 y.o.   MRN: [id number]  Chief Complaint  Patient presents with   Follow-up    HPI  Discussed the use of AI scribe software for clinical note transcription with the patient, who gave verbal consent to proceed.  History of Present Illness Cathy Robbins is a 68 year old female with type 2 diabetes who presents with a healing foot ulcer and pain management concerns.  In person [place] Vietnamese interpreter present.  Diabetic foot ulcer - Healing ulcer located on the foot - Improvement observed over the past four days - Elevates calf on a pillow when resting  Foot pain - Pain managed with 500 mg acetaminophen  every 6 to 8 hours - Pain previously managed with acetaminophen  with codeine, which was effective but caused dizziness - Pain worsens at night, especially when resting - Current pain regimen effectively controls symptoms  Occupational concerns related to foot health - Works in a [employer] - Concerned about returning to work due to requirement to wear anti-slip shoes, which may aggravate foot condition - Has worn anti-slip shoes for many years, receiving a new pair annually as part of uniform     ROS Per HPI      Objective:    BP (!) 160/80 (BP Location: Left Arm, Patient Position: Sitting)   Pulse 79   Temp 98 F (36.7 C) (Temporal)   Ht 5' 2 (1.575 m)   Wt 101 lb 6.4 oz (46 kg)   SpO2 99%   BMI 18.55 kg/m    Physical Exam Vitals and nursing note reviewed.  Constitutional:      General: She is not in acute distress.    Appearance: Normal appearance. She is normal weight.  HENT:     Head: Normocephalic and atraumatic.     Right Ear: External ear normal.     Left Ear: External ear normal.     Nose: Nose normal.     Mouth/Throat:     Mouth: Mucous membranes are moist.     Pharynx: Oropharynx is clear.  Eyes:     Extraocular Movements: Extraocular  movements intact.     Pupils: Pupils are equal, round, and reactive to light.  Cardiovascular:     Rate and Rhythm: Normal rate and regular rhythm.     Pulses: Normal pulses.  Pulmonary:     Effort: Pulmonary effort is normal.     Breath sounds: Normal breath sounds.  Musculoskeletal:        General: Normal range of motion.     Cervical back: Normal range of motion.     Right lower leg: No edema.     Left lower leg: No edema.  Lymphadenopathy:     Cervical: No cervical adenopathy.  Skin:    Comments: Opening of ulcer to the left heel is now smaller than the end of a pencil eraser, no bleeding, no discharge, no erythema, nontender today  Neurological:     General: No focal deficit present.     Mental Status: She is alert and oriented to person, place, and time.  Psychiatric:        Mood and Affect: Mood normal.        Thought Content: Thought content normal.     No results found for any visits on 04/30/24.      Assessment & Plan:   Assessment and Plan Assessment & Plan  Chronic ulcer of left lower extremity, left heel pain Left heel ulcer improving with reduced redness and pain. Pain management remains a concern, especially at night. - Continue bandaging regimen and calf elevation. - Prescribe Tylenol  with codeine for nighttime pain. - Advise plain Advil  or Tylenol  500 mg every 6-8 hours during the day. - Continue with gel dressings until the area is healed - Consider insoles for work shoes.     No orders of the defined types were placed in this encounter.    Meds ordered this encounter  Medications   acetaminophen -codeine (TYLENOL  #3) 300-30 MG tablet    Sig: Take 1 tablet by mouth at bedtime as needed for moderate pain (pain score 4-6).    Dispense:  15 tablet    Refill:  0    Return if symptoms worsen or fail to improve.  Corean LITTIE Ku, FNP

## 2024-05-18 ENCOUNTER — Other Ambulatory Visit (HOSPITAL_BASED_OUTPATIENT_CLINIC_OR_DEPARTMENT_OTHER): Payer: Self-pay | Admitting: *Deleted

## 2024-05-18 ENCOUNTER — Ambulatory Visit (INDEPENDENT_AMBULATORY_CARE_PROVIDER_SITE_OTHER)

## 2024-05-18 ENCOUNTER — Encounter (HOSPITAL_BASED_OUTPATIENT_CLINIC_OR_DEPARTMENT_OTHER): Payer: Self-pay | Admitting: Nurse Practitioner

## 2024-05-18 DIAGNOSIS — I708 Atherosclerosis of other arteries: Secondary | ICD-10-CM

## 2024-05-18 DIAGNOSIS — R6889 Other general symptoms and signs: Secondary | ICD-10-CM

## 2024-05-18 DIAGNOSIS — R209 Unspecified disturbances of skin sensation: Secondary | ICD-10-CM

## 2024-05-18 DIAGNOSIS — I700 Atherosclerosis of aorta: Secondary | ICD-10-CM

## 2024-05-18 LAB — VAS US ABI WITH/WO TBI
Left ABI: 0.47
Right ABI: 0.73

## 2024-05-19 ENCOUNTER — Telehealth (HOSPITAL_BASED_OUTPATIENT_CLINIC_OR_DEPARTMENT_OTHER): Payer: Self-pay | Admitting: Nurse Practitioner

## 2024-05-19 ENCOUNTER — Telehealth: Payer: Self-pay | Admitting: Pharmacy Technician

## 2024-05-19 ENCOUNTER — Ambulatory Visit (HOSPITAL_BASED_OUTPATIENT_CLINIC_OR_DEPARTMENT_OTHER): Payer: Self-pay | Admitting: Nurse Practitioner

## 2024-05-19 ENCOUNTER — Encounter (HOSPITAL_BASED_OUTPATIENT_CLINIC_OR_DEPARTMENT_OTHER): Payer: Self-pay

## 2024-05-19 ENCOUNTER — Other Ambulatory Visit (HOSPITAL_COMMUNITY): Payer: Self-pay

## 2024-05-19 LAB — NMR, LIPOPROFILE
Cholesterol, Total: 122 mg/dL (ref 100–199)
HDL Particle Number: 33 umol/L (ref >=30.5)
HDL-C: 35 mg/dL — ABNORMAL LOW (ref >39)
LDL Particle Number: <300 nmol/L (ref <1000)
LDL-C (NIH Calc): 42 mg/dL (ref 0–99)
LP-IR Score: 57 — ABNORMAL HIGH (ref <=45)
Small LDL Particle Number: <90 nmol/L (ref <=527)
Triglycerides: 296 mg/dL — ABNORMAL HIGH (ref 0–149)

## 2024-05-19 MED ORDER — VASCEPA 1 G PO CAPS: 2.0000 g | ORAL_CAPSULE | Freq: Two times a day (BID) | ORAL | 3 refills | Status: AC

## 2024-05-19 NOTE — Telephone Encounter (Signed)
 Pharmacy Patient Advocate Encounter   Received notification from Physician's Office that prior authorization for Vascepa BRAND- use DAW 9  is required/requested.   Insurance verification completed.   The patient is insured through [place].   Per test claim: The current 05/19/24 day co-pay is, $0.00- one month.  No PA needed at this time. This test claim was processed through [facility]- copay amounts may vary at other pharmacies due to pharmacy/plan contracts, or as the patient moves through the different stages of their insurance plan.

## 2024-05-19 NOTE — Telephone Encounter (Signed)
 Vascepa 2 grams BID ordered. Please complete PA if required.

## 2024-05-21 ENCOUNTER — Other Ambulatory Visit (HOSPITAL_BASED_OUTPATIENT_CLINIC_OR_DEPARTMENT_OTHER): Payer: Self-pay | Admitting: *Deleted

## 2024-05-29 ENCOUNTER — Other Ambulatory Visit: Payer: Self-pay | Admitting: Emergency Medicine

## 2024-05-29 DIAGNOSIS — E1165 Type 2 diabetes mellitus with hyperglycemia: Secondary | ICD-10-CM

## 2024-05-30 ENCOUNTER — Other Ambulatory Visit: Payer: Self-pay | Admitting: Emergency Medicine

## 2024-05-30 DIAGNOSIS — I152 Hypertension secondary to endocrine disorders: Secondary | ICD-10-CM

## 2024-06-22 ENCOUNTER — Ambulatory Visit: Admitting: Emergency Medicine

## 2024-07-05 ENCOUNTER — Ambulatory Visit: Admitting: Cardiovascular Disease

## 2024-07-12 ENCOUNTER — Ambulatory Visit: Admitting: Cardiovascular Disease

## 2024-07-12 ENCOUNTER — Encounter: Payer: Self-pay | Admitting: Cardiovascular Disease

## 2024-07-12 VITALS — BP 165/79 | HR 88 | Ht 61.0 in | Wt 108.0 lb

## 2024-07-12 DIAGNOSIS — I739 Peripheral vascular disease, unspecified: Secondary | ICD-10-CM | POA: Insufficient documentation

## 2024-07-12 MED ORDER — CILOSTAZOL 50 MG PO TABS: 50.0000 mg | ORAL_TABLET | Freq: Two times a day (BID) | ORAL | 1 refills | Status: AC

## 2024-07-12 NOTE — Patient Instructions (Signed)
 Medication Instructions:  Your physician has recommended you make the following change in your medication:   -Start cilostazol  (Pletal ) 50mg  twice daily.  *If you need a refill on your cardiac medications before your next appointment, please call your pharmacy*  Testing/Procedures: Your physician has requested that you have an Aorta/Iliac Duplex. This will take place at [address] 4th floor  No food after 11PM the night before.  Water is OK. (Don't drink liquids if you have been instructed not to for ANOTHER test) Avoid foods that produce bowel gas, for 24 hours prior to exam (see below). No breakfast, no chewing gum, no smoking or carbonated beverages. Patient may take morning medications with water. Come in for test at least 15 minutes early to register.  Please note: We ask at that you not bring children with you during ultrasound (echo/ vascular) testing. Due to room size and safety concerns, children are not allowed in the ultrasound rooms during exams. Our front office staff cannot provide observation of children in our lobby area while testing is being conducted. An adult accompanying a patient to their appointment will only be allowed in the ultrasound room at the discretion of the ultrasound technician under special circumstances. We apologize for any inconvenience.    Your physician has requested that you have a lower extremity arterial duplex. During this test, ultrasound is used to evaluate arterial blood flow in the legs. Allow one hour for this exam. There are no restrictions or special instructions. This will take place at [address] 4th floor  Please note: We ask at that you not bring children with you during ultrasound (echo/ vascular) testing. Due to room size and safety concerns, children are not allowed in the ultrasound rooms during exams. Our front office staff cannot provide observation of children in our lobby area while testing is being conducted. An adult  accompanying a patient to their appointment will only be allowed in the ultrasound room at the discretion of the ultrasound technician under special circumstances. We apologize for any inconvenience.  Your physician has requested that you have an ankle brachial index (ABI). During this test an ultrasound and blood pressure cuff are used to evaluate the arteries that supply the arms and legs with blood. Allow thirty minutes for this exam. There are no restrictions or special instructions. This will take place at [address] 4th floor   Please note: We ask at that you not bring children with you during ultrasound (echo/ vascular) testing. Due to room size and safety concerns, children are not allowed in the ultrasound rooms during exams. Our front office staff cannot provide observation of children in our lobby area while testing is being conducted. An adult accompanying a patient to their appointment will only be allowed in the ultrasound room at the discretion of the ultrasound technician under special circumstances. We apologize for any inconvenience.   Follow-Up: At [facility], you and your health needs are our priority.  As part of our continuing mission to provide you with exceptional heart care, our providers are all part of one team.  This team includes your primary Cardiologist (physician) and Advanced Practice Providers or APPs (Physician Assistants and Nurse Practitioners) who all work together to provide you with the care you need, when you need it.  Your next appointment:   3 month(s)  Provider:   Dorn Lesches, MD   We recommend signing up for the patient portal called MyChart.  Sign up information is provided  on this After Visit Summary.  MyChart is used to connect with patients for Virtual Visits (Telemedicine).  Patients are able to view lab/test results, encounter notes, upcoming appointments, etc.  Non-urgent messages can be sent to your provider as well.   To  learn more about what you can do with MyChart, go to [email or web address].   Other Instructions

## 2024-07-12 NOTE — Assessment & Plan Note (Signed)
 Ms. Cathy Robbins was referred to me by [name] Booty, NP because of symptomatic PAD.  She has had left greater than right lower extremity claudication for several years.  She did have a wound on her left foot which has since healed.  She had Doppler studies performed in our office 05/18/2024 revealing a right ABI of 0.73 and a left of 0.47.  She has nonpalpable pedal pulses.  I am going to get aortoiliac and lower extremity arterial Doppler studies to further evaluate the extent and location of her disease and we will empirically begin her on Pletal  50 mg p.o. twice daily.  We will see her back in 3 months for follow-up.

## 2024-07-12 NOTE — Progress Notes (Signed)
 "     07/12/2024 Cathy Robbins   16-Jan-1956  [id number]  Primary Physician Cathy Emil Schanz, MD Primary Cardiologist: Cathy JINNY Lesches MD Cathy Robbins, [place]  HPI:  Cathy Robbins is a 69 y.o. thin-appearing married Vietnamese female mother of 3 children, grandmother of 5 grandchildren who is accompanied by her daughter Cathy Robbins today.  There is also Vietnamese interpreter present.  She was referred by Cathy Robbins , NP for evaluation of PAD.  The patient works in recruitment consultant at [employer].  Her risk factors include treated hypertension, diabetes and hyperlipidemia.  She is never had a heart attack or stroke.  She denies chest pain or shortness of breath.  She does walk a lot for work and complains of left greater than right lower extremity claudication.  She did have a wound on her left heel which apparently has healed over the last 6 months.  She had ABIs performed 05/18/2024 which revealed a left ABI of 0.47 and a right of 0.73.   Active Medications[1]   Allergies[2]  Social History   Socioeconomic History   Marital status: Married    Spouse name: Not on file   Number of children: 4   Years of education: Not on file   Highest education level: Not on file  Occupational History   Not on file  Tobacco Use   Smoking status: Never    Passive exposure: Never   Smokeless tobacco: Never  Vaping Use   Vaping status: Never Used  Substance and Sexual Activity   Alcohol  use: No   Drug use: No   Sexual activity: Not Currently  Other Topics Concern   Not on file  Social History Narrative   Not on file   Social Drivers of Health   Tobacco Use: Low Risk (07/12/2024)   Patient History    Smoking Tobacco Use: Never    Smokeless Tobacco Use: Never    Passive Exposure: Never  Financial Resource Strain: Not on file  Food Insecurity: No Food Insecurity (09/13/2022)   Hunger Vital Sign    Worried About Running Out of Food in the Last Year: Never true    Ran Out of Food in the Last Year:  Never true  Transportation Needs: No Transportation Needs (09/13/2022)   PRAPARE - Administrator, Civil Service (Medical): No    Lack of Transportation (Non-Medical): No  Physical Activity: Not on file  Stress: Not on file  Social Connections: Not on file  Intimate Partner Violence: Not At Risk (09/13/2022)   Humiliation, Afraid, Rape, and Kick questionnaire    Fear of Current or Ex-Partner: No    Emotionally Abused: No    Physically Abused: No    Sexually Abused: No  Depression (PHQ2-9): Low Risk (12/22/2023)   Depression (PHQ2-9)    PHQ-2 Score: 0  Alcohol  Screen: Not on file  Housing: Low Risk (09/13/2022)   Housing    Last Housing Risk Score: 0  Utilities: Not At Risk (09/13/2022)   AHC Utilities    Threatened with loss of utilities: No  Health Literacy: Not on file     Review of Systems: General: negative for chills, fever, night sweats or weight changes.  Cardiovascular: negative for chest pain, dyspnea on exertion, edema, orthopnea, palpitations, paroxysmal nocturnal dyspnea or shortness of breath Dermatological: negative for rash Respiratory: negative for cough or wheezing Urologic: negative for hematuria Abdominal: negative for nausea, vomiting, diarrhea, bright red blood per rectum, melena, or hematemesis Neurologic:  negative for visual changes, syncope, or dizziness All other systems reviewed and are otherwise negative except as noted above.    Blood pressure (!) 165/79, pulse 88, height 5' 1 (1.549 m), weight 108 lb (49 kg), SpO2 98%.  General appearance: alert and no distress Neck: no adenopathy, no carotid bruit, no JVD, supple, symmetrical, trachea midline, and thyroid not enlarged, symmetric, no tenderness/mass/nodules Lungs: clear to auscultation bilaterally Heart: regular rate and rhythm, S1, S2 normal, no murmur, click, rub or gallop Extremities: extremities normal, atraumatic, no cyanosis or edema Pulses: Absent pedal pulses Skin: Skin color,  texture, turgor normal. No rashes or lesions Neurologic: Grossly normal  EKG not performed today      ASSESSMENT AND PLAN:   Peripheral arterial disease Cathy Robbins was referred to me by Cathy Booty, NP because of symptomatic PAD.  She has had left greater than right lower extremity claudication for several years.  She did have a wound on her left foot which has since healed.  She had Doppler studies performed in our office 05/18/2024 revealing a right ABI of 0.73 and a left of 0.47.  She has nonpalpable pedal pulses.  I am going to get aortoiliac and lower extremity arterial Doppler studies to further evaluate the extent and location of her disease and we will empirically begin her on Pletal  50 mg p.o. twice daily.  We will see her back in 3 months for follow-up.     Cathy Cathy Lesches MD FACP,FACC,FAHA, FSCAI 07/12/2024 9:05 AM    [1]  Current Meds  Medication Sig   acetaminophen -codeine  (TYLENOL  #3) 300-30 MG tablet Take 1 tablet by mouth at bedtime as needed for moderate pain (pain score 4-6).   Evolocumab  (REPATHA  SURECLICK) 140 MG/ML SOAJ Inject 140 mg into the skin every 14 (fourteen) days.   FARXIGA  10 MG TABS tablet TAKE 1 TABLET BY MOUTH DAILY BEFORE BREAKFAST.   glipiZIDE  (GLUCOTROL ) 5 MG tablet TAKE 1 TABLET (5 MG TOTAL) BY MOUTH TWICE A DAY BEFORE MEALS   icosapent  Ethyl (VASCEPA ) 1 g capsule Take 2 capsules (2 g total) by mouth 2 (two) times daily.   lisinopril  (ZESTRIL ) 20 MG tablet TAKE 1 TABLET BY MOUTH EVERY DAY   rosuvastatin  (CRESTOR ) 20 MG tablet Take 1 tablet (20 mg total) by mouth daily.  [2] No Known Allergies  "

## 2024-08-20 ENCOUNTER — Ambulatory Visit (HOSPITAL_COMMUNITY)

## 2024-10-11 ENCOUNTER — Ambulatory Visit: Admitting: Cardiovascular Disease
